# Patient Record
Sex: Male | Born: 1970 | Race: White | Hispanic: No | Marital: Single | Smoking: Never smoker
Health system: Southern US, Community
[De-identification: ages and names within clinical notes are randomized; demographics above are authoritative.]

## PROBLEM LIST (undated history)

## (undated) DIAGNOSIS — Z9889 Other specified postprocedural states: Secondary | ICD-10-CM

## (undated) DIAGNOSIS — I483 Typical atrial flutter: Secondary | ICD-10-CM

## (undated) DIAGNOSIS — E785 Hyperlipidemia, unspecified: Secondary | ICD-10-CM

## (undated) DIAGNOSIS — Z8719 Personal history of other diseases of the digestive system: Secondary | ICD-10-CM

## (undated) DIAGNOSIS — I442 Atrioventricular block, complete: Secondary | ICD-10-CM

## (undated) DIAGNOSIS — I5022 Chronic systolic (congestive) heart failure: Secondary | ICD-10-CM

## (undated) DIAGNOSIS — R001 Bradycardia, unspecified: Secondary | ICD-10-CM

## (undated) HISTORY — DX: Other specified postprocedural states: Z98.890

## (undated) HISTORY — DX: Bradycardia, unspecified: R00.1

## (undated) HISTORY — DX: Typical atrial flutter: I48.3

## (undated) HISTORY — DX: Personal history of other diseases of the digestive system: Z87.19

---

## 2010-07-16 HISTORY — PX: THROAT SURGERY: SHX803

## 2010-08-19 ENCOUNTER — Emergency Department (HOSPITAL_COMMUNITY)
Admission: EM | Admit: 2010-08-19 | Discharge: 2010-08-19 | Disposition: A | Discharge status: Other Acute Inpatient Hospital | Payer: BC Managed Care – PPO | Attending: Emergency Medicine | Admitting: Emergency Medicine

## 2010-08-19 ENCOUNTER — Inpatient Hospital Stay (HOSPITAL_COMMUNITY)
Admit: 2010-08-19 | Discharge: 2010-08-21 | DRG: 188 | Disposition: A | Discharge status: Home / Self Care | Payer: BC Managed Care – PPO | Attending: Cardiology | Admitting: Cardiology

## 2010-08-19 ENCOUNTER — Emergency Department (HOSPITAL_COMMUNITY): Payer: BC Managed Care – PPO

## 2010-08-19 ENCOUNTER — Inpatient Hospital Stay: Admission: EM | Admit: 2010-08-19 | Payer: Self-pay | Admitting: Cardiology

## 2010-08-19 DIAGNOSIS — K222 Esophageal obstruction: Secondary | ICD-10-CM | POA: Diagnosis present

## 2010-08-19 DIAGNOSIS — T18108A Unspecified foreign body in esophagus causing other injury, initial encounter: Principal | ICD-10-CM | POA: Diagnosis present

## 2010-08-19 DIAGNOSIS — R1310 Dysphagia, unspecified: Secondary | ICD-10-CM

## 2010-08-19 DIAGNOSIS — I498 Other specified cardiac arrhythmias: Secondary | ICD-10-CM | POA: Diagnosis present

## 2010-08-19 DIAGNOSIS — I4892 Unspecified atrial flutter: Secondary | ICD-10-CM | POA: Insufficient documentation

## 2010-08-19 DIAGNOSIS — Z8249 Family history of ischemic heart disease and other diseases of the circulatory system: Secondary | ICD-10-CM

## 2010-08-19 DIAGNOSIS — IMO0002 Reserved for concepts with insufficient information to code with codable children: Secondary | ICD-10-CM | POA: Insufficient documentation

## 2010-08-19 DIAGNOSIS — Y92009 Unspecified place in unspecified non-institutional (private) residence as the place of occurrence of the external cause: Secondary | ICD-10-CM

## 2010-08-19 LAB — COMPREHENSIVE METABOLIC PANEL
BUN: 19 mg/dL (ref 6–23)
CO2: 29 mEq/L (ref 19–32)
Calcium: 9.9 mg/dL (ref 8.4–10.5)
Creatinine, Ser: 1.21 mg/dL (ref 0.4–1.5)
GFR calc non Af Amer: >60 mL/min (ref >60)
Glucose, Bld: 83 mg/dL (ref 70–99)
Total Protein: 7.8 g/dL (ref 6.0–8.3)

## 2010-08-19 LAB — DIFFERENTIAL
Basophils Absolute: 0 10*3/uL (ref 0.0–0.1)
Basophils Relative: 0 % (ref 0–1)
Eosinophils Relative: 2 % (ref 0–5)
Lymphocytes Relative: 25 % (ref 12–46)
Monocytes Absolute: 0.5 10*3/uL (ref 0.1–1.0)
Monocytes Relative: 6 % (ref 3–12)
Neutro Abs: 5.7 10*3/uL (ref 1.7–7.7)

## 2010-08-19 LAB — APTT: aPTT: 31 seconds (ref 24–37)

## 2010-08-19 LAB — TROPONIN I: Troponin I: 0.04 ng/mL (ref 0.00–0.06)

## 2010-08-19 LAB — LIPASE, BLOOD: Lipase: 27 U/L (ref 11–59)

## 2010-08-19 LAB — CBC
HCT: 47 % (ref 39.0–52.0)
Hemoglobin: 16.5 g/dL (ref 13.0–17.0)
MCH: 30.8 pg (ref 26.0–34.0)
MCHC: 35.1 g/dL (ref 30.0–36.0)
RDW: 12.5 % (ref 11.5–15.5)

## 2010-08-19 LAB — CK TOTAL AND CKMB (NOT AT ARMC): Total CK: 226 U/L (ref 7–232)

## 2010-08-19 LAB — PROTIME-INR
INR: 1.03 (ref 0.00–1.49)
Prothrombin Time: 13.7 seconds (ref 11.6–15.2)

## 2010-08-20 ENCOUNTER — Encounter: Payer: Self-pay | Admitting: Cardiology

## 2010-08-20 DIAGNOSIS — K222 Esophageal obstruction: Secondary | ICD-10-CM

## 2010-08-20 DIAGNOSIS — T18108A Unspecified foreign body in esophagus causing other injury, initial encounter: Secondary | ICD-10-CM

## 2010-08-20 DIAGNOSIS — I517 Cardiomegaly: Secondary | ICD-10-CM

## 2010-08-20 DIAGNOSIS — R1310 Dysphagia, unspecified: Secondary | ICD-10-CM

## 2010-08-20 LAB — CARDIAC PANEL(CRET KIN+CKTOT+MB+TROPI)
CK, MB: 9.2 ng/mL (ref 0.3–4.0)
CK, MB: 7.9 ng/mL (ref 0.3–4.0)
Relative Index: 3.7 — ABNORMAL HIGH (ref 0.0–2.5)
Total CK: 213 U/L (ref 7–232)

## 2010-08-20 LAB — LIPID PANEL
Cholesterol: 179 mg/dL (ref 0–200)
HDL: 45 mg/dL (ref >39)
LDL Cholesterol: 128 mg/dL — ABNORMAL HIGH (ref 0–99)
Total CHOL/HDL Ratio: 4 RATIO
Triglycerides: 31 mg/dL (ref <150)

## 2010-08-21 NOTE — Assessment & Plan Note (Signed)
[facility]                                ON-CALL NOTE  [name], [name]                         MRN:          [id number] DATE:08/19/2010                            DOB:          27-Dec-1970   Ricardo Jones is not a patient in the practice, but called at the recommendation of his girlfriend.  He has a history of recurrent dysphagia and what sounds like food impactions.  Over the last few hours, he has been unable to eat or drink anything suggestive of recurrent impaction.  He was instructed to go to the emergency room where he can be evaluated.    Barbette Hair. Arlyce Dice, MD,FACG    RDK/MedQ  DD: 08/19/2010  DT: 08/20/2010  Job #: [id number]

## 2010-08-26 NOTE — H&P (Signed)
NAME:  MARRIS, FRONTERA NO.:  [id number]  MEDICAL RECORD NO.:  [id number]           PATIENT TYPE:  E  LOCATION:  WLED                         FACILITY:  [facility]  PHYSICIAN:  Luis Abed, MD, FACCDATE OF BIRTH:  [birth year]  DATE OF ADMISSION:  08/19/2010 DATE OF DISCHARGE:                             HISTORY & PHYSICAL   HISTORY OF PRESENT ILLNESS:  The patient is a healthy-appearing 40 year old gentleman.  He has a strong family history of coronary artery disease.  He has never had significant chest pain.  He exercises on a very regular basis with a vigorous program.  He has had some problems with swallowing.  He came to the emergency room today with dysphagia. Plans were being made for him to have endoscopy.  It was then noted that he had atrial flutter with a slow rate.  Decision was made to ask me to evaluate him first.  He does have atrial flutter with a slow rate.  In addition, he has inferior Q-waves and anterior Q-waves.  It is possible that he has had inferior and anterior MI in an unknown time.  He is clinically stable at this time.  ALLERGIES:  No known drug allergies.  MEDICATIONS:  He is on no significant medications.  SOCIAL HISTORY:  The patient does not smoke.  FAMILY HISTORY:  There is a strong family history of coronary artery disease.  REVIEW OF SYSTEMS:  The patient denies fever, chills, headache, sweats, rash, change in vision, change in hearing, chest pain, cough, nausea, vomiting, or urinary symptoms.  All other systems are reviewed and are negative.  PHYSICAL EXAMINATION:  GENERAL:  The patient is very healthy appearing. VITAL SIGNS:  Blood pressure is 134/72.  Pulse is 66 with a respirations 18. HEENT:  Head is atraumatic.  There is no xanthelasma. NECK:  There are no carotid bruits.  There is no jugular venous distention. LUNGS:  Clear.  Respiratory effort is not labored. CARDIAC EXAM:  Reveals S1 and S2.  There is a 2/6  systolic murmur. GASTROINTESTINAL:  The abdomen is soft. EXTREMITIES:  There is no peripheral edema.  LABORATORY DATA:  EKG reveals atrial flutter with an extremely low rate of 31.  He has inferior Q-waves and anterior Q-waves.  This must be read as cannot rule out, possible old inferior and anterior infarcts.  At this time, blood work is pending.  Chest x-ray also is pending.  PROBLEMS: 1. History of dysphagia.  He needs to have endoscopy at some point,     but he is stable.  This can be put off for today. 2. Atrial flutter with very slow rate.  The patient has always had a     slow rate and probably tolerates this rhythm.  He will need further     electrophysiologic evaluation. 3. 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  He needs 2-D echocardiogram to assess his     LV function.  PLAN:  Considering all the issues at hand, it is very difficult to decide how to proceed with which  procedure first.  It is necessary for him to have an endoscopy at some point.  I am not comfortable with proceeding with this, until we know more about his cardiac status. Therefore, he will be admitted and monitored.  His myocardial infarction will be ruled out.  He will have a 2-D echocardiogram.  He may well need to have cardiac catheterization.  As we are doing these studies, we will integrate the rest of his GI workup.     Luis Abed, MD, [facility]     JDK/MEDQ  D:  08/19/2010  T:  08/19/2010  Job:  [id number]  Electronically Signed by Willa Rough MD FACC on 08/21/2010 01:49:06 PM

## 2010-08-30 NOTE — Consult Note (Signed)
NAME:  [name], [name] NO.:  [id number]  MEDICAL RECORD NO.:  [id number]           PATIENT TYPE:  LOCATION:                                 FACILITY:  PHYSICIAN:  Hillis Range, MD       DATE OF BIRTH:  03-06-1971  DATE OF CONSULTATION: DATE OF DISCHARGE:                                CONSULTATION   REQUESTING PHYSICIAN:  Luis Abed, MD, FACC  REASON FOR CONSULTATION:  Bradycardia and atrial flutter.  HISTORY OF PRESENT ILLNESS:  Ricardo Jones is a pleasant 40 year old gentleman who is admitted with GI impaction.  He states that he has had dysphagia for some time.  He developed progressive to food impaction requiring admission to [facility] on August 19, 2010.  He was evaluated by GI and found to have a stricture at the gastroesophageal junction which was ballooned and dilated.  Interestingly, upon initial presentation, the patient was noted to have bradycardia with heart rates in the 30s.  An EKG at that time revealed atrial flutter.  The patient was also noted to have an anteroseptal and inferior infarction pattern on EKG.  He is therefore admitted to the Cardiology Service and evaluated.  The patient had an echocardiogram performed on August 20, 2010, which revealed a normal left ventricular ejection fraction with no wall motion abnormalities.  There were no significant valvular abnormalities.  The left atrial size was 43 mm.  The right ventricle was not well seen.  The patient was observed on telemetry to have persistent atrial flutter with heart rates predominantly in the 30s.  With ambulation, the patient's heart rate quickly increased into the 60s and 70s.  The patient reports having longstanding low heart rate.  He feels that his heart has always been "slow."  He has not had prior EKGs pain per his knowledge.  The patient is very active.  He exercises 5 days a week without limitation.  He denies chest pain, palpitations,  presyncope, syncope, dizziness, or fatigue.  He is otherwise without complaint at this time.  PAST MEDICAL HISTORY:  Esophageal stricture as above.  MEDICATIONS:  None known.  ALLERGIES:  None.  SOCIAL HISTORY:  The patient denies tobacco use.  He is self-employed. He drinks occasional alcohol.  FAMILY HISTORY:  The patient's mother had initially atrial flutter and subsequently required a pacemaker following atrial flutter ablation.  He now has atrial fibrillation.  The patient has a maternal uncle who also has atrial arrhythmias and a pacemaker.  He has multiple cousins who have required pacemaker implantation in their 30s.  REVIEW OF SYSTEMS:  All systems are reviewed and negative except as outlined in the HPI above.  PHYSICAL EXAMINATION:  TELEMETRY:  Atrial flutter with ventricular rates in the 30s-40s. VITAL SIGNS:  Blood pressure 104/54, heart rate 29, respirations 18, sats 98% on room air, and afebrile. GENERAL:  The patient is a fit and well-appearing male in no acute distress.  He is ambulating in the halls without difficulty.  He is alert and oriented x3. HEENT:  Normocephalic and atraumatic.  Sclerae are clear.  Conjunctivae are  pink.  Oropharynx is clear. NECK:  Supple.  No thyromegaly, JVD, or bruits. LUNGS:  Clear to auscultation bilaterally. HEART:  Bradycardic, regular rhythm.  No murmurs, rubs, or gallops. GI:  soft, nontender, and nondistended with positive bowel sounds. EXTREMITIES:  No clubbing, cyanosis, or edema. SKIN:  No ecchymoses or lacerations. MUSCULOSKELETAL:  No deformity or atrophy. PSYCH:  Euthymic mood.  Full affect.  EKG reveals typical-appearing atrial flutter with a ventricular rate of 28 beats per minute.  An inferior and anteroseptal infarction pattern is observed.  The QRS duration is 90 milliseconds.  LABORATORY DATA:  White blood cell count 8.5, hematocrit 47, and platelets 185.  TSH 1.3, potassium 5.1, and creatinine 1.2.   Cardiac markers are negative.  IMPRESSION:  Ricardo Jones is a pleasant 40 year old gentleman who presents with a symptomatic esophageal stricture which has now been dilated.  He has also been found to have asymptomatic atrial flutter with bradycardia.  He has a family history which is suggestive of hereditary atrial arrhythmias and bradycardia.  Interestingly, the patient is completely asymptomatic and is ambulating in the halls with an appropriate acceleration in heart rate.  PLAN:  Therapeutic strategies for atrial flutter and also bradycardia were discussed at length with the patient today.  As he is asymptomatic and has a CHADS-VASc score of 0, I do not feel that medical therapy or ablation is required for his atrial flutter at this time.  He is also asymptomatic with atrial flutter and therefore we will follow the patient closely, but not going to proceed with pacing at this time.  The patient would prefer this conservative approach which I think is reasonable.  As his CHADS-VASc score is 0 the recommendation would be either be for nothing, possibly aspirin long-term.  Given his recent GI dilatation procedure, I think that we should hold off on aspirin.  When I see him back in the office, we may consider aspirin as an option at that time.  The patient will notify me immediately should he have any episodes of symptomatic bradycardia or syncope.     Hillis Range, MD     JA/MEDQ  D:  08/21/2010  T:  08/21/2010  Job:  [id number]  Electronically Signed by Hillis Range MD on 08/30/2010 10:22:05 PM

## 2010-09-20 ENCOUNTER — Encounter: Payer: BC Managed Care – PPO | Admitting: Internal Medicine

## 2010-09-26 NOTE — Procedures (Signed)
Summary: EGD  EGD   Imported By: Kassie Mends 09/20/2010 08:13:43  _____________________________________________________________________  External Attachment:    Type:   Image     Comment:   External Document

## 2010-09-28 ENCOUNTER — Encounter: Payer: Self-pay | Admitting: Internal Medicine

## 2010-09-28 ENCOUNTER — Encounter (INDEPENDENT_AMBULATORY_CARE_PROVIDER_SITE_OTHER): Payer: BC Managed Care – PPO | Admitting: Internal Medicine

## 2010-09-28 DIAGNOSIS — I495 Sick sinus syndrome: Secondary | ICD-10-CM

## 2010-09-28 DIAGNOSIS — I4892 Unspecified atrial flutter: Secondary | ICD-10-CM

## 2010-09-28 DIAGNOSIS — I498 Other specified cardiac arrhythmias: Secondary | ICD-10-CM | POA: Insufficient documentation

## 2010-09-29 NOTE — Discharge Summary (Signed)
NAME:  Ricardo Jones, Ricardo Jones                ACCOUNT NO.:  [id number]  MEDICAL RECORD NO.:  [id number]           PATIENT TYPE:  I  LOCATION:  3730                         FACILITY:  [facility]  PHYSICIAN:  Hillis Range, MD       DATE OF BIRTH:  Jul 30, 1970  DATE OF ADMISSION:  08/19/2010 DATE OF DISCHARGE:  08/21/2010                              DISCHARGE SUMMARY   PROCEDURES: 1. EGD. 2. Two-view chest x-ray.  PRIMARY FINAL DISCHARGE DIAGNOSES: 1. Meat bolus in the distal esophagus. 2. Stricture at the gastroesophageal junction.  SECONDARY DIAGNOSES: 1. Atrial flutter with slow ventricular response. 2. Preserved left ventricular function with atrial dilatation,     ejection fraction 60% and left atrium of 43 mm in diameter by     echocardiogram this admission. 3. Strong family history of coronary artery disease.  TIME AT DISCHARGE:  Thiry six minutes.  HOSPITAL COURSE:  Ricardo Jones is a 40 year old male with no previous cardiac issues.  He has trouble swallowing and was unable to even swallow liquids.  He came to the emergency room.  He was seen by GI and found to have a food impaction.  When his vital signs were taken, his heart rate was noted to be in the 20s.  Cardiology was asked to evaluate him prior to him being sedated.  Ricardo Jones was evaluated by Dr. Myrtis Ser and had a stat echocardiogram. His EF was normal and his left atrium was slightly enlarged.  The patient was asymptomatic with a low heart rate.  He was cleared by Dr. Myrtis Ser to have the EGD.  He required EGD with foreign body removal and dilatation.  Dr. Arlyce Dice recommended that he continue on the PPI and get dilatations in the future p.r.n.  On August 21, 2010, Ricardo Jones was eating and drinking normally.  He was seen by EP to come up with a further plan for the atrial flutter.  Dr. Johney Frame evaluated the patient and felt that he did not have any symptoms from the atrial flutter or from the bradycardia.  He was ambulating  briskly and has a history of exercising strenuously with no symptoms.  When he ambulated briskly, his heart rate increased into the 60s.  His CHADS-VASC score was zero. The therapies for atrial flutter and bradycardia were discussed at length and Ricardo Jones prefers conservative management.  Dr. Johney Frame felt that if he were cleared per GI by discharge there were no cardiac issues to keep him in the hospital and he could be safely discharged home, to follow up as an outpatient.  Aspirin can be considered at his outpatient visit.  DISCHARGE INSTRUCTIONS:  His activity level is to be increased gradually.  He is encouraged to stick to a low-sodium, heart-healthy diet, take small bites and chew thoroughly.  He is to follow up with Dr. Arlyce Dice as needed.  He is to follow up with Dr. Johney Frame on September 20, 2010, at 9:45.  DISCHARGE MEDICATIONS:  Protonix 40 mg daily.     Theodore Demark, PA-C   ______________________________ Hillis Range, MD    RB/MEDQ  D:  08/21/2010  T:  08/21/2010  Job:  [id number]  Electronically Signed by Theodore Demark PA-C on 09/01/2010 11:59:50 AM Electronically Signed by Hillis Range MD on 09/29/2010 10:50:36 PM

## 2010-10-03 NOTE — Assessment & Plan Note (Signed)
Summary: eph/F4W/per Bjorn Loser pa/mj   Visit Type:  Follow-up   History of Present Illness: The patient presents today for routine electrophysiology followup. He reports doing very well since his recent hospital discharge.  He was admitted with upper GI food impaction requiring dilitation.  He was found to have atrial flutter with V rates 30s-40s without sypmtoms.  He reports "always having a slow heart beat".  He remains incredibly active.  Since hospital discharge, he has monitored his HR with exercise and finds that it will increase to 90s and occasionally 120s.  He does not appear to have exercise limitation and does symptoms of atrial flutter.  The patient denies symptoms of palpitations, chest pain, shortness of breath, orthopnea, PND, lower extremity edema, dizziness, presyncope, syncope, or neurologic sequela. The patient is otherwise without complaint today.   Current Medications (verified): 1)  None  Allergies (verified): No Known Drug Allergies  Past History:  Past Medical History:  1. Atrial flutter (typical) with slow ventricular response.   2. Preserved left ventricular function with atrial dilatation, ejection fraction 60% and left atrium of 43 mm in diameter by echocardiogram   3. Esophageal striction s/p dilitation  Family History:  The patient's mother had initially atrial flutter and subsequently required a pacemaker following atrial flutter ablation.  She   now has atrial fibrillation.  The patient has a maternal uncle who also has atrial arrhythmias and a pacemaker.  He has multiple cousins who have required pacemaker implantation in their 30s.   Social History: The patient denies tobacco use.  He is self-employed.   He drinks occasional alcohol.  Vital Signs:  Patient profile:   40 year old male Height:      78 inches Weight:      242 pounds BMI:     28.07 Pulse rate:   35 / minute BP sitting:   130 / 70  (left arm)  Vitals Entered By: Laurance Flatten CMA (September 28, 2010 3:28 PM)  Physical Exam  General:  Well developed, well nourished, in no acute distress. very fit Head:  normocephalic and atraumatic Eyes:  PERRLA/EOM intact; conjunctiva and lids normal. Mouth:  Teeth, gums and palate normal. Oral mucosa normal. Neck:  Neck supple, no JVD. No masses, thyromegaly or abnormal cervical nodes. Lungs:  Clear bilaterally to auscultation and percussion. Heart:  bradycardic irregular rhythm, no m/r/g Abdomen:  Bowel sounds positive; abdomen soft and non-tender without masses, organomegaly, or hernias noted. No hepatosplenomegaly. Msk:  Back normal, normal gait. Muscle strength and tone normal. Extremities:  No clubbing or cyanosis. Neurologic:  Alert and oriented x 3.   EKG  Procedure date:  09/28/2010  Findings:      typical appearing atrial flutter, V rates 35, septal infarction, septal infarction  Impression & Recommendations:  Problem # 1:  ATRIAL FLUTTER (ICD-427.32) The patient has typical appearing atrial flutter but without any symptoms.  His CHADSVASC score is 0.  He will try ASA 81mg  daily for stroke prevention.  He is clear that he wishes to avoid ablation.  As he is asymptomatic, this is a reasonable approach.  Problem # 2:  BRADYCARDIA, CHRONIC (ICD-427.89) The patient has longstanding bradycardia without symptoms.  He is able to mount a reasonable heart rate response to exercise.  I suspect that his bradycardia is most likely due to very high vegal tone, though he reports multiple maternal family memebers which have required pacemakers.  He may therefore have intrinsic conduction disease.  We will follow for  now as he is completely asymptomatic and able to mount heart rates with exercise, though eventually, he will likely require PPM.  Patient Instructions: 1)  Your physician wants you to follow-up in: 12 months with Dr Jacquiline Doe will receive a reminder letter in the mail two months in advance. If you don't receive a letter, please  call our office to schedule the follow-up appointment. 2)  Your physician recommends that you continue on your current medications as directed. Please refer to the Current Medication list given to you today.

## 2011-01-31 ENCOUNTER — Encounter: Payer: Self-pay | Admitting: Gastroenterology

## 2011-03-21 ENCOUNTER — Ambulatory Visit: Payer: BC Managed Care – PPO | Admitting: Gastroenterology

## 2011-10-22 ENCOUNTER — Ambulatory Visit: Payer: BC Managed Care – PPO

## 2011-11-08 ENCOUNTER — Ambulatory Visit (INDEPENDENT_AMBULATORY_CARE_PROVIDER_SITE_OTHER): Payer: BC Managed Care – PPO | Admitting: Internal Medicine

## 2011-11-08 ENCOUNTER — Encounter: Payer: Self-pay | Admitting: Internal Medicine

## 2011-11-08 VITALS — BP 130/72 | HR 32 | Resp 18 | Ht 78.0 in | Wt 250.1 lb

## 2011-11-08 DIAGNOSIS — I4892 Unspecified atrial flutter: Secondary | ICD-10-CM

## 2011-11-08 DIAGNOSIS — I498 Other specified cardiac arrhythmias: Secondary | ICD-10-CM

## 2011-11-08 NOTE — Assessment & Plan Note (Signed)
Asymptomatic CHADSVASC score is 0. He declines ablation and is well rate controlled No changes today

## 2011-11-08 NOTE — Patient Instructions (Signed)
Your physician wants you to follow-up in: 12 months with Dr Allred You will receive a reminder letter in the mail two months in advance. If you don't receive a letter, please call our office to schedule the follow-up appointment.   Your physician has requested that you have an echocardiogram. Echocardiography is a painless test that uses sound waves to create images of your heart. It provides your doctor with information about the size and shape of your heart and how well your heart's chambers and valves are working. This procedure takes approximately one hour. There are no restrictions for this procedure.     

## 2011-11-08 NOTE — Assessment & Plan Note (Signed)
Asymptomatic Heart rate adequately increases with activity. He is clear that he does not want further intervention  We will repeat an echo at this time. He will contact my office if he develops any symptoms.

## 2011-11-08 NOTE — Progress Notes (Signed)
  The patient presents today for routine electrophysiology followup.  Since last being seen in our clinic, the patient reports doing very well.  He remains very active and continues to rigorously exercise without limitation.  He is asymptomatic with atrial flutter and bradycardia.  Today, he denies symptoms of palpitations, chest pain, shortness of breath, lower extremity edema, dizziness, presyncope, syncope, or neurologic sequela.   Past Medical History  Diagnosis Date  . Typical atrial flutter   . Bradycardia     asymptomatic  . S/P dilatation of esophageal stricture    Medicines- none  No Known Allergies  History   Social History  . Marital Status: Single    Spouse Name: N/A    Number of Children: N/A  . Years of Education: N/A   Occupational History  . Not on file.   Social History Main Topics  . Smoking status: Never Smoker   . Smokeless tobacco: Not on file  . Alcohol Use: No  . Drug Use: No  . Sexually Active: Not on file   Other Topics Concern  . 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 Bradycardia      multiple family members with pacemakers   Physical Exam: Filed Vitals:   11/08/11 1433  BP: 130/72  Pulse: 32  Resp: 18  Height: 6\' 6"  (1.981 m)  Weight: 250 lb 1.9 oz (113.454 kg)    GEN- The patient is well appearing, alert and oriented x 3 today.   Head- normocephalic, atraumatic Eyes-  Sclera clear, conjunctiva pink Ears- hearing intact Oropharynx- clear Lungs- Clear to ausculation bilaterally, normal work of breathing Heart- bradycardic regular rhythm, no murmurs, rubs or gallops, PMI not laterally displaced GI- soft, NT, ND, + BS Extremities- no clubbing, cyanosis, or edema  ekg today reveals typical appearing atrial flutter, V rate 32 bpm, poor R wave progression  Assessment and Plan:

## 2011-11-19 ENCOUNTER — Ambulatory Visit (HOSPITAL_COMMUNITY): Payer: BC Managed Care – PPO

## 2011-11-27 ENCOUNTER — Other Ambulatory Visit (HOSPITAL_COMMUNITY): Payer: BC Managed Care – PPO

## 2011-12-03 ENCOUNTER — Telehealth: Payer: Self-pay | Admitting: *Deleted

## 2011-12-03 NOTE — Telephone Encounter (Signed)
Called Mr. Ricardo Jones to r/s echo appointment that he resheduled/no-showed on 11/19/11 2 10;30 and 11/27/11 2 10;30. Patient stated he just got married over the weekend and he will be out-of-tpwn, until 01/16/12. He would like for me to call him back on 01/16/12. sl

## 2012-04-01 ENCOUNTER — Other Ambulatory Visit: Payer: Self-pay | Admitting: *Deleted

## 2012-04-01 DIAGNOSIS — C9000 Multiple myeloma not having achieved remission: Secondary | ICD-10-CM

## 2012-04-01 DIAGNOSIS — Z09 Encounter for follow-up examination after completed treatment for conditions other than malignant neoplasm: Secondary | ICD-10-CM

## 2012-04-02 ENCOUNTER — Telehealth: Payer: Self-pay | Admitting: *Deleted

## 2012-04-02 NOTE — Telephone Encounter (Signed)
Per central scheduling to orders needs to be put back into the system

## 2012-04-02 NOTE — Telephone Encounter (Signed)
Error in charting.

## 2012-11-12 ENCOUNTER — Encounter: Payer: Self-pay | Admitting: Internal Medicine

## 2012-11-12 ENCOUNTER — Ambulatory Visit (INDEPENDENT_AMBULATORY_CARE_PROVIDER_SITE_OTHER): Payer: BC Managed Care – PPO | Admitting: Internal Medicine

## 2012-11-12 VITALS — BP 108/82 | HR 40 | Ht 79.0 in | Wt 254.2 lb

## 2012-11-12 DIAGNOSIS — I4892 Unspecified atrial flutter: Secondary | ICD-10-CM

## 2012-11-12 DIAGNOSIS — I498 Other specified cardiac arrhythmias: Secondary | ICD-10-CM

## 2012-11-12 NOTE — Patient Instructions (Addendum)
Your physician wants you to follow-up in: 12 months with Dr Allred You will receive a reminder letter in the mail two months in advance. If you don't receive a letter, please call our office to schedule the follow-up appointment.  

## 2012-11-12 NOTE — Progress Notes (Signed)
  The patient presents today for routine electrophysiology followup.  Since last being seen in our clinic, the patient reports doing very well.  He remains very active and continues to rigorously exercise without limitation.  He is asymptomatic with atrial flutter and bradycardia.  Today, he denies symptoms of palpitations, chest pain, shortness of breath, lower extremity edema, dizziness, presyncope, syncope, or neurologic sequela.   Past Medical History  Diagnosis Date  . Typical atrial flutter   . Bradycardia     asymptomatic  . S/P dilatation of esophageal stricture    Medicines- none  No Known Allergies  History   Social History  . Marital Status: Single    Spouse Name: N/A    Number of Children: N/A  . Years of Education: N/A   Occupational History  . Not on file.   Social History Main Topics  . Smoking status: Never Smoker   . Smokeless tobacco: Not on file  . Alcohol Use: No  . Drug Use: No  . Sexually Active: Not on file   Other Topics Concern  . 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 Bradycardia      multiple family members with pacemakers   Physical Exam: Filed Vitals:   11/12/12 1023  BP: 108/82  Pulse: 40  Height: 6\' 7"  (2.007 m)  Weight: 254 lb 3.2 oz (115.304 kg)    GEN- The patient is well appearing, alert and oriented x 3 today.   Head- normocephalic, atraumatic Eyes-  Sclera clear, conjunctiva pink Ears- hearing intact Oropharynx- clear Lungs- Clear to ausculation bilaterally, normal work of breathing Heart- bradycardic regular rhythm, no murmurs, rubs or gallops, PMI not laterally displaced GI- soft, NT, ND, + BS Extremities- no clubbing, cyanosis, or edema  ekg today reveals typical appearing atrial flutter, V rate 34 bpm, poor R wave progression  Assessment and Plan:  1. Atrial  flutter Asymptomatic chads2vasc score is 0 No changes at this time  2. Bradycardia Asymptomatic His heart rates increases with activity appropriately No indication for pacing at this time

## 2014-01-13 ENCOUNTER — Encounter: Payer: Self-pay | Admitting: Internal Medicine

## 2014-01-13 ENCOUNTER — Ambulatory Visit (INDEPENDENT_AMBULATORY_CARE_PROVIDER_SITE_OTHER): Payer: BC Managed Care – PPO | Admitting: Internal Medicine

## 2014-01-13 VITALS — BP 116/70 | HR 33 | Ht 78.75 in | Wt 250.0 lb

## 2014-01-13 DIAGNOSIS — I4892 Unspecified atrial flutter: Secondary | ICD-10-CM

## 2014-01-13 DIAGNOSIS — I498 Other specified cardiac arrhythmias: Secondary | ICD-10-CM

## 2014-01-13 NOTE — Progress Notes (Signed)
  The patient presents today for routine electrophysiology followup.  Since last being seen in our clinic, the patient reports doing very well.  He remains very active and continues to rigorously exercise without limitation.  He is asymptomatic with atrial flutter and bradycardia.  Today, he denies symptoms of palpitations, chest pain, shortness of breath, lower extremity edema, dizziness, presyncope, syncope, or neurologic sequela.   Past Medical History  Diagnosis Date  . Typical atrial flutter   . Bradycardia     asymptomatic  . S/P dilatation of esophageal stricture    Medicines- none  No Known Allergies  History   Social History  . Marital Status: Single    Spouse Name: N/A    Number of Children: N/A  . Years of Education: N/A   Occupational History  . Not on file.   Social History Main Topics  . Smoking status: Never Smoker   . Smokeless tobacco: Not on file  . Alcohol Use: No  . Drug Use: No  . Sexual Activity: Not on file   Other Topics Concern  . 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 Bradycardia      multiple family members with pacemakers   Physical Exam: Filed Vitals:   01/13/14 0955  BP: 116/70  Pulse: 33  Height: 6' 6.75" (2 m)  Weight: 113.399 kg (250 lb)    GEN- The patient is well appearing, alert and oriented x 3 today.   Head- normocephalic, atraumatic Eyes-  Sclera clear, conjunctiva pink Ears- hearing intact Oropharynx- clear Lungs- Clear to ausculation bilaterally, normal work of breathing Heart- bradycardic regular rhythm, no murmurs, rubs or gallops, PMI not laterally displaced GI- soft, NT, ND, + BS Extremities- no clubbing, cyanosis, or edema  Ekg today reveals typical appearing atrial flutter with slow ventricular response at 33 bpm.  Assessment and Plan:  1. Atrial flutter Asymptomatic.  He  remains very clear in his decision to avoid cardioversion or procedures including ablation chads2vasc score continues at 0. Surface echocardiogram is ordered to evaluate for any changes related to his atrial arrhythmia  2. Bradycardia Asymptomatic His heart rates increases with activity appropriately No indication for pacing at this time  Recheck in one year, sooner if symptomatic.

## 2014-01-13 NOTE — Patient Instructions (Signed)
Your physician wants you to follow-up in: 12 months with Dr Vallery Ridge will receive a reminder letter in the mail two months in advance. If you don't receive a letter, please call our office to schedule the follow-up appointment.   Your physician has requested that you have an echocardiogram. Echocardiography is a painless test that uses sound waves to create images of your heart. It provides your doctor with information about the size and shape of your heart and how well your heart's chambers and valves are working. This procedure takes approximately one hour. There are no restrictions for this procedure.

## 2014-02-01 ENCOUNTER — Ambulatory Visit (HOSPITAL_COMMUNITY): Payer: BC Managed Care – PPO | Attending: Cardiovascular Disease

## 2014-02-01 DIAGNOSIS — I079 Rheumatic tricuspid valve disease, unspecified: Secondary | ICD-10-CM | POA: Insufficient documentation

## 2014-02-01 DIAGNOSIS — K222 Esophageal obstruction: Secondary | ICD-10-CM | POA: Insufficient documentation

## 2014-02-01 DIAGNOSIS — I4892 Unspecified atrial flutter: Secondary | ICD-10-CM | POA: Insufficient documentation

## 2014-02-01 DIAGNOSIS — I517 Cardiomegaly: Secondary | ICD-10-CM | POA: Insufficient documentation

## 2014-02-01 DIAGNOSIS — I498 Other specified cardiac arrhythmias: Secondary | ICD-10-CM | POA: Insufficient documentation

## 2014-02-01 NOTE — Progress Notes (Signed)
2D Echo completed. 02/01/2014  

## 2014-02-10 ENCOUNTER — Telehealth: Payer: Self-pay | Admitting: Internal Medicine

## 2014-02-10 NOTE — Telephone Encounter (Addendum)
Called patient back and was explaining his test results.  He was not happy with timing of call back for results and felt the MD should have called him.  I explained that I call all test results back to all patients and unless it is critical I call as I can to give results and plan of care.  He was not happy with the recommendations given him and hung up.  Dr Rayann Heman was present during conversation with him and felt I gave him adequate information in regards to his test results

## 2014-02-10 NOTE — Telephone Encounter (Signed)
New message    Calling for test results  

## 2014-02-11 ENCOUNTER — Encounter: Payer: Self-pay | Admitting: Internal Medicine

## 2014-03-02 ENCOUNTER — Encounter: Payer: Self-pay | Admitting: Internal Medicine

## 2014-03-30 ENCOUNTER — Ambulatory Visit (INDEPENDENT_AMBULATORY_CARE_PROVIDER_SITE_OTHER): Payer: BC Managed Care – PPO | Admitting: Cardiology

## 2014-03-30 ENCOUNTER — Encounter: Payer: Self-pay | Admitting: Cardiology

## 2014-03-30 VITALS — BP 116/72 | HR 34 | Ht 79.0 in | Wt 252.0 lb

## 2014-03-30 DIAGNOSIS — I4892 Unspecified atrial flutter: Secondary | ICD-10-CM

## 2014-03-30 NOTE — Patient Instructions (Signed)
Your physician has requested that you have an exercise tolerance test. For further information please visit [email or web address]. Please also follow instruction sheet, as given.  Your physician wants you to follow-up in: 1 year ov/ekg  You will receive a reminder letter in the mail two months in advance. If you don't receive a letter, please call our office to schedule the follow-up appointment.

## 2014-03-30 NOTE — Progress Notes (Signed)
Ricardo Jones Date of Birth:  September 29, 1970 [place] [address] [place], [place]  [address] [phone]        Fax   [phone]   History of Present Illness: This pleasant 43 year old gentleman is seen by me for the first time today.  He is essentially self-referred.  [name] is our patient.  Mr. Ricardo Jones comes in for followup of his chronic atrial flutter.  His flutter was first discovered in 2013 when he presented to the emergency room with a piece of chicken caught in his throat.  He had to have anesthesia for removal of the foreign object and his preoperative EKG was abnormal and showed atrial flutter with slow ventricular response.  This was the first time that he was told that he was in atrial flutter.  The previous duration of the flutter is unknown since he was asymptomatic.  Of interest is the fact that his mother had had atrial flutter.  She went on to have a flutter ablation and with the procedure developed complete heart block and had to have a pacemaker. The patient is asymptomatic.  He exercises regularly.  He does not have any chest pain or shortness of breath.  He has not had any dizziness or syncope.  His resting heart rate is usually in the 30s.  When he goes to the gym he can get his heart rate up to about 89. His chest last score remains at 0. He had an echocardiogram on 02/01/14 which showed an ejection fraction of 50-55%.  There was no LVH.  There was mild dilatation of the left ventricle and of the right ventricle.  There was normal right ventricular function.  There was moderate left atrial enlargement and severe right atrial enlargement.  The atrial enlargement was felt to be secondary to prolonged atrial flutter. The patient takes no medication.  In the past he has declined any consideration of medication or of flutter ablation procedures.  No current outpatient prescriptions on file.   No current facility-administered  medications for this visit.    No Known Allergies  Patient Active Problem List   Diagnosis Date Noted  . ATRIAL FLUTTER 09/28/2010  . BRADYCARDIA, CHRONIC 09/28/2010    History  Smoking status  . Never Smoker   Smokeless tobacco  . Not on file    History  Alcohol Use No    Family History  Problem Relation Age of Onset  . Bradycardia      multiple family members with pacemakers    Review of Systems: Constitutional: no fever chills diaphoresis or fatigue or change in weight.  Head and neck: no hearing loss, no epistaxis, no photophobia or visual disturbance. Respiratory: No cough, shortness of breath or wheezing. Cardiovascular: No chest pain peripheral edema, palpitations. Gastrointestinal: No abdominal distention, no abdominal pain, no change in bowel habits hematochezia or melena. Genitourinary: No dysuria, no frequency, no urgency, no nocturia. Musculoskeletal:No arthralgias, no back pain, no gait disturbance or myalgias. Neurological: No dizziness, no headaches, no numbness, no seizures, no syncope, no weakness, no tremors. Hematologic: No lymphadenopathy, no easy bruising. Psychiatric: No confusion, no hallucinations, no sleep disturbance.    Physical Exam: Filed Vitals:   03/30/14 1001  BP: 116/72  Pulse: 34  The patient appears to be in no distress.  Head and neck exam reveals that the pupils are equal and reactive.  The extraocular movements are full.  There is no scleral icterus.  Mouth and  pharynx are benign.  No lymphadenopathy.  No carotid bruits.  The jugular venous pressure is normal.  Thyroid is not enlarged or tender.  Chest is clear to percussion and auscultation.  No rales or rhonchi.  Expansion of the chest is symmetrical.  Heart reveals no abnormal lift or heave.  The pulse is slow and regular.  First and second heart sounds are normal.  There is no murmur gallop rub or click.  The abdomen is soft and nontender.  Bowel sounds are normoactive.   There is no hepatosplenomegaly or mass.  There are no abdominal bruits.  Extremities reveal no phlebitis or edema.  Pedal pulses are good.  There is no cyanosis or clubbing.  Neurologic exam is normal strength and no lateralizing weakness.  No sensory deficits.  Integument reveals no rash  EKG shows atrial flutter with 5-1 ventricular conduction and a ventricular rate of 34.  Assessment / Plan: 1. chronic atrial flutter with slow ventricular response.  No cardiac symptoms. 2. family history of atrial flutter  Recommendation: We discussed the pros and cons of possible ablation.  We talked about the possibility of subsequent necessity of a pacemaker in occasional cases following ablation.  He remains totally asymptomatic.  He would prefer not to be on medication or to have an ablation. We will have the patient return for a Bruce protocol treadmill stress test to evaluate the response of his AV node to exercise.  We will not use the usual target heart rate.  This will be a symptom limited treadmill observing response of AV node conduction to exercise.  By history he states that his heart rate goes up to about 89 which would suggest that he probably goes from a 5-1 conduction pattern to a 3-1 conduction with vigorous exercise.  Return here in one year for followup office visit and EKG, or sooner when necessary.

## 2014-06-25 ENCOUNTER — Telehealth: Payer: Self-pay | Admitting: *Deleted

## 2014-06-25 NOTE — Telephone Encounter (Signed)
Left message to call back  

## 2014-06-25 NOTE — Telephone Encounter (Signed)
-----   Message from Ricardo Jones sent at 04/01/2014  5:21 PM EDT ----- Regarding: gxt 04-01-14  Spoke with patient to schedule GXT per patient starting a new work schedule will c/b in couple of weeks to schedule/saf

## 2014-07-01 NOTE — Telephone Encounter (Signed)
Left message to call back  

## 2014-07-12 NOTE — Telephone Encounter (Signed)
Patient never returned call  

## 2015-08-01 ENCOUNTER — Encounter: Payer: Self-pay | Admitting: Cardiology

## 2015-08-01 ENCOUNTER — Ambulatory Visit (INDEPENDENT_AMBULATORY_CARE_PROVIDER_SITE_OTHER): Payer: BLUE CROSS/BLUE SHIELD | Admitting: Cardiology

## 2015-08-01 VITALS — BP 130/70 | HR 40 | Ht 78.0 in | Wt 263.0 lb

## 2015-08-01 DIAGNOSIS — I4892 Unspecified atrial flutter: Secondary | ICD-10-CM

## 2015-08-01 NOTE — Progress Notes (Signed)
Cardiology Office Note   Date:  08/01/2015   ID:  Ricardo Jones, DOB 02-13-1971, MRN [id number]  PCP:  No primary care provider on file.  Cardiologist: Darlin Coco MD  Chief Complaint  Patient presents with  . Follow-up    Atrial Flutter. Patient denies chest pain, shortness of breath, and le edema      History of Present Illness: Ricardo Jones is a 45 y.o. male who presents for follow-up visit  This pleasant 45 year old gentleman is seen by me for a follow-up visit.  We saw him previously once on 03/30/2014. He is essentially self-referred. [name] is our patient. Ricardo Jones comes in for followup of his chronic atrial flutter. His flutter was first discovered in 2013 when he presented to the emergency room with a piece of chicken caught in his throat. He had to have anesthesia for removal of the foreign object and his preoperative EKG was abnormal and showed atrial flutter with slow ventricular response. This was the first time that he was told that he was in atrial flutter. The previous duration of the flutter is unknown since he was asymptomatic. Of interest is the fact that his mother had had atrial flutter. She went on to have a flutter ablation and with the procedure developed complete heart block and had to have a pacemaker. The patient is asymptomatic. He exercises regularly. He does not have any chest pain or shortness of breath. He has not had any dizziness or syncope. His resting heart rate is usually in the 30s. When he goes to the gym he can get his heart rate up to about 89.  He had an echocardiogram on 02/01/14 which showed an ejection fraction of 50-55%. There was no LVH. There was mild dilatation of the left ventricle and of the right ventricle. There was normal right ventricular function. There was moderate left atrial enlargement and severe right atrial enlargement. The atrial enlargement was felt to be secondary to prolonged  atrial flutter. The patient takes no medication. In the past he has declined any consideration of medication or of flutter ablation procedures. Since we saw him a year and a half ago he has felt well.  He has not been experiencing any chest pain or shortness of breath.  He has not had any lightheadedness or dizziness or syncope.  He exercises regularly.  He denies cardiac exercises as well as isometrics.  His weight is up 11 pounds since last visit which she attributes to increased muscle mass. At his last visit we had set him up for a mill stress test to look at his chronotropic competence but he never came for the stress test.  Past Medical History  Diagnosis Date  . Typical atrial flutter ([place])   . Bradycardia     asymptomatic  . S/P dilatation of esophageal stricture     No past surgical history on file.   No current outpatient prescriptions on file.   No current facility-administered medications for this visit.    Allergies:   Review of patient's allergies indicates no known allergies.    Social History:  The patient  reports that he has never smoked. He does not have any smokeless tobacco history on file. He reports that he does not drink alcohol or use illicit drugs.   Family History:  The patient's family history is not on file. his mother has atrial flutter.   ROS:  Please see the history of present illness.  Otherwise, review of systems are positive for none.   All other systems are reviewed and negative.    PHYSICAL EXAM: VS:  BP 130/70 mmHg  Pulse 40  Ht 6\' 6"  (1.981 m)  Wt 263 lb (119.296 kg)  BMI 30.40 kg/m2 , BMI Body mass index is 30.4 kg/(m^2). GEN: Well nourished, well developed, in no acute distress HEENT: normal Neck: no JVD, carotid bruits, or masses Cardiac: Slow irregular pulse.; no murmurs, rubs, or gallops,no edema  Respiratory:  clear to auscultation bilaterally, normal work of breathing GI: soft, nontender, nondistended, + BS MS: no deformity  or atrophy Skin: warm and dry, no rash Neuro:  Strength and sensation are intact Psych: euthymic mood, full affect   EKG:  EKG is ordered today. The ekg ordered today demonstrates atrial flutter with slow ventricular response of 38 bpm.   Recent Labs: No results found for requested labs within last 365 days.    Lipid Panel    Component Value Date/Time   CHOL  08/20/2010 0002    179        ATP III CLASSIFICATION:  <200     mg/dL   Desirable  200-239  mg/dL   Borderline High  >=240    mg/dL   High          TRIG 31 08/20/2010 0002   HDL 45 08/20/2010 0002   CHOLHDL 4.0 08/20/2010 0002   VLDL 6 08/20/2010 0002   LDLCALC * 08/20/2010 0002    128        Total Cholesterol/HDL:CHD Risk Coronary Heart Disease Risk Table                     Men   Women  1/2 Average Risk   3.4   3.3  Average Risk       5.0   4.4  2 X Average Risk   9.6   7.1  3 X Average Risk  23.4   11.0        Use the calculated Patient Ratio above and the CHD Risk Table to determine the patient's CHD Risk.        ATP III CLASSIFICATION (LDL):  <100     mg/dL   Optimal  100-129  mg/dL   Near or Above                    Optimal  130-159  mg/dL   Borderline  160-189  mg/dL   High  >190     mg/dL   Very High      Wt Readings from Last 3 Encounters:  08/01/15 263 lb (119.296 kg)  03/30/14 252 lb (114.306 kg)  01/13/14 250 lb (113.399 kg)      ASSESSMENT AND PLAN:  1. chronic atrial flutter with slow ventricular response. No cardiac symptoms. 2. family history of atrial flutter  Recommendation: At his initial visit we discussed the pros and cons of possible ablation. We talked about the possibility of subsequent necessity of a pacemaker in occasional cases following ablation. He remains totally asymptomatic. He would prefer not to be on medication or to have an ablation.  He does not want a pacemaker This patients CHA2DS2-VASc Score is 0  Above score calculated as 1 point each if present [CHF,  HTN, DM, Vascular=MI/PAD/Aortic Plaque, Age if 65-74, or Male] Above score calculated as 2 points each if present [Age > 75, or Stroke/TIA/TE]  Disposition: We talked about the right atrial enlargement  seen on his previous echo.  We will get a chest x-ray to be sure that there has not been a significant change in his heart size since we last saw him.  Otherwise continue on no medication.  Recheck in one year with Dr. Grayland Jack   Current medicines are reviewed at length with the patient today.  The patient does not have concerns regarding medicines.  The following changes have been made:  no change  Labs/ tests ordered today include:  No orders of the defined types were placed in this encounter.      Berna Spare MD 08/01/2015 9:05 AM    [address] [place], [place], [place]  [address] Phone: [phone]; Fax: [phone]

## 2015-08-01 NOTE — Patient Instructions (Signed)
Medication Instructions:  Your physician recommends that you continue on your current medications as directed. Please refer to the Current Medication list given to you today.  Labwork: none  Testing/Procedures: A chest x-ray takes a picture of the organs and structures inside the chest, including the heart, lungs, and blood vessels. This test can show several things, including, whether the heart is enlarges; whether fluid is building up in the lungs; and whether pacemaker / defibrillator leads are still in place. GO TO [facility]  Follow-Up: Your physician wants you to follow-up in: [place] will receive a reminder letter in the mail two months in advance. If you don't receive a letter, please call our office to schedule the follow-up appointment.  If you need a refill on your cardiac medications before your next appointment, please call your pharmacy.

## 2015-08-12 ENCOUNTER — Ambulatory Visit: Payer: Self-pay | Admitting: Cardiology

## 2016-02-13 ENCOUNTER — Encounter: Payer: Self-pay | Admitting: Podiatry

## 2016-02-13 ENCOUNTER — Ambulatory Visit (INDEPENDENT_AMBULATORY_CARE_PROVIDER_SITE_OTHER): Payer: BLUE CROSS/BLUE SHIELD | Admitting: Podiatry

## 2016-02-13 DIAGNOSIS — B351 Tinea unguium: Secondary | ICD-10-CM

## 2016-02-13 NOTE — Progress Notes (Signed)
   Subjective:    Patient ID: Ricardo Jones, male    DOB: [birth year], 45 y.o.   MRN: [id number]  HPI  Chief Complaint  Patient presents with  . Nail Problem    B/L TOENAILS HAVE DISCOLORATION FOR 3 YEARS. TOENAILS ARE WORSE AND TRIED RX. LAMISIL 2X-NO HELP. ALSO, LT BOTTOM/BACK OF THE HEEL HAVE REDNESS.   45 year old male presents the office today for concerns of toenail discoloration as well as athlete's foot. He states these been on Lamisil twice and he would get somewhat better but does reoccur. He discuss other treatment options this time. Denies any pain to the toenails denies any redness or drainage.  Review of Systems  Skin: Positive for color change.       Objective:   Physical Exam General: AAO x3, NAD  Dermatological: Nails are hypertrophic, dystrophic, brittle, discolored. There is no tenderness to palpation of the nails there is no surrounding redness or drainage. There is dry, xerotic, erythematous skin on the plantar aspect of the feet consistent with tinea pedis. No drainage or open lesions are identified at this time.  Vascular: Dorsalis Pedis artery and Posterior Tibial artery pedal pulses are 2/4 bilateral with immedate capillary fill time. There is no pain with calf compression, swelling, warmth, erythema.   Neruologic: Grossly intact via light touch bilateral. Vibratory intact via tuning fork bilateral. Protective threshold with Semmes Wienstein monofilament intact to all pedal sites bilateral.   Musculoskeletal: No gross boney pedal deformities bilateral. No pain, crepitus, or limitation noted with foot and ankle range of motion bilateral. Muscular strength 5/5 in all groups tested bilateral.  Gait: Unassisted, Nonantalgic.     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 The nails were debrided today and they were sent to [facility] labs for evaluation. Discussed the medication on medication options in regards to treatments for onychomycosis. We will await the results of the culture before proceed with treatment. As discussed in other extrinsic factors second should be to fungus such as moisture to his feet. We can control these issues over this will help with this fungus as well.  Celesta Gentile, DPM

## 2016-03-02 ENCOUNTER — Telehealth: Payer: Self-pay

## 2016-03-02 NOTE — Telephone Encounter (Signed)
Spoke with pt regarding positive fungal culture results, advised him to make an appt. He stated that he would call back to schedule

## 2016-04-09 ENCOUNTER — Telehealth: Payer: Self-pay

## 2016-04-09 NOTE — Telephone Encounter (Signed)
Do you have his culture results? I cannot find them in epic

## 2016-04-09 NOTE — Telephone Encounter (Signed)
Pt called asking about various treatments for nail fungus, he had positive results from [facility]

## 2016-04-10 ENCOUNTER — Telehealth: Payer: Self-pay

## 2016-04-10 NOTE — Telephone Encounter (Signed)
Spoke with pt regarding Dr Leigh Aurora orders to write for Rx for nail laquer from [facility], if patient agrees to this therapy he is to call back and let us know.

## 2016-04-18 ENCOUNTER — Telehealth: Payer: Self-pay | Admitting: *Deleted

## 2016-04-18 MED ORDER — NONFORMULARY OR COMPOUNDED ITEM: 2 refills | Status: DC

## 2016-04-18 NOTE — Telephone Encounter (Signed)
Pt's wife, Ishmael Holter states pt has decided to use the topical fungal treatment.  Dr. Jacqualyn Posey states [facility] Onychomycosis Nail Lacquer. Faxed to [employer].

## 2016-08-23 ENCOUNTER — Encounter: Payer: Self-pay | Admitting: Cardiovascular Disease

## 2016-08-28 ENCOUNTER — Encounter: Payer: BLUE CROSS/BLUE SHIELD | Admitting: Cardiovascular Disease

## 2016-09-06 ENCOUNTER — Telehealth: Payer: Self-pay | Admitting: *Deleted

## 2016-09-06 MED ORDER — NONFORMULARY OR COMPOUNDED ITEM: 2 refills | Status: DC

## 2016-09-06 NOTE — Telephone Encounter (Addendum)
Pt's wife, Porfirio Mylar states pt needs an refill of the topical fungal nail treatment, and his athletes foot is coming back due to the warm weather and would like to use lamisil. Left message informing pt we would refill the [facility] Nail Lacquer but if he wanted to begin lamisil he would need to make an appt to discuss with Dr.Wagoner. Faxed refill orders for [facility] Onychomycosis nail Lacquer.01/09/2017-Pt states the antifungal lacquer is not effective and would like to take lamisil and use the the lacquer on occasion.01/10/2017-Left message informing pt Dr. Jacqualyn Posey wouold like him to make an appt to discuss the lamisil therapy.

## 2016-09-24 ENCOUNTER — Encounter: Payer: BLUE CROSS/BLUE SHIELD | Admitting: Cardiovascular Disease

## 2016-10-08 DIAGNOSIS — J069 Acute upper respiratory infection, unspecified: Secondary | ICD-10-CM | POA: Diagnosis not present

## 2016-11-01 ENCOUNTER — Ambulatory Visit (INDEPENDENT_AMBULATORY_CARE_PROVIDER_SITE_OTHER): Payer: BLUE CROSS/BLUE SHIELD | Admitting: Cardiovascular Disease

## 2016-11-01 ENCOUNTER — Encounter: Payer: Self-pay | Admitting: Cardiovascular Disease

## 2016-11-01 VITALS — BP 114/74 | HR 33 | Ht 79.0 in | Wt 253.4 lb

## 2016-11-01 DIAGNOSIS — I4892 Unspecified atrial flutter: Secondary | ICD-10-CM | POA: Diagnosis not present

## 2016-11-01 NOTE — Patient Instructions (Addendum)
Medication Instructions:  Your physician does not recommend that you start any new medications   Labwork: None Ordered   Testing/Procedures: Your physician has requested that you have an echocardiogram. Echocardiography is a painless test that uses sound waves to create images of your heart. It provides your doctor with information about the size and shape of your heart and how well your heart's chambers and valves are working. This procedure takes approximately one hour. There are no restrictions for this procedure.    Follow-Up: Your physician wants you to follow-up in: 1 year with Dr. Acie Fredrickson.  You will receive a reminder letter in the mail two months in advance. If you don't receive a letter, please call our office to schedule the follow-up appointment.   If you need a refill on your cardiac medications before your next appointment, please call your pharmacy.   Thank you for choosing [facility]! Christen Bame, RN [phone]

## 2016-11-01 NOTE — Progress Notes (Signed)
Cardiology Office Note   Date:  11/01/2016   ID:  Ricardo Jones, DOB 01-Mar-1971, MRN [id number]  PCP:  No PCP Per Patient  Cardiologist: Ricardo Coco MD  ---> Ricardo Jones   Chief Complaint  Patient presents with  . Atrial Flutter      previouis notes from Dr. Domenic Jones I Ricardo Jones is a 46 y.o. male who presents for follow-up visit  This pleasant 46 year old gentleman is seen by me for a follow-up visit.  We saw him previously once on 03/30/2014. He is essentially self-referred. [name] is our patient. Ricardo Jones comes in for followup of his chronic atrial flutter. His flutter was first discovered in 2013 when he presented to the emergency room with a piece of chicken caught in his throat. He had to have anesthesia for removal of the foreign object and his preoperative EKG was abnormal and showed atrial flutter with slow ventricular response. This was the first time that he was told that he was in atrial flutter. The previous duration of the flutter is unknown since he was asymptomatic. Of interest is the fact that his mother had had atrial flutter. She went on to have a flutter ablation and with the procedure developed complete heart block and had to have a pacemaker. The patient is asymptomatic. He exercises regularly. He does not have any chest pain or shortness of breath. He has not had any dizziness or syncope. His resting heart rate is usually in the 30s. When he goes to the gym he can get his heart rate up to about 89.  He had an echocardiogram on 02/01/14 which showed an ejection fraction of 50-55%. There was no LVH. There was mild dilatation of the left ventricle and of the right ventricle. There was normal right ventricular function. There was moderate left atrial enlargement and severe right atrial enlargement. The atrial enlargement was felt to be secondary to prolonged atrial flutter. The patient takes no medication. In the past he has  declined any consideration of medication or of flutter ablation procedures. Since we saw him a year and a half ago he has felt well.  He has not been experiencing any chest pain or shortness of breath.  He has not had any lightheadedness or dizziness or syncope.  He exercises regularly.  He denies cardiac exercises as well as isometrics.  His weight is up 11 pounds since last visit which she attributes to increased muscle mass. At his last visit we had set him up for a mill stress test to look at his chronotropic competence but he never came for the stress test.  November 01, 2016: Ricardo Jones is seen for the firs time today - transfer from [place] with his wife, Ricardo Jones.  Able to do all of his normal activities without any CP or dyspnea. Has not been exercising as much ( 73 month old baby at home )  No syncope    Past Medical History:  Diagnosis Date  . Bradycardia    asymptomatic  . S/P dilatation of esophageal stricture   . Typical atrial flutter ([place])     History reviewed. No pertinent surgical history.   No current outpatient prescriptions on file.   No current facility-administered medications for this visit.     Allergies:   Patient has no known allergies.    Social History:  The patient  reports that he has never smoked. He has never used smokeless tobacco. He reports that he does  not drink alcohol or use drugs.   Family History:  The patient's family history is not on file. his mother has atrial flutter.   ROS:  Please see the history of present illness.   Otherwise, review of systems are positive for none.   All other systems are reviewed and negative.    PHYSICAL EXAM: VS:  BP 114/74   Pulse (!) 33   Ht 6\' 7"  (2.007 m)   Wt 253 lb 6.4 oz (114.9 kg)   BMI 28.55 kg/m  , BMI Body mass index is 28.55 kg/m. GEN: Well nourished, well developed, in no acute distress  HEENT: normal  Neck: no JVD, carotid bruits, or masses Cardiac: Slow irregular pulse.; no murmurs,  rubs, or gallops,no edema  Respiratory:  clear to auscultation bilaterally, normal work of breathing GI: soft, nontender, nondistended, + BS MS: no deformity or atrophy  Skin: warm and dry, no rash Neuro:  Strength and sensation are intact Psych: euthymic mood, full affect   EKG:  EKG is ordered today. The ekg ordered today demonstrates atrial flutter with slow ventricular response of 33 bpm.   Recent Labs: No results found for requested labs within last 8760 hours.    Lipid Panel    Component Value Date/Time   CHOL  08/20/2010 0002    179        ATP III CLASSIFICATION:  <200     mg/dL   Desirable  200-239  mg/dL   Borderline High  >=240    mg/dL   High          TRIG 31 08/20/2010 0002   HDL 45 08/20/2010 0002   CHOLHDL 4.0 08/20/2010 0002   VLDL 6 08/20/2010 0002   LDLCALC (H) 08/20/2010 0002    128        Total Cholesterol/HDL:CHD Risk Coronary Heart Disease Risk Table                     Men   Women  1/2 Average Risk   3.4   3.3  Average Risk       5.0   4.4  2 X Average Risk   9.6   7.1  3 X Average Risk  23.4   11.0        Use the calculated Patient Ratio above and the CHD Risk Table to determine the patient's CHD Risk.        ATP III CLASSIFICATION (LDL):  <100     mg/dL   Optimal  100-129  mg/dL   Near or Above                    Optimal  130-159  mg/dL   Borderline  160-189  mg/dL   High  >190     mg/dL   Very High      Wt Readings from Last 3 Encounters:  11/01/16 253 lb 6.4 oz (114.9 kg)  08/01/15 263 lb (119.3 kg)  03/30/14 252 lb (114.3 kg)      ASSESSMENT AND PLAN:  1. chronic atrial flutter with slow ventricular response. No cardiac symptoms. I did a 1 minute  Step test.  . His heart rate increased into the 90s. He has no symptoms of chest pain, shortness breath, syncope, or presyncope.  We had long discussion about the fact that his atria are dilated and that the persistent atrial flutter might continue to cause further stretching of  his atria. We'll repeat the echocardiogram  for further evaluation. We'll discuss her options after that. I'll plan on seeing him again in one year.  2. family history of atrial flutter   This patients CHA2DS2-VASc Score is 0  Above score calculated as 1 point each if present [CHF, HTN, DM, Vascular=MI/PAD/Aortic Plaque, Age if 65-74, or Male] Above score calculated as 2 points each if present [Age > 75, or Stroke/TIA/TE]

## 2016-11-15 ENCOUNTER — Ambulatory Visit (HOSPITAL_COMMUNITY): Payer: BLUE CROSS/BLUE SHIELD | Attending: Cardiovascular Disease

## 2016-11-15 ENCOUNTER — Other Ambulatory Visit: Payer: Self-pay

## 2016-11-15 DIAGNOSIS — I361 Nonrheumatic tricuspid (valve) insufficiency: Secondary | ICD-10-CM | POA: Insufficient documentation

## 2016-11-15 DIAGNOSIS — I517 Cardiomegaly: Secondary | ICD-10-CM | POA: Diagnosis not present

## 2016-11-15 DIAGNOSIS — I4892 Unspecified atrial flutter: Secondary | ICD-10-CM | POA: Diagnosis not present

## 2016-11-15 DIAGNOSIS — I420 Dilated cardiomyopathy: Secondary | ICD-10-CM | POA: Insufficient documentation

## 2016-11-15 DIAGNOSIS — I340 Nonrheumatic mitral (valve) insufficiency: Secondary | ICD-10-CM | POA: Insufficient documentation

## 2016-11-15 DIAGNOSIS — R001 Bradycardia, unspecified: Secondary | ICD-10-CM | POA: Diagnosis not present

## 2016-11-30 ENCOUNTER — Encounter: Payer: Self-pay | Admitting: Cardiovascular Disease

## 2016-11-30 NOTE — Telephone Encounter (Signed)
°  New Prob  Pt is requesting a call back regarding results and moving forward with his plan of care. Please call.

## 2016-11-30 NOTE — Telephone Encounter (Signed)
This encounter was created in error - please disregard.

## 2016-12-17 ENCOUNTER — Telehealth: Payer: Self-pay | Admitting: Nurse Practitioner

## 2016-12-17 NOTE — Telephone Encounter (Signed)
Left message for patient to call back.  I had message that patient is requesting a sooner appointment than July 2.  I called to offer him opening on Dr. Elmarie Shiley schedule for tomorrow, June 5 at 9:20.

## 2017-01-10 NOTE — Telephone Encounter (Signed)
It has been almost a year he was last seen. He will need to come in for the prescription and get blood work ordered.

## 2017-01-14 ENCOUNTER — Ambulatory Visit: Payer: BLUE CROSS/BLUE SHIELD | Admitting: Cardiovascular Disease

## 2017-01-31 ENCOUNTER — Ambulatory Visit (INDEPENDENT_AMBULATORY_CARE_PROVIDER_SITE_OTHER): Payer: BLUE CROSS/BLUE SHIELD | Admitting: Podiatry

## 2017-01-31 ENCOUNTER — Encounter: Payer: Self-pay | Admitting: Podiatry

## 2017-01-31 DIAGNOSIS — B353 Tinea pedis: Secondary | ICD-10-CM | POA: Diagnosis not present

## 2017-01-31 DIAGNOSIS — B351 Tinea unguium: Secondary | ICD-10-CM | POA: Diagnosis not present

## 2017-01-31 DIAGNOSIS — Z79899 Other long term (current) drug therapy: Secondary | ICD-10-CM

## 2017-01-31 MED ORDER — TERBINAFINE HCL 250 MG PO TABS: 250.0000 mg | ORAL_TABLET | Freq: Every day | ORAL | 0 refills | Status: DC

## 2017-01-31 MED ORDER — KETOCONAZOLE 2 % EX CREA: 1.0000 "application " | TOPICAL_CREAM | Freq: Every day | CUTANEOUS | 2 refills | Status: DC

## 2017-01-31 NOTE — Patient Instructions (Signed)

## 2017-02-01 LAB — CBC WITH DIFFERENTIAL/PLATELET
BASOS PCT: 1 %
Basophils Absolute: 56 cells/uL (ref 0–200)
Eosinophils Absolute: 168 cells/uL (ref 15–500)
Eosinophils Relative: 3 %
HEMATOCRIT: 46.1 % (ref 38.5–50.0)
HEMOGLOBIN: 15.5 g/dL (ref 13.2–17.1)
LYMPHS ABS: 2072 {cells}/uL (ref 850–3900)
LYMPHS PCT: 37 %
MCH: 29.5 pg (ref 27.0–33.0)
MCHC: 33.6 g/dL (ref 32.0–36.0)
MCV: 87.6 fL (ref 80.0–100.0)
MONO ABS: 392 {cells}/uL (ref 200–950)
MPV: 11 fL (ref 7.5–12.5)
Monocytes Relative: 7 %
Neutro Abs: 2912 cells/uL (ref 1500–7800)
Neutrophils Relative %: 52 %
Platelets: 171 10*3/uL (ref 140–400)
RBC: 5.26 MIL/uL (ref 4.20–5.80)
RDW: 14 % (ref 11.0–15.0)
WBC: 5.6 10*3/uL (ref 3.8–10.8)

## 2017-02-01 LAB — HEPATIC FUNCTION PANEL
ALT: 40 U/L (ref 9–46)
AST: 28 U/L (ref 10–40)
Albumin: 4.3 g/dL (ref 3.6–5.1)
Alkaline Phosphatase: 94 U/L (ref 40–115)
BILIRUBIN DIRECT: 0.3 mg/dL — AB (ref <=0.2)
BILIRUBIN TOTAL: 1.2 mg/dL (ref 0.2–1.2)
Indirect Bilirubin: 0.9 mg/dL (ref 0.2–1.2)
Total Protein: 6.9 g/dL (ref 6.1–8.1)

## 2017-02-04 ENCOUNTER — Telehealth: Payer: Self-pay | Admitting: *Deleted

## 2017-02-04 NOTE — Telephone Encounter (Addendum)
-----   Message from Trula Slade, DPM sent at 02/01/2017  4:53 PM EDT ----- Can start lamisil.02/04/2017-Left message informing pt of Dr. Leigh Aurora orders.

## 2017-02-07 NOTE — Progress Notes (Signed)
Subjective: Ricardo Jones presents the office they discussed nail culture results. He states that the feet are doing the same last appointment he has no new concerns. Denies any redness or drainage or any swelling to the foot. Denies any systemic complaints such as fevers, chills, nausea, vomiting. No acute changes since last appointment, and no other complaints at this time.   Objective: AAO x3, NAD DP/PT pulses palpable bilaterally, CRT less than 3 seconds Nails continue to be hypertrophic, dystrophic, brittle, discolored yellow-brown discoloration. There is no pain of the nails there is no swelling or redness or drainage. There is evidence of tinea pedis the plantar aspect of the feet. Is no open sores identified. No open lesions or pre-ulcerative lesions.  No pain with calf compression, swelling, warmth, erythema  Assessment: Onychomycosis, tinea pedis  Plan: -All treatment options discussed with the patient including all alternatives, risks, complications.  -Nail culture results were discussed the patient. At this point he has been on Lamisil previously which did help but not completely get rid of the fungus. After discussing wishes to try another round of Lamisil. This was ordered today. I also ordered blood work that he is to get done prior to starting the medication he verbally understood this. Also discussed side effects the medication. -Patient encouraged to call the office with any questions, concerns, change in symptoms.   Celesta Gentile, DPM

## 2017-03-14 ENCOUNTER — Ambulatory Visit: Payer: BLUE CROSS/BLUE SHIELD | Admitting: Podiatry

## 2017-04-02 ENCOUNTER — Encounter: Payer: Self-pay | Admitting: Podiatry

## 2017-04-02 ENCOUNTER — Ambulatory Visit (INDEPENDENT_AMBULATORY_CARE_PROVIDER_SITE_OTHER): Payer: BLUE CROSS/BLUE SHIELD | Admitting: Podiatry

## 2017-04-02 DIAGNOSIS — B351 Tinea unguium: Secondary | ICD-10-CM | POA: Diagnosis not present

## 2017-04-02 DIAGNOSIS — B353 Tinea pedis: Secondary | ICD-10-CM | POA: Diagnosis not present

## 2017-04-02 DIAGNOSIS — Z79899 Other long term (current) drug therapy: Secondary | ICD-10-CM | POA: Diagnosis not present

## 2017-04-02 NOTE — Patient Instructions (Signed)

## 2017-04-04 NOTE — Progress Notes (Signed)
Subjective: Ricardo Jones presents the office today for follow-up evaluation of nail fungus, tinea pedis and starting Lamisil about 6 weeks ago. He states that he is having no side effects the medication really cannot yet tell much of a difference to his toenails. He denies any pain in the nails and denies any redness or drainage or any swelling. He has no new concerns today. Denies any systemic complaints such as fevers, chills, nausea, vomiting. No acute changes since last appointment, and no other complaints at this time.   He is proceeded on Lamisil but not on the last couple years.  Objective: AAO x3, NAD DP/PT pulses palpable bilaterally, CRT less than 3 seconds Nails appear to have some mild clearing on the proximal nail borders but overall appeared to be continuing to be dystrophic, discolored yellow-brown discoloration. There is no pain in the nails there is no swelling redness or drainage. There doesn't be a small area of tinea pedis to the left foot however this appears to be improving. There is no other skin lesions identified. No open lesions or pre-ulcerative lesions.  No pain with calf compression, swelling, warmth, erythema  Assessment: Onychomycosis, tinea pedis currently on Lamisil  Plan: -All treatment options discussed with the patient including all alternatives, risks, complications.  -At this point I recommended continued Lamisil for total of 3 months. We'll recheck a CBC and LFTs today. On admission is 3 months and towards the end of the 3 months if there is not significant improvement but it is getting better will consider fourth month. -Discussed external factors to help with fungus as well. -Patient encouraged to call the office with any questions, concerns, change in symptoms.   Celesta Gentile, DPM

## 2017-05-06 ENCOUNTER — Ambulatory Visit: Payer: BLUE CROSS/BLUE SHIELD | Admitting: Podiatry

## 2017-05-20 ENCOUNTER — Ambulatory Visit: Payer: BLUE CROSS/BLUE SHIELD | Admitting: Podiatry

## 2018-02-11 ENCOUNTER — Encounter: Payer: Self-pay | Admitting: Podiatry

## 2018-02-11 ENCOUNTER — Ambulatory Visit (INDEPENDENT_AMBULATORY_CARE_PROVIDER_SITE_OTHER): Payer: BLUE CROSS/BLUE SHIELD | Admitting: Podiatry

## 2018-02-11 DIAGNOSIS — B353 Tinea pedis: Secondary | ICD-10-CM

## 2018-02-11 DIAGNOSIS — B351 Tinea unguium: Secondary | ICD-10-CM

## 2018-02-11 DIAGNOSIS — I739 Peripheral vascular disease, unspecified: Secondary | ICD-10-CM | POA: Diagnosis not present

## 2018-02-11 DIAGNOSIS — Z79899 Other long term (current) drug therapy: Secondary | ICD-10-CM

## 2018-02-11 LAB — HEPATIC FUNCTION PANEL
AG Ratio: 2 (calc) (ref 1.0–2.5)
ALT: 33 U/L (ref 9–46)
AST: 24 U/L (ref 10–40)
Albumin: 4.3 g/dL (ref 3.6–5.1)
Alkaline phosphatase (APISO): 87 U/L (ref 40–115)
BILIRUBIN TOTAL: 1.5 mg/dL — AB (ref 0.2–1.2)
Bilirubin, Direct: 0.4 mg/dL — ABNORMAL HIGH (ref 0.0–0.2)
Globulin: 2.1 g/dL (calc) (ref 1.9–3.7)
Indirect Bilirubin: 1.1 mg/dL (calc) (ref 0.2–1.2)
Total Protein: 6.4 g/dL (ref 6.1–8.1)

## 2018-02-11 MED ORDER — TERBINAFINE HCL 250 MG PO TABS: 250.0000 mg | ORAL_TABLET | Freq: Every day | ORAL | 0 refills | Status: DC

## 2018-02-11 MED ORDER — NAFTIFINE HCL 2 % EX CREA: 1.0000 "application " | TOPICAL_CREAM | CUTANEOUS | 2 refills | Status: DC

## 2018-02-12 ENCOUNTER — Telehealth: Payer: Self-pay | Admitting: *Deleted

## 2018-02-12 DIAGNOSIS — B351 Tinea unguium: Secondary | ICD-10-CM | POA: Insufficient documentation

## 2018-02-12 DIAGNOSIS — B353 Tinea pedis: Secondary | ICD-10-CM | POA: Insufficient documentation

## 2018-02-12 NOTE — Telephone Encounter (Signed)
Mailed labs to pt

## 2018-02-12 NOTE — Telephone Encounter (Signed)
-----   Message from Trula Slade, DPM sent at 02/12/2018  7:03 AM EDT ----- Liver function is normal. Val let him know that he can start the pulse dose of lamisil. 1 week out of the month for 3 months for now. We will start Laser as soon as it is back. If he wants to hold the lamisil until we start laser that is fine as well.

## 2018-02-12 NOTE — Progress Notes (Signed)
Subjective: 47 year old male presents the office today with concerns of continued nail fungus.  He states that he did pretty good when he is on the Lamisil once he came off of the athlete's foot as well as the nail fungus came back.  He was discussed with her possible other options.  Denies any pain in the nails and denies any swelling or drainage or any other signs of infection.  He has no other concerns. Denies any systemic complaints such as fevers, chills, nausea, vomiting. No acute changes since last appointment, and no other complaints at this time.   Objective: AAO x3, NAD DP/PT pulses palpable bilaterally, CRT less than 3 seconds The nails continue be hypertrophic, dystrophic with ill-defined discoloration.  There is no pain in the nails there is no surrounding redness or drainage or any signs of infection.  Mild tinea pedis is present.   No open lesions or pre-ulcerative lesions.  No pain with calf compression, swelling, warmth, erythema  Assessment: Tinea pedis, onychomycosis  Plan: -All treatment options discussed with the patient including all alternatives, risks, complications.  -We discussed various treatment options.  After discussion he does want to proceed with laser.  This is out for maintenance once he comes back we will get him scheduled.  Discussed combination of topical or oral antifungal medicine.  We will continue Lamisil at the pulse dose at 1 week a month for 3 months.  We will check a liver function prior to this today.  Continue to monitor for side effects of Lamisil. -Described Naftin cream for the athlete's foot -He was concerned about possible circulation causing his issue.  He states he has a low heart rate and he is very tall.  He does have some discoloration to his toes at times.  I did an ABI in the office which was normal.  May have some component of small vessel disease however he has no claudication symptoms.  Will monitor. -Patient encouraged to call the  office with any questions, concerns, change in symptoms.   Trula Slade DPM

## 2018-02-12 NOTE — Telephone Encounter (Signed)
I informed pt of Dr. Leigh Aurora review of results and orders. Pt states he doesn't have a PCP that he knows of. I told pt I would mail a copy to him for his file.

## 2018-03-04 ENCOUNTER — Telehealth: Payer: Self-pay | Admitting: Podiatry

## 2018-03-04 NOTE — Telephone Encounter (Signed)
Patients wife called (Heidi) about  Naftifine HCl (NAFTIN) 2 % CREAM that was sent to the pharmacy it was too expensive would like a generic formula.

## 2018-03-04 NOTE — Telephone Encounter (Signed)
Can try clotrimazole cream 1% BID

## 2018-03-05 MED ORDER — CLOTRIMAZOLE 1 % EX CREA: 1.0000 "application " | TOPICAL_CREAM | Freq: Two times a day (BID) | CUTANEOUS | 2 refills | Status: DC

## 2018-03-05 NOTE — Telephone Encounter (Signed)
Left message informing pt an alternate medication had been called to the pharmacy.

## 2018-03-05 NOTE — Addendum Note (Signed)
Addended by: Harriett Sine D on: 03/05/2018 02:27 PM   Modules accepted: Orders

## 2018-04-01 ENCOUNTER — Ambulatory Visit: Payer: Self-pay | Admitting: Podiatry

## 2018-04-01 DIAGNOSIS — B351 Tinea unguium: Secondary | ICD-10-CM

## 2018-04-04 NOTE — Progress Notes (Signed)
Pt presents with mycotic infection of nails 1-5 bilateral.  All other systems are negative  Laser therapy administered to affected nails and tolerated well. All safety precautions were in place.  2nd treatment.  Follow up in 4 weeks     

## 2018-04-16 ENCOUNTER — Ambulatory Visit (INDEPENDENT_AMBULATORY_CARE_PROVIDER_SITE_OTHER): Payer: BLUE CROSS/BLUE SHIELD | Admitting: Cardiovascular Disease

## 2018-04-16 ENCOUNTER — Encounter

## 2018-04-16 ENCOUNTER — Encounter: Payer: Self-pay | Admitting: Cardiovascular Disease

## 2018-04-16 VITALS — BP 136/72 | HR 35 | Ht 79.0 in | Wt 245.0 lb

## 2018-04-16 DIAGNOSIS — I484 Atypical atrial flutter: Secondary | ICD-10-CM

## 2018-04-16 DIAGNOSIS — I5022 Chronic systolic (congestive) heart failure: Secondary | ICD-10-CM

## 2018-04-16 LAB — BASIC METABOLIC PANEL
BUN / CREAT RATIO: 20 (ref 9–20)
BUN: 23 mg/dL (ref 6–24)
CO2: 25 mmol/L (ref 20–29)
CREATININE: 1.16 mg/dL (ref 0.76–1.27)
Calcium: 10 mg/dL (ref 8.7–10.2)
Chloride: 102 mmol/L (ref 96–106)
GFR calc Af Amer: 86 mL/min/{1.73_m2} (ref >59)
GFR, EST NON AFRICAN AMERICAN: 74 mL/min/{1.73_m2} (ref >59)
GLUCOSE: 84 mg/dL (ref 65–99)
POTASSIUM: 4.5 mmol/L (ref 3.5–5.2)
SODIUM: 141 mmol/L (ref 134–144)

## 2018-04-16 LAB — TSH: TSH: 1.58 u[IU]/mL (ref 0.450–4.500)

## 2018-04-16 NOTE — Progress Notes (Signed)
Cardiology Office Note   Date:  04/16/2018   ID:  DEIDRICK RAINEY, DOB 1970-09-21, MRN [id number]  PCP:  Patient, No Pcp Per  Cardiologist: Ricardo Coco MD  ---> Ricardo Jones   Chief Complaint  Patient presents with  . Atrial Flutter      previouis notes from Dr. Domenic Schwab I [name] is a 47 y.o. male who presents for follow-up visit  This pleasant 47 year old gentleman is seen by me for a follow-up visit.  We saw him previously once on 03/30/2014. He is essentially self-referred. His mother [name] is our patient. [name] comes in for followup of his chronic atrial flutter. His flutter was first discovered in 2013 when he presented to the emergency room with a piece of chicken caught in his throat. He had to have anesthesia for removal of the foreign object and his preoperative EKG was abnormal and showed atrial flutter with slow ventricular response. This was the first time that he was told that he was in atrial flutter. The previous duration of the flutter is unknown since he was asymptomatic. Of interest is the fact that his mother had had atrial flutter. She went on to have a flutter ablation and with the procedure developed complete heart block and had to have a pacemaker. The patient is asymptomatic. He exercises regularly. He does not have any chest pain or shortness of breath. He has not had any dizziness or syncope. His resting heart rate is usually in the 30s. When he goes to the gym he can get his heart rate up to about 89.  He had an echocardiogram on 02/01/14 which showed an ejection fraction of 50-55%. There was no LVH. There was mild dilatation of the left ventricle and of the right ventricle. There was normal right ventricular function. There was moderate left atrial enlargement and severe right atrial enlargement. The atrial enlargement was felt to be secondary to prolonged atrial flutter. The patient takes no medication. In the past he has  declined any consideration of medication or of flutter ablation procedures. Since we saw him a year and a half ago he has felt well.  He has not been experiencing any chest pain or shortness of breath.  He has not had any lightheadedness or dizziness or syncope.  He exercises regularly.  He denies cardiac exercises as well as isometrics.  His weight is up 11 pounds since last visit which she attributes to increased muscle mass. At his last visit we had set him up for a mill stress test to look at his chronotropic competence but he never came for the stress test.  November 01, 2016: Ricardo Jones is seen for the firs time today - transfer from [place] with his wife, Ricardo Jones.  Able to do all of his normal activities without any CP or dyspnea. Has not been exercising as much ( 21 month old baby at home )  No syncope   Oct. 2, 2019:  Ricardo Jones is seen back today for follow-up of his slow atrial flutter with slow ventricular response.  He is completely asymptomatic. Has had some swelling in his left foot. 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  No CP, no dyspne Does not get much exercise - maybe 1-2 times a week  .  EF was 50-55% in 2015 EF had decreased in 2018 to 40-45%    Past Medical History:  Diagnosis Date  .  Bradycardia    asymptomatic  . S/P dilatation of esophageal stricture   . Typical atrial flutter ([place])     History reviewed. No pertinent surgical history.   Current Outpatient Medications  Medication Sig Dispense Refill  . clotrimazole (LOTRIMIN) 1 % cream Apply 1 application topically 2 (two) times daily. 30 g 2  . terbinafine (LAMISIL) 250 MG tablet Take 1 tablet (250 mg total) by mouth daily. Take for 1 week straight once a month for 3 months 21 tablet 0   No current facility-administered medications for this visit.     Allergies:   Patient has no known allergies.    Social History:  The  patient  reports that he has never smoked. He has never used smokeless tobacco. He reports that he does not drink alcohol or use drugs.   Family History:  The patient's family history includes Bradycardia in his unknown relative. his mother has atrial flutter.   ROS:  Please see the history of present illness.   Otherwise, review of systems are positive for none.   All other systems are reviewed and negative.    Physical Exam: Blood pressure 136/72, pulse (!) 35, height 6\' 7"  (2.007 m), weight 245 lb (111.1 kg), SpO2 98 %.  GEN:  Well nourished, well developed in no acute distress HEENT: Normal NECK: No JVD; No carotid bruits LYMPHATICS: No lymphadenopathy CARDIAC: RRR , no murmurs, rubs, gallops RESPIRATORY:  Clear to auscultation without rales, wheezing or rhonchi  ABDOMEN: Soft, non-tender, non-distended MUSCULOSKELETAL:  No edema; No deformity  SKIN: Warm and dry NEUROLOGIC:  Alert and oriented x 3   EKG: April 16, 2018: Atrial flutter with a very slow ventricular response.  Recent Labs: 02/11/2018: ALT 33    Lipid Panel    Component Value Date/Time   CHOL  08/20/2010 0002    179        ATP III CLASSIFICATION:  <200     mg/dL   Desirable  200-239  mg/dL   Borderline High  >=240    mg/dL   High          TRIG 31 08/20/2010 0002   HDL 45 08/20/2010 0002   CHOLHDL 4.0 08/20/2010 0002   VLDL 6 08/20/2010 0002   LDLCALC (H) 08/20/2010 0002    128        Total Cholesterol/HDL:CHD Risk Coronary Heart Disease Risk Table                     Men   Women  1/2 Average Risk   3.4   3.3  Average Risk       5.0   4.4  2 X Average Risk   9.6   7.1  3 X Average Risk  23.4   11.0        Use the calculated Patient Ratio above and the CHD Risk Table to determine the patient's CHD Risk.        ATP III CLASSIFICATION (LDL):  <100     mg/dL   Optimal  100-129  mg/dL   Near or Above                    Optimal  130-159  mg/dL   Borderline  160-189  mg/dL   High  >190      mg/dL   Very High      Wt Readings from Last 3 Encounters:  04/16/18 245 lb (111.1 kg)  11/01/16 253 lb  6.4 oz (114.9 kg)  08/01/15 263 lb (119.3 kg)      ASSESSMENT AND PLAN:  1. chronic atrial flutter with slow ventricular response.  Call presents today for further evaluation of his slow atrial flutter.  He is completely asymptomatic.  He denies any episodes of syncope or presyncope. His last echocardiogram showed an ejection fraction of 40 to 45%. Worried that he may have some sort of infiltrative process.  I would like to get a repeat echocardiogram.  I would have a low threshold to do an cardiac MRI.  2. family history of atrial flutter     This patients CHA2DS2-VASc Score is 0  Above score calculated as 1 point each if present [CHF, HTN, DM, Vascular=MI/PAD/Aortic Plaque, Age if 65-74, or Male] Above score calculated as 2 points each if present [Age > 75, or Stroke/TIA/TE]    Mertie Moores, MD  04/16/2018 1:53 PM    [facility] [address],  [place], [place]  [address] Pager [phone] Phone: [phone]; Fax: [phone]

## 2018-04-16 NOTE — Patient Instructions (Addendum)
Medication Instructions:  Your physician recommends that you continue on your current medications as directed. Please refer to the Current Medication list given to you today.   Labwork: TODAY - TSH, BMET   Testing/Procedures: Your physician has requested that you have an echocardiogram. Echocardiography is a painless test that uses sound waves to create images of your heart. It provides your doctor with information about the size and shape of your heart and how well your heart's chambers and valves are working. This procedure takes approximately one hour. There are no restrictions for this procedure.   Follow-Up: Your physician wants you to follow-up in: 6 months with Dr. Acie Fredrickson. You will receive a reminder letter in the mail two months in advance. If you don't receive a letter, please call our office to schedule the follow-up appointment.   If you need a refill on your cardiac medications before your next appointment, please call your pharmacy.   Thank you for choosing [facility]! Christen Bame, RN [phone]

## 2018-04-24 ENCOUNTER — Ambulatory Visit (HOSPITAL_COMMUNITY): Payer: BLUE CROSS/BLUE SHIELD | Attending: Internal Medicine

## 2018-04-24 ENCOUNTER — Other Ambulatory Visit: Payer: Self-pay

## 2018-04-24 DIAGNOSIS — I5022 Chronic systolic (congestive) heart failure: Secondary | ICD-10-CM | POA: Insufficient documentation

## 2018-04-24 DIAGNOSIS — I484 Atypical atrial flutter: Secondary | ICD-10-CM | POA: Insufficient documentation

## 2018-04-29 ENCOUNTER — Ambulatory Visit: Payer: Self-pay

## 2018-04-29 DIAGNOSIS — B351 Tinea unguium: Secondary | ICD-10-CM

## 2018-04-29 DIAGNOSIS — M79676 Pain in unspecified toe(s): Secondary | ICD-10-CM

## 2018-05-06 NOTE — Progress Notes (Signed)
Pt presents with mycotic infection of nails 1-5 bilateral.  All other systems are negative  Laser therapy administered to affected nails and tolerated well. All safety precautions were in place.  3rd treatment.  Follow up in 4 weeks     

## 2018-05-14 ENCOUNTER — Telehealth: Payer: Self-pay | Admitting: Cardiovascular Disease

## 2018-05-14 DIAGNOSIS — I484 Atypical atrial flutter: Secondary | ICD-10-CM

## 2018-05-14 DIAGNOSIS — I5022 Chronic systolic (congestive) heart failure: Secondary | ICD-10-CM

## 2018-05-14 NOTE — Telephone Encounter (Signed)
New message ° ° °Patient is returning call for echo results.  °

## 2018-05-14 NOTE — Telephone Encounter (Signed)
-----   Message from Thayer Headings, MD sent at 05/02/2018  4:04 PM EDT ----- EF is still low  - EF is 40-45% Lets get a cardiac MRI for further evaluation

## 2018-05-14 NOTE — Telephone Encounter (Signed)
Reviewed echo results and plan of care with patient. I answered questions to his satisfaction and he agrees to proceed with getting cardiac MRI. I advised him to call back with additional questions or concerns and he thanked me for the call.

## 2018-05-27 ENCOUNTER — Ambulatory Visit (INDEPENDENT_AMBULATORY_CARE_PROVIDER_SITE_OTHER): Payer: Self-pay

## 2018-05-27 DIAGNOSIS — B351 Tinea unguium: Secondary | ICD-10-CM

## 2018-05-27 NOTE — Progress Notes (Signed)
Pt presents with mycotic infection of nails 1-5 bilateral  All other systems are negative  Laser therapy administered to affected nails and tolerated well. All safety precautions were in place. 4thd treatment.  Follow up in 4 weeks

## 2018-05-28 ENCOUNTER — Telehealth: Payer: Self-pay | Admitting: Cardiovascular Disease

## 2018-05-28 NOTE — Telephone Encounter (Signed)
Received message from pt wife on DPR.Marland Kitchen Heidi and I called her number back twice and a man answered and said that I had the wrong number.. I repeated the number to him and double checked that I did not dial wrong... I called the pt and he asked me to call his wife on her number that I needed to speak to her and not him and gave me the exact same number again.. Will wait to see if she calls back.. Since she initiated the initial call.

## 2018-05-28 NOTE — Telephone Encounter (Signed)
New message   Patient's wife want's to know why this procedure was ordered? Please call to discuss.

## 2018-06-02 ENCOUNTER — Ambulatory Visit (HOSPITAL_COMMUNITY)
Admission: RE | Admit: 2018-06-02 | Discharge: 2018-06-02 | Disposition: A | Discharge status: Home / Self Care | Payer: BLUE CROSS/BLUE SHIELD | Source: Ambulatory Visit | Attending: Cardiovascular Disease | Admitting: Cardiovascular Disease

## 2018-06-02 DIAGNOSIS — I484 Atypical atrial flutter: Secondary | ICD-10-CM | POA: Diagnosis not present

## 2018-06-02 DIAGNOSIS — I5022 Chronic systolic (congestive) heart failure: Secondary | ICD-10-CM

## 2018-06-02 MED ORDER — GADOBUTROL 1 MMOL/ML IV SOLN
11.0000 mL | Freq: Once | INTRAVENOUS | Status: AC | PRN
  Administered 2018-06-02: 11 mL via INTRAVENOUS

## 2018-06-02 MED ORDER — GADOBENATE DIMEGLUMINE 529 MG/ML IV SOLN: 11.0000 mL | Freq: Once | INTRAVENOUS | Status: DC | PRN

## 2018-06-05 ENCOUNTER — Telehealth: Payer: Self-pay | Admitting: Nurse Practitioner

## 2018-06-05 DIAGNOSIS — Q211 Atrial septal defect, unspecified: Secondary | ICD-10-CM

## 2018-06-05 NOTE — Telephone Encounter (Signed)
-----   Message from Thayer Headings, MD sent at 06/05/2018  3:03 PM EST ----- Moderately dilated LV with EF of 44%. There is patchy midwall late Gadolinium enhancement - this is suspicious for Cardiac sarcoidosis Mild dilitation of RV.  Bi-atrial enlargement ? ASD   1.  Please arrange a cardiac CTA for evaluation of possible ASD vs. Aneurismal atrial septum We will also be able to view his coronaries  2.  Please also arrange for a cardiac PET scan at [facility] Dr. Meda Coffee has provided me with the tech who can help with this  Leanne Lovely  [phone]

## 2018-06-05 NOTE — Telephone Encounter (Signed)
MRI results reviewed with patient by Dr. Acie Fredrickson via telephone. Patient is aware that we would like to schedule a coronary CT here at [facility] and PET scan at [facility]. He is aware that we will call him back with scheduling details. MRI results also released to MyChart.

## 2018-06-06 NOTE — Telephone Encounter (Signed)
Spoke with patient to get information for the PET orders. Patient asks if Coronary CT and PET can be done at the same facility and since we do not do the PET scan here, could both be done at [facility]. I advised that I will call [place] to find out and call him back with that information. I left a message for Ulice Dash, the contact person for PET department at [facility].

## 2018-06-10 ENCOUNTER — Encounter: Payer: Self-pay | Admitting: Nurse Practitioner

## 2018-06-10 NOTE — Telephone Encounter (Signed)
Received call from patient's wife who states she has questions about patient's upcoming tests. I talked with Heidi and advised that paperwork for PET and Coronary CT were faxed to [facility] earlier today. I told her that the scheduler at [facility] said it would take 4 business days for them to process and then they will call the patient to schedule. I answered questions to her satisfaction. She states they would be interested in being referred to a specialist for cardiac sarcoidosis if possible if that is the confirmed diagnosis after PET. I advised that once the test results are complete and have been reviewed we will call patient and/or schedule appointment for both of them to come in to discuss options. She thanked me for my help.

## 2018-06-10 NOTE — Telephone Encounter (Signed)
Called patient to advise him that orders for PET and Coronary CT have been faxed to [facility]. I advised [facility] scheduling department said it will take 4 business days to process and they will call him for appointments. I advised him to call back with questions or concerns.

## 2018-06-18 ENCOUNTER — Telehealth: Payer: Self-pay | Admitting: Cardiovascular Disease

## 2018-06-18 NOTE — Telephone Encounter (Signed)
° °  Patient calling upset and disrespectful because of hold time calling into [facility]. Patient wants to speak directly with Dr Acie Fredrickson or nurse. Patient has questions about getting CT scan at [facility] instead of [facility] . Offered to get CT scheduled for patient he declined.  Please call

## 2018-06-18 NOTE — Telephone Encounter (Signed)
Returned call to patient who states his PET Scan is scheduled at [facility] on 12/17. He states he was notified by [facility] that the Coronary CT cannot be done there and that they tried to call back to let us know and were unable to wait the length of time given to speak to someone. Patient requests CT be done before 07/16/18 but is unavailable Dec. 17-19. I advised that I will forward message to scheduling and that someone from that department will call him to schedule. He thanked me for the call.

## 2018-06-23 NOTE — Telephone Encounter (Signed)
Patient's coronary CT is scheduled for 12/20.

## 2018-06-24 ENCOUNTER — Other Ambulatory Visit: Payer: Self-pay

## 2018-06-26 ENCOUNTER — Telehealth: Payer: Self-pay | Admitting: Cardiovascular Disease

## 2018-06-26 NOTE — Telephone Encounter (Signed)
New message    Pt wife is calling asking for call back. She has some questions before pt goes for a scan next week on Tuesday at [facility]. Pt wife states she sent a Mychart message and is calling because she hasn't heard anything. Please call.

## 2018-06-26 NOTE — Telephone Encounter (Signed)
Received call from [facility] with PET scan at [facility]. He reviewed their protocol for cardiac PET which does not include some of the requests from the patient. The patient sent an email requesting the following:  Please confirm that the PET scan ordered at [facility] and scheduled for Dec 17th will scan from orbits to thighs (whole body) and will involve two different scans: one to assess resting myocardial perfusion and areas of fibrosis or scar using 82 Rubidium and another scan to detect inflammation using FDG.  I am referring to protocol determined by the Cardiac Sarcoidosis [facility] protocol.   Called patient to report the differences with the PET scan that will be done at [facility] which include NH3 ammonia instead of rubidium and only thorax is scanned. He verbalized understanding and plans to proceed with this test at [facility]. I advised that our group will read the cardiac CT. Patient requests face to face visit to discuss results once tests are complete. I advised that we are happy to schedule a time to sit down with him but I cannot verify that he will not receive a call from [facility] with results. He verbalized understanding and agreement.

## 2018-07-01 DIAGNOSIS — D8685 Sarcoid myocarditis: Secondary | ICD-10-CM | POA: Diagnosis not present

## 2018-07-01 LAB — GLUCOSE, POCT (MANUAL RESULT ENTRY): POC Glucose: 77 mg/dl (ref 70–99)

## 2018-07-02 ENCOUNTER — Encounter (HOSPITAL_COMMUNITY): Payer: Self-pay | Admitting: Emergency Medicine

## 2018-07-02 ENCOUNTER — Telehealth: Payer: Self-pay | Admitting: Cardiovascular Disease

## 2018-07-02 ENCOUNTER — Telehealth (HOSPITAL_COMMUNITY): Payer: Self-pay | Admitting: Emergency Medicine

## 2018-07-02 NOTE — Telephone Encounter (Signed)
Reaching out to patient to offer assistance regarding upcoming cardiac imaging study;unable to reach patient but left name and call back number on voicemail for further questions should they arise Ricardo Bond RN Navigator Cardiac Imaging [phone]

## 2018-07-02 NOTE — Telephone Encounter (Signed)
       Ricardo Jones had a PET scan at [facility].  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   I discussed the case with Dr. Jolyn Nap .  I've also sent messages to Dr. Roselie Awkward in the pulmonary department who is willing to help out in the management of this patient.  Start methotrexate 15 mg  Once a week   - I suggest taking on Mondays  Folic Acid 1 mg a day - every day except on Mondays Prednisone 40 mg a day   He will need an appt to see Dr. Caryl Comes as he will need to have an ICD  Please work him into an office visit slot on Monday , Dec. 23 . I would prefer 8 AM . I am scheduled to be rounding in the hospital that week   Dr. Caryl Comes has some availability to see him on Dec. 26.   Melissa, will you schedule an appt for Ricardo Jones to see Dr. Caryl Comes on that day   Dr. Lake Bells has agreed to see the patient and follow long term.   Please write for a referral to pulmonary medicine .   Thanks    Mertie Moores, MD  07/02/2018 6:33 PM    [address],  [address] [place], [place]  [address] Pager [phone] Phone: [phone]; Fax: [phone]

## 2018-07-02 NOTE — Progress Notes (Unsigned)
     Ricardo Jones had a PET scan at [facility].  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   I discussed the case with Dr. Jolyn Nap .  I've also sent messages to Dr. Roselie Awkward in the pulmonary department who is willing to help out in the management of this patient.  Start methotrexate 15 mg  Once a week   - I suggest taking on Mondays  Folic Acid 1 mg a day - every day except on Mondays Prednisone 40 mg a day   He will need an appt to see Dr. Caryl Comes as he will need to have an ICD  Please work him into an office visit slot on Monday , Dec. 23 . I would prefer 8 AM . I am scheduled to be rounding in the hospital that week   Dr. Caryl Comes has some availability to see him on Dec. 26.   Melissa, will you schedule an appt for Ricardo Jones to see Dr. Caryl Comes on that day   Dr. Lake Bells has agreed to see the patient and follow long term.   Please write for a referral to pulmonary medicine .   Thanks    Mertie Moores, MD  07/02/2018 6:33 PM    [address],  [place], [place]  [address] Pager [phone] Phone: [phone]; Fax: [phone]

## 2018-07-03 NOTE — Telephone Encounter (Signed)
Pt is scheduled to see BQ tomorrow afternoon at 2:15.

## 2018-07-03 NOTE — Telephone Encounter (Signed)
Ricardo Jones, can we get him in to see me soon? We have openings this afternoon

## 2018-07-03 NOTE — Telephone Encounter (Signed)
  I have spoken with Ricardo Jones and have outlined the findings of the PET scan. Have outlined the initial medication plan  We had originally scheduled Ricardo Jones for a coronary CT angiogram which is scheduled for tomorrow.  In light of the diagnosis of sarcoidosis, I do not think that the coronary CT scan is absolutely necessary.  I see that Dr. Lake Bells is trying to get Ricardo Jones seen in the next day or so and I   agree with that plan.  It is possible that Dr. Lake Bells needs to do a different type of chest CT scan and a coronary CT angiogram may not be necessary.  I Suggest that we cancel the coronary CT angiogram at this time we can always reschedule it at a later time if needed. I suspect  that Ricardo Jones will need some additional studies from Dr. Lake Bells.      Ricardo Moores, MD  07/03/2018 10:04 AM    [address],  [place], [place]  [address] Pager [phone] Phone: [phone]; Fax: [phone]

## 2018-07-04 ENCOUNTER — Ambulatory Visit (INDEPENDENT_AMBULATORY_CARE_PROVIDER_SITE_OTHER): Payer: BLUE CROSS/BLUE SHIELD | Admitting: Pulmonary Disease

## 2018-07-04 ENCOUNTER — Ambulatory Visit (HOSPITAL_COMMUNITY): Payer: BLUE CROSS/BLUE SHIELD

## 2018-07-04 ENCOUNTER — Telehealth: Payer: Self-pay | Admitting: Cardiovascular Disease

## 2018-07-04 ENCOUNTER — Encounter: Payer: Self-pay | Admitting: Pulmonary Disease

## 2018-07-04 ENCOUNTER — Encounter: Payer: Self-pay | Admitting: Cardiovascular Disease

## 2018-07-04 VITALS — BP 122/66 | HR 54 | Ht 79.0 in | Wt 250.4 lb

## 2018-07-04 DIAGNOSIS — I4892 Unspecified atrial flutter: Secondary | ICD-10-CM | POA: Diagnosis not present

## 2018-07-04 DIAGNOSIS — D869 Sarcoidosis, unspecified: Secondary | ICD-10-CM | POA: Diagnosis not present

## 2018-07-04 NOTE — Progress Notes (Signed)
Synopsis: Referred in December 2019 for concern for possible sarcoidosis.  He was found to have bradycardia around 2012 and has been followed by the cardiology clinic since then.  He has a history of atrial fibrillation and he was noted to have decreasing left ventricular ejection fraction in 2019.  A cardiac MRI showed an abnormal area of gadolinium uptake concerning for possible sarcoidosis.  He later went on to have a pet scan done of his heart at [facility] which showed a small area of abnormal uptake consistent with inflammatory tissue.  Subjective:   PATIENT ID: Ricardo Jones GENDER: male DOB: 26-Aug-1970, MRN: [id number]   HPI  Chief Complaint  Patient presents with  . Consult    per Dr. Acie Fredrickson for sarcoidosis.      This is a pleasant 47 year old male who had asthma as a child who comes to my clinic today for possible sarcoidosis.  He says that he was always physically active and spent a lot of time swimming and biking as a child.  He later went on to become a triathlete in his mid 51s.  He was limited by asthma which was fairly severe during this time but by the time he was 28 he no longer had problems with it.  Around that time he had moved to [place] and stop using his inhaler in the dry air and he says his asthma symptoms improved.  Since then he says he will only periodically get a little chest tightness when he cuts the grass.  In 2012 he had food impaction and had to have an esophageal procedure.  During that time he was noted to have a very low heart rate.  He was referred to cardiology and was found to have a flutter.  He has been followed by cardiology ever since and this year he was found to have a decreased ejection fraction on an echocardiogram.  A cardiac MRI was performed after this which showed an area of abnormal gadolinium uptake suggestive of sarcoidosis.  A PET CT was arranged of his heart after this which also showed a small area of abnormal FDG activity in the  inferior basal segment of the heart.  Because of this he was referred to me for evaluation of possible sarcoidosis.  He tells me that in the last 2 to 3 years he has had declining energy levels but he has never felt short of breath.  He continues to go to the gym on a regular basis where he spends time on the bike and on the elliptical machine.  He does not do much weightlifting anymore.  He says that he has not noticed any significant change in his exercise intensity or duration over the last 2 years.  He says that he has not intentionally modified his routine for any reason.  He has not noticed any loss of weight.  He denies any cough, shortness of breath.  He was told to start methotrexate and prednisone based on the findings from the PET CT but he was concerned about side effects that he has not started them yet.  He says that he smokes cigarettes some in college but never consistently for many years.  He did have a history of some esophageal ulcerations and had an esophageal stricture.  Past Medical History:  Diagnosis Date  . Bradycardia    asymptomatic  . S/P dilatation of esophageal stricture   . Typical atrial flutter ([place])      Family History  Problem  Relation Age of Onset  . Bradycardia Other        multiple family members with pacemakers  . Asthma Mother   . Cancer Maternal Grandmother   . Cancer Maternal Grandfather   . Cancer Paternal Grandmother      Social History   Socioeconomic History  . Marital status: Single    Spouse name: Not on file  . Number of children: Not on file  . Years of education: Not on file  . Highest education level: Not on file  Occupational History  . Not on file  Social Needs  . Financial resource strain: Not on file  . Food insecurity:    Worry: Not on file    Inability: Not on file  . Transportation needs:    Medical: Not on file    Non-medical: Not on file  Tobacco Use  . Smoking status: Never Smoker  . Smokeless tobacco: Never  Used  Substance and Sexual Activity  . Alcohol use: No  . Drug use: No  . Sexual activity: Not on file  Lifestyle  . Physical activity:    Days per week: Not on file    Minutes per session: Not on file  . Stress: Not on file  Relationships  . Social connections:    Talks on phone: Not on file    Gets together: Not on file    Attends religious service: Not on file    Active member of club or organization: Not on file    Attends meetings of clubs or organizations: Not on file    Relationship status: Not on file  . Intimate partner violence:    Fear of current or ex partner: Not on file    Emotionally abused: Not on file    Physically abused: Not on file    Forced sexual activity: Not on file  Other Topics Concern  . 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 clotrimazole (LOTRIMIN) 1 % cream Apply 1 application topically 2 (two) times daily. 30 g 2  . terbinafine (LAMISIL) 250 MG tablet Take 1 tablet (250 mg total) by mouth daily. Take for 1 week straight once a month for 3 months 21 tablet 0   No facility-administered medications prior to visit.     Review of Systems  Constitutional: Negative for fever and weight loss.  HENT: Negative for congestion, ear pain, nosebleeds and sore throat.   Eyes: Negative for redness.  Respiratory: Negative for cough, shortness of breath and wheezing.   Cardiovascular: Negative for palpitations, leg swelling and PND.  Gastrointestinal: Negative for nausea and vomiting.  Genitourinary: Negative for dysuria.  Skin: Negative for rash.  Neurological: Negative for headaches.  Endo/Heme/Allergies: Does not bruise/bleed easily.  Psychiatric/Behavioral: Negative for depression. The patient is not nervous/anxious.       Objective:  Physical Exam   Vitals:   07/04/18  1425  BP: 122/66  Pulse: (!) 54  SpO2: 100%  Weight: 250 lb 6.4 oz (113.6 kg)  Height: 6\' 7"  (2.007 m)    Gen: well appearing, no acute distress HENT: NCAT, OP clear, neck supple without masses Eyes: PERRL, EOMi Lymph: no cervical lymphadenopathy PULM: CTA B CV: Bradycardic rate, no mgr, no JVD GI: BS+, soft, nontender, no hsm Derm: no rash or skin breakdown MSK:  normal bulk and tone Neuro: A&Ox4, CN II-XII intact, strength 5/5 in all 4 extremities Psyche: normal mood and affect   CBC    Component Value Date/Time   WBC 5.6 01/31/2017 0939   RBC 5.26 01/31/2017 0939   HGB 15.5 01/31/2017 0939   HCT 46.1 01/31/2017 0939   PLT 171 01/31/2017 0939   MCV 87.6 01/31/2017 0939   MCH 29.5 01/31/2017 0939   MCHC 33.6 01/31/2017 0939   RDW 14.0 01/31/2017 0939   LYMPHSABS 2,072 01/31/2017 0939   MONOABS 392 01/31/2017 0939   EOSABS 168 01/31/2017 0939   BASOSABS 56 01/31/2017 0939     Chest imaging:  Other imaging: 05/2018 Cardiac MRI: moderately dilated left ventricle with normal wall thickness and mildly decreased systolic function, patchy midwall late gad enhancement in LV, mildly dilated RV, severe Bi-atrial dilation, mild PA dilation; suspicious for cardiac sarcoidosis December 2019 cardiac PET from [place] a small area of abnormal FDG activity in the basilar inferior myocardium  PFT:  Labs:  Path:  Echo: 04/2018 TTE LVEF 40-45%, RV WNL  Heart Catheterization:       Assessment & Plan:   Chronic atrial flutter ([place])  Sarcoidosis - Plan: CT CHEST HIGH RESOLUTION  Discussion: It is not clear to me that [name] has sarcoidosis at this point, this may be because I need to be better educated on the sensitivity and specificity of cardiac PET scans.  Typically I make a diagnosis of sarcoidosis after I have been able to visualize abnormal tissue on histology which shows noncaseating granulomas in the absence of any sort of infection which might  explain this or other scenario which could explain it.  So while sarcoidosis may be highest on the differential diagnosis, I think it is important to have a CT scan of his chest and consider the differential diagnosis of abnormal FDG activity in the heart.  I need to talk to my cardiology colleagues more about this.  Plan: Possible sarcoidosis: I am going to arrange for a high-resolution CT scan of your chest If this shows abnormal lung tissue or abnormal lymph nodes in your chest then we will arrange for a procedure called an endobronchial ultrasound for a biopsy In the meantime, I would hold off on methotrexate or prednisone  Atrial fibrillation, bradycardia, congestive heart failure with abnormal cardiac MRI and cardiac PET: Keep follow-up with Drs. Nahser and Caryl Comes next week  I will call you with the results of the CT and then we will go from there  Tentatively we will arrange a follow-up visit with me for 2 to 3 weeks from now, though I may see you sooner depending on the results of the CT scan  > 50% of this 60 min visit spent face to face   No current outpatient medications on file.

## 2018-07-04 NOTE — Telephone Encounter (Signed)
     The patient was seen by Dr. Roselie Awkward today for pulmonary evaluation. Dr. Lake Bells is not entirely convinced that this is sarcoidosis.   He has ordered  a high-resolution CT scan of the chest to look for any pulmonary or mediastinal masses.  I discussed the case with Dr. Haroldine Laws. He knows the doctors at [facility] who have an expertise in cardiac sarcoidosis.  He will see if the doctors there can review Tetsuo's scan on Monday and determine just how satisfied they are that this is sarcoidosis.  We may need to refer him to [facility] to get a cardiac biopsy. It may be possible that he has some granulomas that are easier to get to for tissue diagnosis.  Ricardo Jones will be coming into the office on Monday for an office visit. I will inform him of the above-noted plans and decisions.    Mertie Moores, MD  07/04/2018 5:39 PM    [address],  [place], [place]  [address] Pager [phone] Phone: [phone]; Fax: [phone]

## 2018-07-04 NOTE — Progress Notes (Signed)
Myocardial Perfusion  Report    Ricardo Jones, Ricardo Jones Exam Date: 07/01/2018 10:39 Ordering Phys: Thayer Headings  MRN: [id number] Gender: M Exam Location: [place] Referring Phys: Thayer Headings  Age: 47 DOB: 1970-09-10 Ht (cm): 198 Wt (kg): 109 BMI: 27.83 Technologist: Nino Glow, CNMT  Procedure CPT: CTP 54098   History and Risk Factors:  Patient History: Cardiac Sarcoidosis  Hours NPO: 18 Hours without caffeine:   Comparison to prior studies:  No Prior Studies For Comparison.    Image Analysis:    Rest Reinject. Conclusion  Basal Anterior Normal Normal Normal  Basal Ant Septal Normal Normal Normal  Basal Inf Septal Normal Moderate See Below  Basal Inferior Mild Moderate See Below  Basal Inf Lat Normal Normal Normal  Basal Ant Lat Normal Normal Normal  Mid Anterior Normal Normal Normal  Mid Ant Septal Normal Normal Normal  Mid Inf Septal Normal Mild See Below  Mid Inferior Normal Normal Normal  Mid Inf Lateral Normal Normal Normal  Mid Ant Lateral Normal Normal Normal  Apical Anterior Normal Normal Normal  Apical Septal Normal Normal Normal  Apical Inferior Normal Normal Normal  Apical Lateral Normal Normal Normal  Apex Normal Normal Normal  SPECT RESULTS  Technical Quality: Excellent  Raw Data Analysis: Adequate   PERFUSION: Rest PET images are abnormal.    IMAGE PROTOCOL Modality: PET - GE 690  Radiopharmaceutical Dose (mCi) Imaging Date Inj Time Img Time Rescan Reason Rescan Time  Rest: N-13 Ammonia, IV 19.68 07/01/2018 12.06 12.06  Rest Administration Site: Left Forearm Tech Administering Rest Dose: Burt Knack, CNMT  Stress Administration Site: Tech Administering Stress Dose:      Wall Motion  Wall Thickening  Anterior:  Apex:  Inferior:  Lateral:  Septal:       FINAL COMMENTS  There is a snmall are in the basal segment of the inferior with mildly decreased perfusion. On the metabolic study there abnormal  FDG upteke consistent with  inflamatory process of the myocardium. More especificaly involving the basal-inferior, Basal infero  septal and mid infero-septal walls. In the porper clinical settings this may represent active sarcoidosis in the myocardium.   15.86mCi 55F FDG / Lt Forearm PIV / 12.34pm on 07/01/2018 by JAI / Scan time at 57mins 14:05.     Electronically Reviewed by: Theophilus Kinds M.D.  (Electronically Signed)  Final Date: 02 July 2018 07:38  Other Result Information  Interface, Rad Results In - 07/02/2018  7:43 AM EST     Myocardial Perfusion  Report    Ricardo Jones, Ricardo Jones Exam Date: 07/01/2018 10:39 Ordering Phys: Thayer Headings  MRN: [id number] Gender: M Exam Location: [place] Referring Phys: Thayer Headings  Age: 47 DOB: 11/28/1970 Ht (cm): 198 Wt (kg): 109 BMI: 27.83 Technologist: Nino Glow, CNMT  Procedure CPT: CTP  95621   History and Risk Factors:  Patient History: Cardiac Sarcoidosis  Hours NPO: 18 Hours without caffeine:   Comparison to prior studies:  No Prior Studies For Comparison.    Image Analysis:    Rest Reinject. Conclusion  Basal Anterior Normal Normal Normal  Basal Ant Septal Normal Normal Normal  Basal Inf Septal Normal Moderate See Below  Basal Inferior Mild Moderate See Below  Basal Inf Lat Normal Normal Normal  Basal Ant Lat Normal Normal Normal  Mid Anterior Normal Normal Normal  Mid Ant Septal Normal Normal Normal  Mid Inf Septal Normal Mild See Below  Mid Inferior Normal Normal Normal  Mid Inf Lateral  Normal Normal Normal  Mid Ant Lateral Normal Normal Normal  Apical Anterior Normal Normal Normal  Apical Septal Normal Normal Normal  Apical Inferior Normal Normal Normal  Apical Lateral Normal Normal Normal  Apex Normal Normal Normal  SPECT RESULTS  Technical Quality: Excellent  Raw Data Analysis: Adequate   PERFUSION: Rest PET images are abnormal.    IMAGE PROTOCOL Modality: PET - GE 690  Radiopharmaceutical Dose (mCi) Imaging   Date Inj Time Img Time Rescan Reason Rescan Time  Rest: N-13 Ammonia, IV 19.68 07/01/2018 12.06 12.06  Rest Administration Site: Left Forearm Tech Administering Rest Dose: Burt Knack, CNMT  Stress Administration Site: Tech Administering Stress Dose:         Wall Motion   Wall Thickening  Anterior:  Apex:  Inferior:  Lateral:  Septal:       FINAL COMMENTS  There is a snmall are in the basal segment of the inferior with mildly decreased perfusion. On the metabolic study there abnormal  FDG upteke consistent with inflamatory process of the myocardium. More especificaly involving the basal-inferior, Basal infero  septal and mid infero-septal walls. In the porper clinical settings this may represent active sarcoidosis in the myocardium.   15.43mCi 49 FDG  / Lt Forearm PIV  / 12.34pm  on 07/01/2018 by JAI  /  Scan time at 67mins 14:05.     Electronically Reviewed by: Theophilus Kinds M.D.  (Electronically Signed)  Final Date: 02 July 2018 07:38

## 2018-07-04 NOTE — Telephone Encounter (Signed)
New message    Per patient's wife need information about PET scan and she has other questions also. Please call.

## 2018-07-04 NOTE — Patient Instructions (Signed)
Possible sarcoidosis: I am going to arrange for a high-resolution CT scan of your chest If this shows abnormal lung tissue or abnormal lymph nodes in your chest then we will arrange for a procedure called an endobronchial ultrasound for a biopsy In the meantime, I would hold off on methotrexate or prednisone  Atrial fibrillation, bradycardia, congestive heart failure with abnormal cardiac MRI and cardiac PET: Keep follow-up with Drs. Nahser and Caryl Comes next week  I will call you with the results of the CT and then we will go from there  Tentatively we will arrange a follow-up visit with me for 2 to 3 weeks from now, though I may see you sooner depending on the results of the CT scan

## 2018-07-04 NOTE — Telephone Encounter (Signed)
Spoke with patient's wife who has questions about whether patient needs additional testing to r/o ASD which was mentioned in his MRI report. I advised that I will forward that question to Dr. Acie Fredrickson. She requests that patient have an appointment with Dr. Acie Fredrickson next week which we have planned for 8 am on Monday 12/23. I advised that a consult with Dr. Caryl Comes is needed to discuss the need for an ICD. That appointment is scheduled for 12/26. She states patient has an appointment with Dr. Lake Bells, pulmonology today. I answered questions to her satisfaction and advised we will see the patient Monday morning to address further questions. She verbalized understanding and agreement and thanked me for the call.

## 2018-07-07 ENCOUNTER — Ambulatory Visit (INDEPENDENT_AMBULATORY_CARE_PROVIDER_SITE_OTHER): Payer: BLUE CROSS/BLUE SHIELD | Admitting: Cardiovascular Disease

## 2018-07-07 ENCOUNTER — Ambulatory Visit (INDEPENDENT_AMBULATORY_CARE_PROVIDER_SITE_OTHER)
Admission: RE | Admit: 2018-07-07 | Discharge: 2018-07-07 | Disposition: A | Discharge status: Home / Self Care | Payer: BLUE CROSS/BLUE SHIELD | Source: Ambulatory Visit | Attending: Pulmonary Disease | Admitting: Pulmonary Disease

## 2018-07-07 ENCOUNTER — Encounter: Payer: Self-pay | Admitting: Cardiovascular Disease

## 2018-07-07 DIAGNOSIS — I5022 Chronic systolic (congestive) heart failure: Secondary | ICD-10-CM | POA: Insufficient documentation

## 2018-07-07 DIAGNOSIS — D869 Sarcoidosis, unspecified: Secondary | ICD-10-CM

## 2018-07-07 DIAGNOSIS — D8685 Sarcoid myocarditis: Secondary | ICD-10-CM

## 2018-07-07 NOTE — Patient Instructions (Signed)
Medication Instructions:  Your physician does not recommend that you start any medications  Lab work: None Ordered   Testing/Procedures: None Ordered   Follow-Up: You have been referred to Dr. Haroldine Laws, [facility] Their office will call you with an appointment   At [facility], you and your health needs are our priority.  As part of our continuing mission to provide you with exceptional heart care, we have created designated Provider Care Teams.  These Care Teams include your primary Cardiologist (physician) and Advanced Practice Providers (APPs -  Physician Assistants and Nurse Practitioners) who all work together to provide you with the care you need, when you need it. You will need a follow up appointment in:  3 months.  Please call our office 2 months in advance to schedule this appointment.  You may see Mertie Moores, MD or one of the following Advanced Practice Providers on your designated Care Team: Richardson Dopp, PA-C [place], [place] . Daune Perch, NP

## 2018-07-07 NOTE — Progress Notes (Signed)
Cardiology Office Note   Date:  07/07/2018   ID:  Ricardo Jones, DOB 1971-05-23, MRN [id number]  PCP:  Patient, No Pcp Per  Cardiologist: Ricardo Coco MD  ---> Ricardo Jones   No chief complaint on file.     previouis notes from Dr. Domenic Schwab I Ricardo Jones is a 47 y.o. male who presents for follow-up visit  This pleasant 47 year old gentleman is seen by me for a follow-up visit.  We saw him previously once on 03/30/2014. He is essentially self-referred. [name] is our patient. Mr. Ricardo Jones comes in for followup of his chronic atrial flutter. His flutter was first discovered in 2013 when he presented to the emergency room with a piece of chicken caught in his throat. He had to have anesthesia for removal of the foreign object and his preoperative EKG was abnormal and showed atrial flutter with slow ventricular response. This was the first time that he was told that he was in atrial flutter. The previous duration of the flutter is unknown since he was asymptomatic. Of interest is the fact that his mother had had atrial flutter. She went on to have a flutter ablation and with the procedure developed complete heart block and had to have a pacemaker. The patient is asymptomatic. He exercises regularly. He does not have any chest pain or shortness of breath. He has not had any dizziness or syncope. His resting heart rate is usually in the 30s. When he goes to the gym he can get his heart rate up to about 89.  He had an echocardiogram on 02/01/14 which showed an ejection fraction of 50-55%. There was no LVH. There was mild dilatation of the left ventricle and of the right ventricle. There was normal right ventricular function. There was moderate left atrial enlargement and severe right atrial enlargement. The atrial enlargement was felt to be secondary to prolonged atrial flutter. The patient takes no medication. In the past he has declined any consideration of  medication or of flutter ablation procedures. Since we saw him a year and a half ago he has felt well.  He has not been experiencing any chest pain or shortness of breath.  He has not had any lightheadedness or dizziness or syncope.  He exercises regularly.  He denies cardiac exercises as well as isometrics.  His weight is up 11 pounds since last visit which she attributes to increased muscle mass. At his last visit we had set him up for a mill stress test to look at his chronotropic competence but he never came for the stress test.  November 01, 2016: Ricardo Jones is seen for the firs time today - transfer from [place] with his wife, Ricardo Jones.  Able to do all of his normal activities without any CP or dyspnea. Has not been exercising as much ( 31 month old baby at home )  No syncope   Oct. 2, 2019:  Ricardo Jones is seen back today for follow-up of his slow atrial flutter with slow ventricular response.  He is completely asymptomatic. Has had some swelling in his left foot. 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  No CP, no dyspne Does not get much exercise - maybe 1-2 times a week  .  EF was 50-55% in 2015 EF had decreased in 2018 to 40-45%   Dec. 23, 2019:  Seen today to discuss possibile dx of Sarcoidosis Has seen dr.  McQuaid.   We performed a cardiac MRI which was suspicious for sarcoidosis.  We then referred him to Ricardo Jones for a PET scan.  The PET scan also was very suspicious for cardiac sarcoidosis.  He is having a high resolution CT scan today to look for other areas of sarcoidosis.    hes not ready to start steroids or methotrexate year yet.  We still do not have a tissue diagnosis. He remains completely asymptomatic.  Denies any chest pain, syncope, weakness or dizziness.     Past Medical History:  Diagnosis Date  . Bradycardia    asymptomatic  . S/P dilatation of esophageal  stricture   . Typical atrial flutter [facility])     Past Surgical History:  Procedure Laterality Date  . THROAT SURGERY  2012     No current outpatient medications on file.   No current facility-administered medications for this visit.     Allergies:   Patient has no known allergies.    Social History:  The patient  reports that he has never smoked. He has never used smokeless tobacco. He reports that he does not drink alcohol or use drugs.   Family History:  The patient's family history includes Asthma in his mother; Bradycardia in an other family member; Cancer in his maternal grandfather, maternal grandmother, and paternal grandmother. his mother has atrial flutter.   ROS:  Please see the history of present illness.   Otherwise, review of systems are positive for none.   All other systems are reviewed and negative.   Physical Exam: Blood pressure 100/60, pulse (!) 36, height 6\' 7"  (2.007 m), weight 240 lb (108.9 kg), SpO2 99 %.  GEN:  Well nourished, well developed in no acute distress HEENT: Normal NECK: No JVD; No carotid bruits LYMPHATICS: No lymphadenopathy CARDIAC: RR.   Hr is slow  RESPIRATORY:  Clear to auscultation without rales, wheezing or rhonchi  ABDOMEN: Soft, non-tender, non-distended MUSCULOSKELETAL:  No edema; No deformity  SKIN: Warm and dry NEUROLOGIC:  Alert and oriented x 3    EKG:   Recent Labs: 02/11/2018: ALT 33 04/16/2018: BUN 23; Creatinine, Ser 1.16; Potassium 4.5; Sodium 141; TSH 1.580    Lipid Panel    Component Value Date/Time   CHOL  08/20/2010 0002    179        ATP III CLASSIFICATION:  <200     mg/dL   Desirable  200-239  mg/dL   Borderline High  >=240    mg/dL   High          TRIG 31 08/20/2010 0002   HDL 45 08/20/2010 0002   CHOLHDL 4.0 08/20/2010 0002   VLDL 6 08/20/2010 0002   LDLCALC (H) 08/20/2010 0002    128        Total Cholesterol/HDL:CHD Risk Coronary Heart Disease Risk Table                     Men   Women  1/2  Average Risk   3.4   3.3  Average Risk       5.0   4.4  2 X Average Risk   9.6   7.1  3 X Average Risk  23.4   11.0        Use the calculated Patient Ratio above and the CHD Risk Table to determine the patient's CHD Risk.        ATP III CLASSIFICATION (LDL):  <100     mg/dL  Optimal  100-129  mg/dL   Near or Above                    Optimal  130-159  mg/dL   Borderline  160-189  mg/dL   High  >190     mg/dL   Very High      Wt Readings from Last 3 Encounters:  07/07/18 240 lb (108.9 kg)  07/04/18 250 lb 6.4 oz (113.6 kg)  04/16/18 245 lb (111.1 kg)      ASSESSMENT AND PLAN:  1.  Chronic systolic congestive heart failure: The patient's ejection fraction is 40 to 45%. Pressure and heart rate are very low.  I am not sure if you will tolerate any standard CHF medications.  I would like for him to see Dr. Haroldine Laws for further advice.  2.  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  We need to get a tissue diagnosis.  He will be having a CT scan today.  He wants to be evaluated by [facility]. We have provided him records   2. family history of atrial flutter       Mertie Moores, MD  07/07/2018 8:19 AM    [address],  [place], [place]  [address] Pager [phone] Phone: [phone]; Fax: [phone]

## 2018-07-10 ENCOUNTER — Encounter: Payer: Self-pay | Admitting: Internal Medicine

## 2018-07-10 ENCOUNTER — Ambulatory Visit (INDEPENDENT_AMBULATORY_CARE_PROVIDER_SITE_OTHER): Payer: BLUE CROSS/BLUE SHIELD | Admitting: Internal Medicine

## 2018-07-10 VITALS — BP 114/70 | HR 36 | Ht 79.0 in | Wt 244.0 lb

## 2018-07-10 DIAGNOSIS — D8685 Sarcoid myocarditis: Secondary | ICD-10-CM | POA: Diagnosis not present

## 2018-07-10 NOTE — Progress Notes (Signed)
ELECTROPHYSIOLOGY CONSULT NOTE  Patient ID: Ricardo Jones, MRN: [id number], DOB/AGE: 1970/12/27 47 y.o. Admit date: (Not on file) Date of Consult: 07/17/2018  Primary Physician: Patient, No Pcp Per Primary Cardiologist: PNa     Ricardo Jones is a 47 y.o. male who is being seen today for the evaluation of possible cardiac sarcoid at the request of probable cardiac sarcoid.    HPI Ricardo Jones is a 47 y.o. 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  This was based on decreasing LV function, complete heart block abnormal MRI and abnormal PET scan.  He saw Dr. Glendon Axe last week who ordered a high resolution CT scan.  This report was reviewed and is unrevealing of sarcoid. Denies symptoms assoc with longstanding atrial fibrillation and heart block  Specifically, denies chest pain, shortness of breath, nocturnal dyspnea, orthopnea or peripheral edema.  There have been no palpitations, lightheadedness or syncope.     DATE TEST EF   7/15 Echo   50-55 % Severe RAE/LAE  5/18 Echo  40-45 % Mild RAE/LAE  10/19 Echo  40.-45%   11/19 cMRI  Severe BAE DGE+>Sarcoid  12/18 cPET  + Consistent Sarcoid    Thromboembolic risk factors (CHF-1 *) for a CHADSVASc Score of sort of 1     Past Medical History:  Diagnosis Date  . Bradycardia    asymptomatic  . S/P dilatation of esophageal stricture   . Typical atrial flutter [facility])       Surgical History:  Past Surgical History:  Procedure Laterality Date  . THROAT SURGERY  2012     Home Meds: No outpatient medications have been marked as taking for the 07/10/18 encounter (Office Visit) with Deboraha Sprang, MD.    Allergies: No Known Allergies  Social History   Socioeconomic History  . Marital status: Single    Spouse name: Not on file  . Number of children: Not on file  . Years of education: Not on file  . Highest education level: Not on file    Occupational History  . Not on file  Social Needs  . Financial resource strain: Not on file  . Food insecurity:    Worry: Not on file    Inability: Not on file  . Transportation needs:    Medical: Not on file    Non-medical: Not on file  Tobacco Use  . Smoking status: Never Smoker  . Smokeless tobacco: Never Used  Substance and Sexual Activity  . Alcohol use: No  . Drug use: No  . Sexual activity: Not on file  Lifestyle  . Physical activity:    Days per week: Not on file    Minutes per session: Not on file  . Stress: Not on file  Relationships  . Social connections:    Talks on phone: Not on file    Gets together: Not on file    Attends religious service: Not on file    Active member of club or organization: Not on file    Attends meetings of clubs or organizations: Not on file    Relationship status: Not on file  . Intimate partner violence:    Fear of current or ex partner: Not on file    Emotionally abused: Not on file    Physically abused: Not on file    Forced sexual activity: Not on file  Other Topics Concern  . 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 Bradycardia Other        multiple family members with pacemakers  . Asthma Mother   . Cancer Maternal Grandmother   . Cancer Maternal Grandfather   . Cancer Paternal Grandmother      ROS:  Please see the history of present illness.All other systems reviewed and negative.    Physical Exam:  Blood pressure 114/70, pulse (!) 36, height 6\' 7"  (2.007 m), weight 244 lb (110.7 kg), SpO2 98 %. General: Well developed, well nourished male in no acute distress. Head: Normocephalic, atraumatic, sclera non-icteric, no xanthomas, nares are without discharge. EENT: normal  Lymph Nodes:  none Neck: Negative for carotid bruits. JVD 8-10 cm Back:without scoliosis kyphosis Lungs: Clear  bilaterally to auscultation without wheezes, rales, or rhonchi. Breathing is unlabored. Heart: slow but RRR with S1 S2  2/6 systolic murmur . No rubs, or gallops appreciated. Abdomen: Soft, non-tender, non-distended with normoactive bowel sounds. No hepatomegaly. No rebound/guarding. No obvious abdominal masses. Msk:  Strength and tone appear normal for age. Extremities: No clubbing or cyanosis. No  edema.  Distal pedal pulses are 2+ and equal bilaterally. Skin: Warm and Dry Neuro: Alert and oriented X 3. CN III-XII intact Grossly normal sensory and motor function . Psych:  Responds to questions appropriately with a normal affect.      Labs: Cardiac Enzymes No results for input(s): CKTOTAL, CKMB, TROPONINI in the last 72 hours. CBC Lab Results  Component Value Date   WBC 5.6 01/31/2017   HGB 15.5 01/31/2017   HCT 46.1 01/31/2017   MCV 87.6 01/31/2017   PLT 171 01/31/2017   PROTIME: No results for input(s): LABPROT, INR in the last 72 hours. Chemistry No results for input(s): NA, K, CL, CO2, BUN, CREATININE, CALCIUM, PROT, BILITOT, ALKPHOS, ALT, AST, GLUCOSE in the last 168 hours.  Invalid input(s): LABALBU Lipids Lab Results  Component Value Date   CHOL  08/20/2010    179        ATP III CLASSIFICATION:  <200     mg/dL   Desirable  200-239  mg/dL   Borderline High  >=240    mg/dL   High          HDL 45 08/20/2010   LDLCALC (H) 08/20/2010    128        Total Cholesterol/HDL:CHD Risk Coronary Heart Disease Risk Table                     Men   Women  1/2 Average Risk   3.4   3.3  Average Risk       5.0   4.4  2 X Average Risk   9.6   7.1  3 X Average Risk  23.4   11.0        Use the calculated Patient Ratio above and the CHD Risk Table to determine the patient's CHD Risk.        ATP III CLASSIFICATION (LDL):  <100     mg/dL   Optimal  100-129  mg/dL   Near or Above                    Optimal  130-159  mg/dL   Borderline  160-189  mg/dL   High  >190     mg/dL  Very High   TRIG 31 08/20/2010   BNP Pro B Natriuretic peptide (BNP)  Date/Time Value Ref Range Status  08/19/2010 04:53 PM 50.5 0.0 - 100.0 pg/mL Final   Thyroid Function Tests: No results for input(s): TSH, T4TOTAL, T3FREE, THYROIDAB in the last 72 hours.  Invalid input(s): FREET3 Miscellaneous No results found for: DDIMER  Radiology/Studies:  Ct Chest High Resolution  Result Date: 07/07/2018 CLINICAL DATA:  Sarcoid. EXAM: CT CHEST WITHOUT CONTRAST TECHNIQUE: Multidetector CT imaging of the chest was performed following the standard protocol without intravenous contrast. High resolution imaging of the lungs, as well as inspiratory and expiratory imaging, was performed. COMPARISON:  None. FINDINGS: Cardiovascular: Pulmonic trunk and heart are enlarged. No pericardial effusion. Mediastinum/Nodes: Triangular-shaped stippled soft tissue in the prevascular space is likely thymus. Mediastinal lymph nodes are not enlarged by CT size criteria. Hilar regions are difficult to evaluate without IV contrast. No axillary adenopathy. Esophagus is grossly unremarkable. Lungs/Pleura: A subpleural lymph node is seen along the right major fissure. 5 mm subpleural right lower lobe nodule is also likely a benign subpleural lymph node. No perilymphatic nodularity, bronchiectasis, confluent soft tissue or architectural distortion. No subpleural reticulation, traction bronchiectasis/bronchiolectasis, ground-glass or honeycombing. No air trapping. Lungs are otherwise clear. No pleural fluid. Airway is unremarkable. Upper Abdomen: Visualized portions of the liver, right adrenal gland, spleen, pancreas, stomach and bowel are grossly unremarkable. Upper abdominal lymph nodes are not enlarged by CT size criteria. Musculoskeletal: Degenerative changes in the spine. No worrisome lytic or sclerotic lesions. IMPRESSION: 1. No findings to indicate sarcoid. 2. Enlarged pulmonic trunk, indicative of pulmonary arterial  hypertension. Electronically Signed   By: Lorin Picket M.D.   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  The family has reached out to [place] [facility] where our colleague Dr Margaretann Loveless spent a good deal of time.  I have reviewed the case with her, and while she is also inclined towards a diagnosis of cardiac sarcoid, she too thinks, given the lack of tissue diagnosis, which is part of the HRS criteria, that input from [facility] would be useful.  I have also reached out to Dr Sampson Si @ [facility] who has special sarcoid and myocarditis expertise  If a presumptive diagnosis of cardiac sarcoid is made, would lean towards CRT-ICD implantation for protection against life threatening ventricular arrhythmias and also progressive conduction system disease   For now will hold off on therapy    Furthermore, think appropriate to consider ablation of the flutter substrate at some point and he would need preprocedural anticoagulation      Virl Axe

## 2018-07-10 NOTE — Patient Instructions (Addendum)
Medication Instructions:  Your physician recommends that you continue on your current medications as directed. Please refer to the Current Medication list given to you today.  Labwork: None ordered.  Testing/Procedures: None ordered.  Follow-Up: Your physician wants you to follow-up in: as needed with Dr. Klein.       Any Other Special Instructions Will Be Listed Below (If Applicable).  If you need a refill on your cardiac medications before your next appointment, please call your pharmacy.   

## 2018-07-11 ENCOUNTER — Telehealth: Payer: Self-pay | Admitting: Internal Medicine

## 2018-07-11 NOTE — Telephone Encounter (Signed)
Wife called back. Informed that their MyChart message and this call will be forwarded to Dr. Caryl Comes and his nurse to address. They understand that any testing/procedures that may need to be done will not be completed before the end of the year. She understands that office will contact her once Dr. Caryl Comes has spoken w/ needed physician/s.   She would like a return call as soon as we know something.

## 2018-07-11 NOTE — Telephone Encounter (Signed)
New Message   Pt's wife is calling states Dr. Caryl Comes wants the  pt to have an ocular scan, they would like to know if it can be scheduled  before the new year.  Please call

## 2018-07-14 NOTE — Telephone Encounter (Signed)
LVM with pt's wife regarding Dr Caryl Comes being out of the office until next week. I will follow up next week upon his return.

## 2018-07-17 ENCOUNTER — Encounter: Payer: Self-pay | Admitting: Internal Medicine

## 2018-07-17 NOTE — Progress Notes (Signed)
Spoke w Dr Sampson Si [facility]  First recommendation is to get Tn and Pro-BNP for baseline and to follow  Two approaches have been pursued 1) Rx for presumed sarcoid and follow PET SUV number which we dont have but can be obtained from [place] and undertake CRT-D 2) Refer for voltage/egram guided Bx >> if Positive Rx as above If neg there is some discrepancies, but with heart block>> proceed with device and immspp therapy and follow PET 3) No data as to how long to treat  There is an ongoing trial at [place] randomizing prob CS to mtx and prednisone v prednisone alone  L[place] can u plz arrange for the blood work above  I will call the family next week, but Dr York Cerise recommendation is in favor of the egram guided biopsy and he has given me contact information for finding the right MD @ [facility] [place] to whom he could be referred  thnaks

## 2018-07-18 ENCOUNTER — Telehealth: Payer: Self-pay

## 2018-07-18 DIAGNOSIS — I5043 Acute on chronic combined systolic (congestive) and diastolic (congestive) heart failure: Secondary | ICD-10-CM

## 2018-07-18 NOTE — Telephone Encounter (Signed)
Per Dr Caryl Comes, he would like pt to come in for a baseline Troponin and Pro BNP. Pt agrees with this. Order placed and appt date made.

## 2018-07-18 NOTE — Telephone Encounter (Signed)
-----   Message from Deboraha Sprang, MD sent at 07/17/2018  4:01 PM EST ----- thks

## 2018-07-21 ENCOUNTER — Telehealth: Payer: Self-pay | Admitting: Physician Assistant

## 2018-07-21 ENCOUNTER — Other Ambulatory Visit: Payer: BLUE CROSS/BLUE SHIELD | Admitting: *Deleted

## 2018-07-21 DIAGNOSIS — I5022 Chronic systolic (congestive) heart failure: Secondary | ICD-10-CM | POA: Diagnosis not present

## 2018-07-21 DIAGNOSIS — I442 Atrioventricular block, complete: Secondary | ICD-10-CM | POA: Diagnosis not present

## 2018-07-21 DIAGNOSIS — I5043 Acute on chronic combined systolic (congestive) and diastolic (congestive) heart failure: Secondary | ICD-10-CM

## 2018-07-21 DIAGNOSIS — D8685 Sarcoid myocarditis: Secondary | ICD-10-CM | POA: Diagnosis not present

## 2018-07-21 DIAGNOSIS — I484 Atypical atrial flutter: Secondary | ICD-10-CM | POA: Diagnosis not present

## 2018-07-21 NOTE — Telephone Encounter (Signed)
Paged by answering service for critical labs: Troponin 0.07 & BNP 1654. Patient undergoing evaluation for cardiac sarcoid. Reviewed with DOD Dr. Margaretann Loveless. This lab done as baseline routine. I have called the patient and emergency contact person to update abnormal labs and to assess symptoms but both times it went to voice mail. Will defer further direction per Dr. Caryl Comes tomorrow.

## 2018-07-22 ENCOUNTER — Ambulatory Visit: Payer: BLUE CROSS/BLUE SHIELD | Admitting: Pulmonary Disease

## 2018-07-22 LAB — PRO B NATRIURETIC PEPTIDE: NT-PRO BNP: 1654 pg/mL — AB (ref 0–121)

## 2018-07-22 LAB — TROPONIN I: Troponin I: 0.07 ng/mL (ref 0.00–0.04)

## 2018-07-22 NOTE — Telephone Encounter (Signed)
Dr Caryl Comes aware. Will be calling pt to update on future plans.

## 2018-07-23 NOTE — Telephone Encounter (Signed)
Dr Caryl Comes to call pt and discuss.

## 2018-07-28 NOTE — Telephone Encounter (Signed)
Called and spoke with patient  Office visit to be set up

## 2018-08-06 ENCOUNTER — Encounter: Payer: Self-pay | Admitting: Internal Medicine

## 2018-08-06 ENCOUNTER — Ambulatory Visit (INDEPENDENT_AMBULATORY_CARE_PROVIDER_SITE_OTHER): Payer: BLUE CROSS/BLUE SHIELD | Admitting: Internal Medicine

## 2018-08-06 VITALS — BP 110/76 | HR 44 | Ht 79.0 in | Wt 251.6 lb

## 2018-08-06 DIAGNOSIS — I484 Atypical atrial flutter: Secondary | ICD-10-CM | POA: Diagnosis not present

## 2018-08-06 DIAGNOSIS — D8685 Sarcoid myocarditis: Secondary | ICD-10-CM

## 2018-08-06 DIAGNOSIS — I5022 Chronic systolic (congestive) heart failure: Secondary | ICD-10-CM

## 2018-08-06 NOTE — Patient Instructions (Signed)
Medication Instructions:  Your physician recommends that you continue on your current medications as directed. Please refer to the Current Medication list given to you today.  Labwork: None ordered.  Testing/Procedures: None ordered.  Follow-Up: Your physician recommends that you schedule a follow-up appointment   As Needed with Dr Caryl Comes.  Any Other Special Instructions Will Be Listed Below (If Applicable).     If you need a refill on your cardiac medications before your next appointment, please call your pharmacy.

## 2018-08-06 NOTE — Progress Notes (Signed)
Patient Care Team: Patient, No Pcp Per as PCP - General (General Practice) Nahser, Wonda Cheng, MD as PCP - Cardiology (Cardiology) Thompson Grayer, MD as Attending Physician (Cardiology)   HPI  Ricardo Jones is a 48 y.o. 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   I spoke with cardiology [facility]-Dr. Cooper-about the scenario.  He recommended 2 courses.  The first is empiric therapy for presumed sarcoid; endpoints are clearly a challenge.  Not resolving the issue of endpoints, unclarifying diagnosis, [facility] has developed protocols for voltage guided biopsies.  I reviewed these briefly with the patient on the telephone  He also has atrial flutter with a gradually slowing cycle length  DATE TEST EF   7/15 Echo   50-55 % Severe RAE/LAE  5/18 Echo  40-45 % Mild RAE/LAE  10/19 Echo  40.-45%   11/19 cMRI  Severe BAE DGE+>Sarcoid  12/18 cPET  + Consistent Sarcoid    Thromboembolic risk factors (CHF-1 *) for a CHADSVASc Score of sort of 1     Records and Results Reviewed*   Past Medical History:  Diagnosis Date  . Bradycardia    asymptomatic  . S/P dilatation of esophageal stricture   . Typical atrial flutter [facility])     Past Surgical History:  Procedure Laterality Date  . THROAT SURGERY  2012    No outpatient medications have been marked as taking for the 08/06/18 encounter (Office Visit) with Deboraha Sprang, MD.    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  He is at this point disinclined to pursue electrogram guided biopsy for the sake of clarifying the diagnosis.  The recommendation from [facility] was that, if the biopsy were undertaken and were negative, he anticipates that they would embark on sarcoid related therapy with prednisone, folate and methotrexate.  We spent a long time addressing the need for certainty, or his uncertainty as possible and evaluate [facility] in that regard.  For now, he would like not to pursue that knowing that is available always in the future  We also discussed the role of an ICD; in the context of complete heart block and modest LV dysfunction pacing is indicated and with LV dysfunction resynchronization would be appropriate.   We discussed pacing options including CRT, His bundle pacing and left bundle branch area pacing newly described.  I do not know if anyone around here is doing the latter.  Following device implantation would recommend flutter ablation.  That being the case, it is sinus function not been normal, not withstanding complete heart block with implant an atrial lead.  More than 50% of  65 min was spent in counseling related to the above      Current medicines are reviewed at length with the patient today .  The patient does not  have concerns regarding medicines.

## 2018-08-08 ENCOUNTER — Telehealth: Payer: Self-pay

## 2018-08-08 NOTE — Telephone Encounter (Signed)
-----   Message from Juanito Doom, MD sent at 08/07/2018  8:30 AM EST ----- Thanks for passing along Ricardo Jones, Can you ask the patient if he plans to come back to see [place] to discuss a treatment plan and to confirm an appointment? Can used held spot if necessary. Ricardo Jones ----- Message ----- From: Deboraha Sprang, MD Sent: 08/06/2018   6:16 PM EST To: Juanito Doom, MD

## 2018-08-08 NOTE — Telephone Encounter (Signed)
noted 

## 2018-08-08 NOTE — Telephone Encounter (Signed)
Spoke with pt, states that he does not plan on following up with pulmonary at this time.  Forwarding to BQ as FYI.

## 2018-08-14 ENCOUNTER — Telehealth: Payer: Self-pay | Admitting: Internal Medicine

## 2018-08-14 NOTE — Telephone Encounter (Signed)
Patient's wife Ishmael Holter called stating they were suppose to hear back from Dr Caryl Comes or his nurse with a care plan and have not. So she was calling to check on the status of that.  She said if you can't reach her, you can call [place] at [phone].

## 2018-08-15 NOTE — Telephone Encounter (Signed)
Dr Caryl Comes LVM for pt to return call.

## 2018-08-19 ENCOUNTER — Telehealth: Payer: Self-pay | Admitting: Internal Medicine

## 2018-08-19 NOTE — Telephone Encounter (Signed)
Spoke with pt today who has concerns over some recent mid line abd pain, abd muscle pain, possible indigestion. Pt denies any sx of typical chest, back, shoulder, or neck pain. He states there is some illness in the house and wonders if this is why he is not feeling well.   I advised pt his sx did not sound cardiac related, but I would review with Dr Caryl Comes for recommendations. I advised him to call his PCP for recommendation as well.   Pt verbalized understanding and had no additional questions.

## 2018-08-19 NOTE — Telephone Encounter (Signed)
Pt recently seen by Dr. Caryl Comes. See pt advice sent today.

## 2018-08-19 NOTE — Telephone Encounter (Signed)
New message     Pt just stated that he wants to speak with nurse regarding heart . Nothing else was said

## 2018-08-20 ENCOUNTER — Encounter (HOSPITAL_COMMUNITY): Payer: BLUE CROSS/BLUE SHIELD | Admitting: Internal Medicine

## 2018-08-27 ENCOUNTER — Telehealth: Payer: Self-pay | Admitting: Internal Medicine

## 2018-08-27 NOTE — Telephone Encounter (Signed)
Patient needs to discuss the scheduling of a surgery he needs to have done, would like to speak to nurse.

## 2018-08-27 NOTE — Telephone Encounter (Signed)
Spoke with pt and he has agreed to have his ICD implant on March 6th @ 1130am. Pre procedural instructions were verbally reviewed with pt as well as sent through [place].   Pt has requested Dr Caryl Comes to review starting his steroids and folate regimen. He understands I will discuss this with Dr Caryl Comes upon his return to the office Monday Feb 17.

## 2018-08-27 NOTE — Telephone Encounter (Signed)
I will send to Dr. Olin Pia nurse Clois Dupes RN as she has been speaking with the pt through [place]. Pt wants to talk to [place] on the phone and not through [place].

## 2018-08-27 NOTE — Telephone Encounter (Signed)
LVM for return call. 

## 2018-09-07 ENCOUNTER — Emergency Department (HOSPITAL_COMMUNITY): Payer: BLUE CROSS/BLUE SHIELD

## 2018-09-07 ENCOUNTER — Other Ambulatory Visit: Payer: Self-pay

## 2018-09-07 ENCOUNTER — Inpatient Hospital Stay (HOSPITAL_COMMUNITY)
Admission: EM | Admit: 2018-09-07 | Discharge: 2018-09-09 | DRG: 227 | Disposition: A | Discharge status: Home / Self Care | Payer: BLUE CROSS/BLUE SHIELD | Attending: Internal Medicine | Admitting: Internal Medicine

## 2018-09-07 ENCOUNTER — Encounter (HOSPITAL_COMMUNITY): Payer: Self-pay

## 2018-09-07 DIAGNOSIS — R079 Chest pain, unspecified: Secondary | ICD-10-CM | POA: Diagnosis not present

## 2018-09-07 DIAGNOSIS — D8685 Sarcoid myocarditis: Secondary | ICD-10-CM | POA: Diagnosis not present

## 2018-09-07 DIAGNOSIS — I442 Atrioventricular block, complete: Principal | ICD-10-CM | POA: Diagnosis present

## 2018-09-07 DIAGNOSIS — I428 Other cardiomyopathies: Secondary | ICD-10-CM | POA: Diagnosis not present

## 2018-09-07 DIAGNOSIS — Z8481 Family history of carrier of genetic disease: Secondary | ICD-10-CM

## 2018-09-07 DIAGNOSIS — Z95810 Presence of automatic (implantable) cardiac defibrillator: Secondary | ICD-10-CM | POA: Diagnosis not present

## 2018-09-07 DIAGNOSIS — R5382 Chronic fatigue, unspecified: Secondary | ICD-10-CM | POA: Diagnosis not present

## 2018-09-07 DIAGNOSIS — I483 Typical atrial flutter: Secondary | ICD-10-CM | POA: Diagnosis present

## 2018-09-07 DIAGNOSIS — Z8249 Family history of ischemic heart disease and other diseases of the circulatory system: Secondary | ICD-10-CM

## 2018-09-07 DIAGNOSIS — Z825 Family history of asthma and other chronic lower respiratory diseases: Secondary | ICD-10-CM

## 2018-09-07 DIAGNOSIS — R0602 Shortness of breath: Secondary | ICD-10-CM | POA: Diagnosis not present

## 2018-09-07 DIAGNOSIS — I5022 Chronic systolic (congestive) heart failure: Secondary | ICD-10-CM | POA: Diagnosis not present

## 2018-09-07 DIAGNOSIS — J811 Chronic pulmonary edema: Secondary | ICD-10-CM | POA: Diagnosis not present

## 2018-09-07 DIAGNOSIS — R001 Bradycardia, unspecified: Secondary | ICD-10-CM | POA: Diagnosis not present

## 2018-09-07 LAB — CBC WITH DIFFERENTIAL/PLATELET
Abs Immature Granulocytes: 0.01 10*3/uL (ref 0.00–0.07)
BASOS PCT: 1 %
Basophils Absolute: 0 10*3/uL (ref 0.0–0.1)
Eosinophils Absolute: 0.2 10*3/uL (ref 0.0–0.5)
Eosinophils Relative: 3 %
HCT: 47.8 % (ref 39.0–52.0)
Hemoglobin: 15.5 g/dL (ref 13.0–17.0)
Immature Granulocytes: 0 %
Lymphocytes Relative: 37 %
Lymphs Abs: 2 10*3/uL (ref 0.7–4.0)
MCH: 29.6 pg (ref 26.0–34.0)
MCHC: 32.4 g/dL (ref 30.0–36.0)
MCV: 91.2 fL (ref 80.0–100.0)
Monocytes Absolute: 0.5 10*3/uL (ref 0.1–1.0)
Monocytes Relative: 8 %
NEUTROS ABS: 2.7 10*3/uL (ref 1.7–7.7)
Neutrophils Relative %: 51 %
Platelets: 113 10*3/uL — ABNORMAL LOW (ref 150–400)
RBC: 5.24 MIL/uL (ref 4.22–5.81)
RDW: 12.8 % (ref 11.5–15.5)
WBC: 5.3 10*3/uL (ref 4.0–10.5)
nRBC: 0 % (ref 0.0–0.2)

## 2018-09-07 LAB — COMPREHENSIVE METABOLIC PANEL
ALT: 39 U/L (ref 0–44)
ANION GAP: 8 (ref 5–15)
AST: 30 U/L (ref 15–41)
Albumin: 4.3 g/dL (ref 3.5–5.0)
Alkaline Phosphatase: 98 U/L (ref 38–126)
BUN: 21 mg/dL — ABNORMAL HIGH (ref 6–20)
CO2: 26 mmol/L (ref 22–32)
Calcium: 9.5 mg/dL (ref 8.9–10.3)
Chloride: 106 mmol/L (ref 98–111)
Creatinine, Ser: 1.27 mg/dL — ABNORMAL HIGH (ref 0.61–1.24)
GFR calc Af Amer: >60 mL/min (ref >60)
GFR calc non Af Amer: >60 mL/min (ref >60)
Glucose, Bld: 93 mg/dL (ref 70–99)
POTASSIUM: 4.2 mmol/L (ref 3.5–5.1)
SODIUM: 140 mmol/L (ref 135–145)
Total Bilirubin: 1.5 mg/dL — ABNORMAL HIGH (ref 0.3–1.2)
Total Protein: 6.8 g/dL (ref 6.5–8.1)

## 2018-09-07 LAB — MAGNESIUM: Magnesium: 1.9 mg/dL (ref 1.7–2.4)

## 2018-09-07 LAB — I-STAT TROPONIN, ED: Troponin i, poc: 0.05 ng/mL (ref 0.00–0.08)

## 2018-09-07 LAB — PHOSPHORUS: PHOSPHORUS: 3.4 mg/dL (ref 2.5–4.6)

## 2018-09-07 MED ORDER — SODIUM CHLORIDE 0.9% FLUSH
3.0000 mL | Freq: Two times a day (BID) | INTRAVENOUS | Status: DC
  Administered 2018-09-07 – 2018-09-09 (×3): 3 mL via INTRAVENOUS

## 2018-09-07 MED ORDER — ATROPINE SULFATE 1 MG/10ML IJ SOSY: 1.0000 mg | PREFILLED_SYRINGE | INTRAMUSCULAR | Status: DC | PRN

## 2018-09-07 MED ORDER — ONDANSETRON HCL 4 MG/2ML IJ SOLN: 4.0000 mg | Freq: Four times a day (QID) | INTRAMUSCULAR | Status: DC | PRN

## 2018-09-07 MED ORDER — ACETAMINOPHEN 325 MG PO TABS
650.0000 mg | ORAL_TABLET | ORAL | Status: DC | PRN
  Administered 2018-09-09: 650 mg via ORAL

## 2018-09-07 MED ORDER — SODIUM CHLORIDE 0.9 % IV SOLN: 250.0000 mL | INTRAVENOUS | Status: DC | PRN

## 2018-09-07 MED ORDER — SODIUM CHLORIDE 0.9% FLUSH: 3.0000 mL | INTRAVENOUS | Status: DC | PRN

## 2018-09-07 NOTE — ED Triage Notes (Signed)
Pt from home w/ a c/o chest pain, SOB, nausea, and dizziness. The chest pain is intermittent, centrally located, and feels like "heart burn." The pain radiates bilaterally to the lateral side of his rib cage. Currently denies pain.   The pt is due to have a pacemaker placed on 09/22/18. His cardiologist is Dr. Caryl Comes.

## 2018-09-07 NOTE — H&P (Signed)
CARDIOLOGY H&P  HPI: Ricardo Jones is a 48 y.o. male w/ history of atrial flutter and cardiac sarcoid c/b chronic complete heart block who presents with fatigue.   Patient has been followed by Dr. Caryl Comes and Dr. Acie Fredrickson.  He has been known to be in complete heart block with slow ventricular rates in the low 30s for years now, dating back to at least 2013.  He was diagnosed with cardiac sarcoid in November 2019 after an MRI revealed borderline LV systolic function (EF 42%) and severe biatrial dilation.  Given his new LV dysfunction and bradycardia, he saw Dr. Caryl Comes and a biventricular pacemaker implant was scheduled for March 9.  Over the past several weeks the patient reports feeling more fatigued than usual.  He has continued performing all of his ADLs and IADLs.  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  He simply feels more fatigued and some degree of malaise, which he believes is unusual for him.  He has been exercising normally and was able to ride a stationary bike for over 30 minutes several days ago without any symptoms.  He denies any symptoms of syncope, presyncope, chest pain, chest pressure, shortness of breath, orthopnea, PND.  Given his worsening fatigue, he wanted to come to the emergency department tonight to be evaluated.  In the ED, the patient was found to be in complete heart block with heart rates in the low 30s similar to multiple prior ECGs.  His vital signs were otherwise unremarkable.  His labs were unremarkable as well.  Review of Systems:     Cardiac Review of Systems: {Y] = yes [ ]  = no  Chest Pain [    ]  Resting SOB [   ] Exertional SOB  [  ]  Orthopnea [  ]   Pedal Edema [   ]    Palpitations [  ] Syncope  [  ]   Presyncope [   ]  General Review of Systems: [Y] = yes [  ]=no Constitional: recent weight change [  ]; anorexia [  ]; fatigue [Y]; nausea [  ]; night sweats [  ]; fever [  ]; or chills [  ];                                                                      Dental: poor dentition[  ];   Eye : blurred vision [  ]; diplopia [   ]; vision changes [  ];  Amaurosis fugax[  ]; Resp: cough [  ];  wheezing[  ];  hemoptysis[  ]; shortness of breath[  ]; paroxysmal nocturnal dyspnea[  ]; dyspnea on exertion[  ]; or orthopnea[  ];  GI:  gallstones[  ], vomiting[  ];  dysphagia[  ]; melena[  ];  hematochezia [  ]; heartburn[  ];   GU: kidney stones [  ]; hematuria[  ];   dysuria [  ];  nocturia[  ];               Skin: rash [  ], swelling[  ];, hair loss[  ];  peripheral edema[  ];  or itching[  ]; Musculosketetal: myalgias[  ];  joint swelling[  ];  joint erythema[  ];  joint pain[  ];  back pain[  ];  Heme/Lymph: bruising[  ];  bleeding[  ];  anemia[  ];  Neuro: TIA[  ];  headaches[  ];  stroke[  ];  vertigo[  ];  seizures[  ];   paresthesias[  ];  difficulty walking[  ];  Psych:depression[  ]; anxiety[  ];  Endocrine: diabetes[  ];  thyroid dysfunction[  ];  Other:  Past Medical History:  Diagnosis Date  . Bradycardia    asymptomatic  . S/P dilatation of esophageal stricture   . Typical atrial flutter ([place])     Home meds:  - None   No Known Allergies  Social History   Socioeconomic History  . Marital status: Single    Spouse name: Not on file  . Number of children: Not on file  . Years of education: Not on file  . Highest education level: Not on file  Occupational History  . Not on file  Social Needs  . Financial resource strain: Not on file  . Food insecurity:    Worry: Not on file    Inability: Not on file  . Transportation needs:    Medical: Not on file    Non-medical: Not on file  Tobacco Use  . Smoking status: Never Smoker  . Smokeless tobacco: Never Used  Substance and Sexual Activity  . Alcohol use: Yes    Comment: occassionally  . Drug use: No  . Sexual activity: Not on file  Lifestyle  . Physical activity:    Days per week: Not on file    Minutes per session: Not on file  . Stress: Not on file    Relationships  . Social connections:    Talks on phone: Not on file    Gets together: Not on file    Attends religious service: Not on file    Active member of club or organization: Not on file    Attends meetings of clubs or organizations: Not on file    Relationship status: Not on file  . Intimate partner violence:    Fear of current or ex partner: Not on file    Emotionally abused: Not on file    Physically abused: Not on file    Forced sexual activity: Not on file  Other Topics Concern  . 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 Bradycardia Other        multiple family members with pacemakers  . Asthma Mother   . Cancer Maternal Grandmother   . Cancer Maternal Grandfather   . Cancer Paternal Grandmother     PHYSICAL EXAM: Vitals:   09/07/18 1935 09/07/18 1945  BP:  125/74  Pulse: (!) 30 (!) 32  Resp: 18 20  SpO2: 98% 96%   General:  Well appearing. No respiratory difficulty HEENT: normal Neck: supple. no JVD. Carotids 2+ bilat; no bruits. No lymphadenopathy or thryomegaly appreciated. Cor: PMI nondisplaced. Markedly bradycardic. No rubs, gallops or murmurs. Lungs: clear Abdomen: soft, nontender, nondistended. No hepatosplenomegaly. No bruits or masses. Good bowel sounds. Extremities: no cyanosis, clubbing, rash; 1+ pitting edema of the bilateral ankles unchanged from baseline Neuro: alert & oriented x 3, cranial nerves grossly intact. moves all 4 extremities w/o difficulty. Affect pleasant.  ECG: Bradycardia, heart rate 32 bpm.  Atypical atrial flutter, ventricular  rhythm with left bundle branch block pattern, complete heart block  Results for orders placed or performed during the hospital encounter of 09/07/18 (from the past 24 hour(s))  Comprehensive metabolic panel     Status: Abnormal   Collection Time: 09/07/18  7:50 PM  Result  Value Ref Range   Sodium 140 135 - 145 mmol/L   Potassium 4.2 3.5 - 5.1 mmol/L   Chloride 106 98 - 111 mmol/L   CO2 26 22 - 32 mmol/L   Glucose, Bld 93 70 - 99 mg/dL   BUN 21 (H) 6 - 20 mg/dL   Creatinine, Ser 1.27 (H) 0.61 - 1.24 mg/dL   Calcium 9.5 8.9 - 10.3 mg/dL   Total Protein 6.8 6.5 - 8.1 g/dL   Albumin 4.3 3.5 - 5.0 g/dL   AST 30 15 - 41 U/L   ALT 39 0 - 44 U/L   Alkaline Phosphatase 98 38 - 126 U/L   Total Bilirubin 1.5 (H) 0.3 - 1.2 mg/dL   GFR calc non Af Amer >60 >60 mL/min   GFR calc Af Amer >60 >60 mL/min   Anion gap 8 5 - 15  Magnesium     Status: None   Collection Time: 09/07/18  7:50 PM  Result Value Ref Range   Magnesium 1.9 1.7 - 2.4 mg/dL  Phosphorus     Status: None   Collection Time: 09/07/18  7:50 PM  Result Value Ref Range   Phosphorus 3.4 2.5 - 4.6 mg/dL  I-stat troponin, ED     Status: None   Collection Time: 09/07/18  7:55 PM  Result Value Ref Range   Troponin i, poc 0.05 0.00 - 0.08 ng/mL   Comment 3           Dg Chest Port 1 View  Result Date: 09/07/2018 CLINICAL DATA:  Chest pain, shortness of breath EXAM: PORTABLE CHEST 1 VIEW COMPARISON:  Chest CT 07/07/2018 FINDINGS: Cardiomegaly. No confluent opacities, effusions or edema. No acute bony abnormality. IMPRESSION: Cardiomegaly.  No active disease. Electronically Signed   By: Rolm Baptise M.D.   On: 09/07/2018 20:17   ASSESSMENT: In summary, this is a 48 year old male with a known history of cardiac sarcoidosis and longstanding history of complete heart block who comes to the emergency department with several weeks of worsening fatigue.  The patient does not appear to have any clinical evidence of decompensated heart failure and has ruled out for acute coronary syndrome.  Given his borderline LV function and market bradycardia, it seems likely that his symptoms are attributable to a combination of electrical and mechanical cardiac dysfunction.  I had a long discussion with the patient and his  family regarding options for his care.  We discussed him staying in the hospital to meet with the inpatient electrophysiology team and potentially have his biventricular pacemaker implant performed sooner than it is currently scheduled date of March 9.  Alternatively, it would be reasonable for the patient to go home and contact Dr. Olin Pia office to discuss scheduling a sooner date.  Ultimately, the I discussed the patient's case with Dr. Crissie Sickles who agrees that the patient would benefit from admission to the hospital.  Dr. Lovena Le noted that he would discuss the patient's case with Dr. Caryl Comes in the morning and come up with a strategy for moving forward.  PLAN/DISCUSSION: - admit to cardiology service under observation status, will change to inpatient if pacemaker implant will be done as inpatient - NPO past midnight tonight -  no home medications to continue - monitor electrolytes - atropine PRN only on order from on call provider  Marcie Mowers, MD Cardiology Fellow, PGY-6

## 2018-09-07 NOTE — Plan of Care (Signed)
Care plan has been reviewed:   Problem: Clinical Measurements: HR 30-40s, complete heart block on monitor, shift complaint was feeling fatigue and weak for weeks prior admission. Goal: Cardiovascular complication will be avoided Outcome: Progressing: BP remained stable, Pt was asymptomatic at arrival to [place] unit. Will continue to monitor.  Problem: Education: plan of care has been explained, MD's note indicated will move his schedule for biventricular permanent  pacemaker implant sooner. Goal: Knowledge of General Education information will improve Outcome: Progressing: Pt was oriented to [facility] [address] unit. Pt was cooperative with plan of care, general education given at arrival, CHG bath given, CCMD was called  with second person verified EKG continue monitoring. Family members , Pt's mother and wife were supportive at bedside. Will discuss about procedure with MD in the morning.   Problem: Pain Managment: got report from ED Pt had chest pain and shortness of breath. Goal: General experience of comfort will improve Outcome: Progressing: pain free at arrival. Educated including pain rating scale, medication(s)/side effects and non-pharmacologic comfort measures.   Kennyth Lose, BSN,RN,PCCN-CMC

## 2018-09-07 NOTE — ED Provider Notes (Signed)
[place] EMERGENCY DEPARTMENT Provider Note   CSN: [id number] Arrival date & time: 09/07/18  1924    History   Chief Complaint Chief Complaint  Patient presents with  . Chest Pain    HPI JEFFREE CAZEAU is a 48 y.o. male history of complete heart block here presenting with chest pain, dizziness.  Patient was diagnosed with complete heart block about 6 months ago.  Patient states that he has progressive worsening dizziness and chest pressure.  Patient states that he has some shortness of breath at night.  He denies any leg swelling or loss of consciousness.  Patient is scheduled for ICD placement and possible ablation on March 9 but his symptoms got worse so he came here for evaluation.     The history is provided by the patient.    Past Medical History:  Diagnosis Date  . Bradycardia    asymptomatic  . S/P dilatation of esophageal stricture   . Typical atrial flutter [facility])     Patient Active Problem List   Diagnosis Date Noted  . Chronic systolic CHF (congestive heart failure) ([place]) 07/07/2018  . Cardiac sarcoidosis 07/07/2018  . Tinea pedis of both feet 02/12/2018  . Onychomycosis 02/12/2018  . Chronic atrial flutter ([place]) 11/01/2016  . ATRIAL FLUTTER 09/28/2010  . BRADYCARDIA, CHRONIC 09/28/2010    Past Surgical History:  Procedure Laterality Date  . THROAT SURGERY  2012        Home Medications    Prior to Admission medications   Not on File    Family History Family History  Problem Relation Age of Onset  . Bradycardia Other        multiple family members with pacemakers  . Asthma Mother   . Cancer Maternal Grandmother   . Cancer Maternal Grandfather   . Cancer Paternal Grandmother     Social History Social History   Tobacco Use  . Smoking status: Never Smoker  . Smokeless tobacco: Never Used  Substance Use Topics  . Alcohol use: Yes    Comment: occassionally  . Drug use: No     Allergies   Patient has no known  allergies.   Review of Systems Review of Systems  Cardiovascular: Positive for chest pain.  All other systems reviewed and are negative.    Physical Exam Updated Vital Signs BP 125/74   Pulse (!) 32   Resp 20   Ht 6\' 7"  (2.007 m)   Wt 108.9 kg   SpO2 96%   BMI 27.04 kg/m   Physical Exam Vitals signs and nursing note reviewed.  HENT:     Head: Normocephalic.  Eyes:     Extraocular Movements: Extraocular movements intact.     Pupils: Pupils are equal, round, and reactive to light.  Neck:     Musculoskeletal: Normal range of motion.  Cardiovascular:     Rate and Rhythm: Bradycardia present. Rhythm irregular.     Heart sounds: Normal heart sounds.  Pulmonary:     Effort: Pulmonary effort is normal.  Abdominal:     General: Bowel sounds are normal.     Palpations: Abdomen is soft.  Musculoskeletal: Normal range of motion.  Skin:    General: Skin is warm.     Capillary Refill: Capillary refill takes less than 2 seconds.  Neurological:     General: No focal deficit present.     Mental Status: He is alert and oriented to person, place, and time.  Psychiatric:  Mood and Affect: Mood normal.        Behavior: Behavior normal.      ED Treatments / Results  Labs (all labs ordered are listed, but only abnormal results are displayed) Labs Reviewed  COMPREHENSIVE METABOLIC PANEL - Abnormal; Notable for the following components:      Result Value   BUN 21 (*)    Creatinine, Ser 1.27 (*)    Total Bilirubin 1.5 (*)    All other components within normal limits  MAGNESIUM  PHOSPHORUS  CBC WITH DIFFERENTIAL/PLATELET  I-STAT TROPONIN, ED    EKG EKG Interpretation  Date/Time:  Sunday September 07 2018 19:34:13 EST Ventricular Rate:  34 PR Interval:    QRS Duration: 128 QT Interval:  518 QTC Calculation: 384 R Axis:   -83 Text Interpretation:  AV block, complete (third degree) Ventricular premature complex Nonspecific IVCD with LAD Inferior infarct, old  Anterolateral infarct, age indeterminate third degree heart block new and rate slower than previous  Confirmed by Wandra Arthurs ([id number]) on 09/07/2018 7:38:20 PM   Radiology Dg Chest Port 1 View  Result Date: 09/07/2018 CLINICAL DATA:  Chest pain, shortness of breath EXAM: PORTABLE CHEST 1 VIEW COMPARISON:  Chest CT 07/07/2018 FINDINGS: Cardiomegaly. No confluent opacities, effusions or edema. No acute bony abnormality. IMPRESSION: Cardiomegaly.  No active disease. Electronically Signed   By: Rolm Baptise M.D.   On: 09/07/2018 20:17    Procedures Procedures (including critical care time)  CRITICAL CARE Performed by: Wandra Arthurs   Total critical care time: 30 minutes  Critical care time was exclusive of separately billable procedures and treating other patients.  Critical care was necessary to treat or prevent imminent or life-threatening deterioration.  Critical care was time spent personally by me on the following activities: development of treatment plan with patient and/or surrogate as well as nursing, discussions with consultants, evaluation of patient's response to treatment, examination of patient, obtaining history from patient or surrogate, ordering and performing treatments and interventions, ordering and review of laboratory studies, ordering and review of radiographic studies, pulse oximetry and re-evaluation of patient's condition.   Medications Ordered in ED Medications - No data to display   Initial Impression / Assessment and Plan / ED Course  I have reviewed the triage vital signs and the nursing notes.  Pertinent labs & imaging results that were available during my care of the patient were reviewed by me and considered in my medical decision making (see chart for details).       DJIBRIL GLOGOWSKI is a 48 y.o. male here with symptomatic bradycardia.  Patient had complete heart block for the last 6 months but now has symptomatic.  Patient has normal blood pressure  currently and is mentating well. Placed on a zol pad.  Electrolytes unremarkable.  I consulted cardiology to admit for symptomatic bradycardia, complete heart block.   Final Clinical Impressions(s) / ED Diagnoses   Final diagnoses:  Complete heart block [facility])    ED Discharge Orders    None       Drenda Freeze, MD 09/07/18 2101

## 2018-09-08 ENCOUNTER — Encounter (HOSPITAL_COMMUNITY)
Admission: EM | Disposition: A | Discharge status: Home / Self Care | Payer: Self-pay | Source: Home / Self Care | Attending: Internal Medicine

## 2018-09-08 DIAGNOSIS — J811 Chronic pulmonary edema: Secondary | ICD-10-CM | POA: Diagnosis not present

## 2018-09-08 DIAGNOSIS — Z8249 Family history of ischemic heart disease and other diseases of the circulatory system: Secondary | ICD-10-CM | POA: Diagnosis not present

## 2018-09-08 DIAGNOSIS — Z8481 Family history of carrier of genetic disease: Secondary | ICD-10-CM | POA: Diagnosis not present

## 2018-09-08 DIAGNOSIS — I428 Other cardiomyopathies: Secondary | ICD-10-CM | POA: Diagnosis not present

## 2018-09-08 DIAGNOSIS — I442 Atrioventricular block, complete: Secondary | ICD-10-CM | POA: Diagnosis not present

## 2018-09-08 DIAGNOSIS — D8685 Sarcoid myocarditis: Secondary | ICD-10-CM | POA: Diagnosis present

## 2018-09-08 DIAGNOSIS — Z825 Family history of asthma and other chronic lower respiratory diseases: Secondary | ICD-10-CM | POA: Diagnosis not present

## 2018-09-08 DIAGNOSIS — I483 Typical atrial flutter: Secondary | ICD-10-CM | POA: Diagnosis not present

## 2018-09-08 DIAGNOSIS — Z95810 Presence of automatic (implantable) cardiac defibrillator: Secondary | ICD-10-CM | POA: Diagnosis not present

## 2018-09-08 DIAGNOSIS — I5022 Chronic systolic (congestive) heart failure: Secondary | ICD-10-CM | POA: Diagnosis not present

## 2018-09-08 HISTORY — PX: BIV ICD INSERTION CRT-D: EP1195

## 2018-09-08 LAB — BASIC METABOLIC PANEL
Anion gap: 10 (ref 5–15)
BUN: 19 mg/dL (ref 6–20)
CHLORIDE: 104 mmol/L (ref 98–111)
CO2: 25 mmol/L (ref 22–32)
Calcium: 9.1 mg/dL (ref 8.9–10.3)
Creatinine, Ser: 1.09 mg/dL (ref 0.61–1.24)
GFR calc Af Amer: >60 mL/min (ref >60)
GFR calc non Af Amer: >60 mL/min (ref >60)
Glucose, Bld: 137 mg/dL — ABNORMAL HIGH (ref 70–99)
POTASSIUM: 3.6 mmol/L (ref 3.5–5.1)
Sodium: 139 mmol/L (ref 135–145)

## 2018-09-08 LAB — SURGICAL PCR SCREEN
MRSA, PCR: NEGATIVE
Staphylococcus aureus: POSITIVE — AB

## 2018-09-08 LAB — CBC
HEMATOCRIT: 44.4 % (ref 39.0–52.0)
HEMOGLOBIN: 14.3 g/dL (ref 13.0–17.0)
MCH: 28.9 pg (ref 26.0–34.0)
MCHC: 32.2 g/dL (ref 30.0–36.0)
MCV: 89.7 fL (ref 80.0–100.0)
Platelets: 103 10*3/uL — ABNORMAL LOW (ref 150–400)
RBC: 4.95 MIL/uL (ref 4.22–5.81)
RDW: 12.7 % (ref 11.5–15.5)
WBC: 4.8 10*3/uL (ref 4.0–10.5)
nRBC: 0 % (ref 0.0–0.2)

## 2018-09-08 LAB — ABO/RH: ABO/RH(D): B NEG

## 2018-09-08 LAB — PROTIME-INR
INR: 1.16
PROTHROMBIN TIME: 14.7 s (ref 11.4–15.2)

## 2018-09-08 LAB — TYPE AND SCREEN
ABO/RH(D): B NEG
Antibody Screen: NEGATIVE

## 2018-09-08 LAB — APTT: APTT: 31 s (ref 24–36)

## 2018-09-08 LAB — HIV ANTIBODY (ROUTINE TESTING W REFLEX): HIV Screen 4th Generation wRfx: NONREACTIVE

## 2018-09-08 SURGERY — BIV ICD INSERTION CRT-D
Anesthesia: LOCAL

## 2018-09-08 MED ORDER — HEPARIN (PORCINE) IN NACL 1000-0.9 UT/500ML-% IV SOLN
INTRAVENOUS | Status: DC | PRN
  Administered 2018-09-08: 500 mL

## 2018-09-08 MED ORDER — SODIUM CHLORIDE 0.9 % IV SOLN
INTRAVENOUS | Status: DC
  Administered 2018-09-08: 13:00:00 via INTRAVENOUS

## 2018-09-08 MED ORDER — LIDOCAINE HCL (PF) 1 % IJ SOLN
INTRAMUSCULAR | Status: DC | PRN
  Administered 2018-09-08: 45 mL

## 2018-09-08 MED ORDER — ACETAMINOPHEN 325 MG PO TABS
325.0000 mg | ORAL_TABLET | ORAL | Status: DC | PRN
  Filled 2018-09-08: qty 2

## 2018-09-08 MED ORDER — SODIUM CHLORIDE 0.9 % IV SOLN
INTRAVENOUS | Status: AC
  Filled 2018-09-08: qty 2

## 2018-09-08 MED ORDER — MIDAZOLAM HCL 5 MG/5ML IJ SOLN
INTRAMUSCULAR | Status: AC
  Filled 2018-09-08: qty 5

## 2018-09-08 MED ORDER — CEFAZOLIN SODIUM-DEXTROSE 1-4 GM/50ML-% IV SOLN
1.0000 g | Freq: Four times a day (QID) | INTRAVENOUS | Status: AC
  Administered 2018-09-08 – 2018-09-09 (×3): 1 g via INTRAVENOUS
  Filled 2018-09-08 (×4): qty 50

## 2018-09-08 MED ORDER — IOHEXOL 350 MG/ML SOLN
INTRAVENOUS | Status: DC | PRN
  Administered 2018-09-08: 30 mL via INTRAVENOUS

## 2018-09-08 MED ORDER — MIDAZOLAM HCL 5 MG/5ML IJ SOLN
INTRAMUSCULAR | Status: DC | PRN
  Administered 2018-09-08: 1 mg via INTRAVENOUS
  Administered 2018-09-08: 2 mg via INTRAVENOUS
  Administered 2018-09-08 (×2): 1 mg
  Administered 2018-09-08: 1 mg via INTRAVENOUS

## 2018-09-08 MED ORDER — FENTANYL CITRATE (PF) 100 MCG/2ML IJ SOLN
INTRAMUSCULAR | Status: DC | PRN
  Administered 2018-09-08 (×2): 12.5 ug via INTRAVENOUS
  Administered 2018-09-08: 25 ug via INTRAVENOUS
  Administered 2018-09-08: 12.5 ug via INTRAVENOUS

## 2018-09-08 MED ORDER — METOPROLOL TARTRATE 5 MG/5ML IV SOLN
INTRAVENOUS | Status: AC
  Filled 2018-09-08: qty 5

## 2018-09-08 MED ORDER — SODIUM CHLORIDE 0.9 % IV SOLN
80.0000 mg | INTRAVENOUS | Status: AC
  Administered 2018-09-08: 80 mg

## 2018-09-08 MED ORDER — SODIUM CHLORIDE 0.9% FLUSH
3.0000 mL | Freq: Two times a day (BID) | INTRAVENOUS | Status: DC
  Administered 2018-09-08: 3 mL via INTRAVENOUS

## 2018-09-08 MED ORDER — CHLORHEXIDINE GLUCONATE 4 % EX LIQD: 60.0000 mL | Freq: Once | CUTANEOUS | Status: DC

## 2018-09-08 MED ORDER — SODIUM CHLORIDE 0.9 % IV SOLN: 250.0000 mL | INTRAVENOUS | Status: DC

## 2018-09-08 MED ORDER — HEPARIN (PORCINE) IN NACL 1000-0.9 UT/500ML-% IV SOLN
INTRAVENOUS | Status: AC
  Filled 2018-09-08: qty 500

## 2018-09-08 MED ORDER — CEFAZOLIN SODIUM-DEXTROSE 2-4 GM/100ML-% IV SOLN
INTRAVENOUS | Status: AC
  Filled 2018-09-08: qty 100

## 2018-09-08 MED ORDER — SODIUM CHLORIDE 0.9% FLUSH: 3.0000 mL | INTRAVENOUS | Status: DC | PRN

## 2018-09-08 MED ORDER — IOPAMIDOL (ISOVUE-370) INJECTION 76%
INTRAVENOUS | Status: AC
  Filled 2018-09-08: qty 50

## 2018-09-08 MED ORDER — ONDANSETRON HCL 4 MG/2ML IJ SOLN: 4.0000 mg | Freq: Four times a day (QID) | INTRAMUSCULAR | Status: DC | PRN

## 2018-09-08 MED ORDER — FENTANYL CITRATE (PF) 100 MCG/2ML IJ SOLN
INTRAMUSCULAR | Status: AC
  Filled 2018-09-08: qty 2

## 2018-09-08 MED ORDER — CEFAZOLIN SODIUM-DEXTROSE 2-4 GM/100ML-% IV SOLN
2.0000 g | INTRAVENOUS | Status: AC
  Administered 2018-09-08: 2 g via INTRAVENOUS

## 2018-09-08 MED ORDER — LIDOCAINE HCL (PF) 1 % IJ SOLN
INTRAMUSCULAR | Status: AC
  Filled 2018-09-08: qty 60

## 2018-09-08 MED ORDER — METOPROLOL TARTRATE 5 MG/5ML IV SOLN
INTRAVENOUS | Status: DC | PRN
  Administered 2018-09-08 (×2): 5 mg via INTRAVENOUS

## 2018-09-08 MED ORDER — CHLORHEXIDINE GLUCONATE 4 % EX LIQD
60.0000 mL | Freq: Once | CUTANEOUS | Status: AC
  Administered 2018-09-08: 4 via TOPICAL
  Filled 2018-09-08: qty 60

## 2018-09-08 MED ORDER — FENTANYL CITRATE (PF) 100 MCG/2ML IJ SOLN: 25.0000 ug | INTRAMUSCULAR | Status: DC | PRN

## 2018-09-08 SURGICAL SUPPLY — 19 items
ADAPTER SEALING SSA-EW-09 (MISCELLANEOUS) ×1 IMPLANT
CABLE SURGICAL S-101-97-12 (CABLE) ×2 IMPLANT
CATH ATTAIN COM SURV 6250V-EH (CATHETERS) ×1 IMPLANT
CATH HEX JOS 2-5-2 65CM 6F REP (CATHETERS) ×1 IMPLANT
HOVERMATT SINGLE USE (MISCELLANEOUS) ×1 IMPLANT
ICD CLARIA MRI DTMA1Q1 (ICD Generator) ×1 IMPLANT
KIT ESSENTIALS PG (KITS) ×1 IMPLANT
LEAD CAPSURE NOVUS 5076-52CM (Lead) ×1 IMPLANT
LEAD QUARTET 1458QL-86 (Lead) IMPLANT
LEAD SPRINT QUAT SEC 6935-65CM (Lead) ×1 IMPLANT
PAD PRO RADIOLUCENT 2001M-C (PAD) ×2 IMPLANT
QUARTET 1458QL-86 (Lead) ×2 IMPLANT
SHEATH CLASSIC 7F (SHEATH) ×1 IMPLANT
SHEATH CLASSIC 9.5F (SHEATH) ×1 IMPLANT
SHEATH CLASSIC 9F (SHEATH) ×1 IMPLANT
SLITTER 6232ADJ (MISCELLANEOUS) ×1 IMPLANT
TRAY PACEMAKER INSERTION (PACKS) ×2 IMPLANT
WIRE ACUITY WHISPER EDS 4648 (WIRE) ×1 IMPLANT
WIRE LUGE 182CM (WIRE) ×1 IMPLANT

## 2018-09-08 NOTE — Discharge Summary (Addendum)
ELECTROPHYSIOLOGY PROCEDURE DISCHARGE SUMMARY    Patient ID: Ricardo Jones,  MRN: [id number], DOB/AGE: 50-Apr-1972 50 y.o.  Admit date: 09/07/2018 Discharge date: 09/08/2018  Primary Care Physician: Patient, No Pcp Per Primary Cardiologist: Dr. Acie Fredrickson Electrophysiologist: Dr. Caryl Comes  Primary Discharge Diagnosis:  1. CHB 2. Aflutter     chads2vasc is one, not on a/c 3. Sarcoidosis 4. NICM  Secondary Discharge Diagnosis:  none  No Known Allergies   Procedures This Admission:  1.  Implantation of a MDT CRT-D on 09/08/2018 by Dr Lovena Le.  The patient received a Medtronic [id number] (serial # [id number] ) right atrial lead and a Medtronic [id number] (serial number [id number]) right ventricular defibrillator lead, Medtronic(serial number [id number]) lead (LV), medtronic (serial Number [id number]) biventricular ICD DFT's were deferred at time of implant.   There were no immediate post procedure complications. 2.  CXR on 09/08/2018 demonstrated no pneumothorax status post device implantation.   Brief HPI: Ricardo Jones is a 48 y.o. 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  He preferred to pursue implant of his device during his stay then wait for his out patient date.  He underwent implantation of an ICD with details as outlined above.He was monitored on telemetry overnight which demonstrated initially P tracking, with device reprogramming is VP at 70.  Left chest was without hematoma or ecchymosis.  The device  was interrogated and found to be functioning normally.  CXR was obtained and demonstrated no pneumothorax status post device implantation.  Wound care, arm mobility, and restrictions were reviewed with the patient.  The patient feels well, no CP or SOB, he was examined by Dr. Lovena Le and considered stable for discharge to home.  Out patient follow up is in place, including visit with Dr. Caryl Comes to discuss his AFlutter, possible ablation, timing to consider a/c post implant at that time, if/when they decide to pursue ablation, he will need peri-procedure. BP with my visit 127/90, he is instructed to monitor his BP at home   Physical Exam: Vitals:   09/09/18 0000 09/09/18 0400 09/09/18 0604 09/09/18 0823  BP: (!) 133/96 (!) 126/109  129/90  Pulse: (!) 111 (!) 117    Resp: 17 19 18  (!) 21  Temp: 98.3 F (36.8 C) 97.8 F (36.6 C)  98 F (36.7 C)  TempSrc: Oral Oral  Oral  SpO2: 97% 96%    Weight:   110.8 kg   Height:        GEN- The patient is well appearing, alert and oriented x 3 today.   HEENT: normocephalic, atraumatic; sclera clear, conjunctiva pink; hearing intact; oropharynx clear Lungs- CTA b/llly, normal work of breathing.  No wheezes, rales, rhonchi Heart- RRR, no murmurs, rubs or gallops, PMI not laterally displaced GI- soft, non-tender, non-distended Extremities- no clubbing, cyanosis, or edema MS- no significant deformity or atrophy Skin- warm and dry, no rash or lesion, left chest without hematoma/ecchymosis Psych- euthymic mood, full affect Neuro- no gross defecits  Labs:   Lab Results  Component Value Date  WBC 4.8 09/08/2018   HGB 14.3 09/08/2018   HCT 44.4 09/08/2018   MCV 89.7 09/08/2018   PLT 103 (L) 09/08/2018    Recent Labs  Lab 09/07/18 1950  09/09/18 0255  NA 140   < > 137  K 4.2   < > 4.4  CL 106   < > 105  CO2 26   < > 25  BUN 21*   < > 16  CREATININE 1.27*   < > 1.06  CALCIUM 9.5   < > 9.1  PROT 6.8  --   --   BILITOT 1.5*  --   --   ALKPHOS  98  --   --   ALT 39  --   --   AST 30  --   --   GLUCOSE 93   < > 96   < > = values in this interval not displayed.    Discharge Medications:  Allergies as of 09/09/2018   No Known Allergies     Medication List    You have not been prescribed any medications.     Disposition: Home  Discharge Instructions    Diet - low sodium heart healthy   Complete by:  As directed    Increase activity slowly   Complete by:  As directed      Follow-up Information    [place] [facility] Follow up.   Specialty:  Cardiology Why:  09/18/2018 @ 8:00AM, wound check visit Contact information: [address] [place]       Deboraha Sprang, MD Follow up.   Specialty:  Cardiology Why:  10/08/2018 @ 4:00PM, re-visit/discuss flutter ablation Contact information: [address] [address] [phone]           Duration of Discharge Encounter: Greater than 30 minutes including physician time.  Venetia Night, PA-C 09/09/2018 11:05 AM Discussed with Dr. Lake Bells strategy for immunosuppression.  Prednisone 60 mg daily, methotrexate 15 mg a week, Bactrim-DS Monday Wednesday Friday, and folate 1 mg daily.  However, with the news that his second-degree relative has a lamin deficiency, I have reached out to [facility] as to whether this could possibly be responsible for the imaging findings.  We will await to hear back from them prior to initiating immunosuppressive.  Symptomatic PVCs.  Much improved with ventricular pacing at 70.  We will anticipate subsequent atrial flutter ablation; interestingly his atrial flutter cycle length continues to slow incrementally over interval months

## 2018-09-08 NOTE — H&P (View-Only) (Signed)
Cardiology Consultation:   Patient ID: Ricardo Jones MRN: [id number]; DOB: 07/19/1970  Admit date: 09/07/2018 Date of Consult: 09/08/2018  Primary Care Provider: Patient, No Pcp Per Primary Cardiologist: Mertie Moores, MD  Primary Electrophysiologist:  Dr.Klein   Patient Profile:   Ricardo Jones is a 48 y.o. 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 Neena Rhymes.  History of Present Illness:   Ricardo Jones was seen by Dr. Caryl Comes last month for evaluation of his Flutter and CHB, as well as declining EF, looks like there was extensive discussion in regards to prior evaluation and recommendation from [facility] clinic, the patient did not want to pursue biopsy diagnosis, followed by with prednisone, folate and methotrexate (even if biopsy negative).  He has had PET imaging at [facility] 07/02/18: FINAL COMMENTS  There is a snmall are in the basal segment of the inferior with mildly  decreased perfusion. On the metabolic study there abnormal  FDG upteke consistent with inflamatory process of the myocardium. More  especificaly involving the basal-inferior, Basal infero  septal and mid infero-septal walls. In the porper clinical settings this  may represent active sarcoidosis in the myocardium. There  is no adenopathy in the chest. No pulmonary inflammation identified.  It seems recommended device implant, seems patient initially reluctant, though via phone notes, he was eventually planned for ICD implant, scheduled 09/22/2018.  He was admitted to [facility] with progressive weakness, fatigue.   LABS K+ 4.2 > 3.6 BUN/Creat 19/1.09 Mag 1.9 WBC 4.8 H/H 14/44 Plts 103  04/16/18 TSH 1.580  The patient reports that he has had HR 30's for 10 years, were limiting him or symptomatic with this.  And even of late with his recent work-ups, has been going to the gym regularly without changes to his exertional  capacity.  In the last week (perhaps 2), he has noted fluctuation in his HR, feeling it speed up then drop, these sudden drops are what he feels mostly, with a tingling sensation throughout his body and a lightheaded weak feeling.  He has not had syncope.  He wonders if since he has made the decision to proceed with implant that his mind isn't getting away from him/contributing to  His symptoms, but with increasing symptoms he decided to come in worried his HR/conduction may be worsening.  He would like to proceed with implant this stay. He has not had CP or SOB.  Has noted some fleeting L arm pain at times, these are fleeting, random, not positional or exertional  The patient was informed yesterday of family history: A 1st cousin who has had a heart transplant for DCM, tested + for LMNA gene mutation felt to be the etiology A sister has the mutation, but no cardiac issue Another sibling does not carry the mutation The patient has not been tested  Past Medical History:  Diagnosis Date  . Bradycardia    asymptomatic  . S/P dilatation of esophageal stricture   . Typical atrial flutter [facility])     Past Surgical History:  Procedure Laterality Date  . THROAT SURGERY  2012     Home Medications:  Prior to Admission medications   Not on File    Inpatient Medications: Scheduled Meds: . sodium chloride flush  3 mL Intravenous Q12H   Continuous Infusions: . sodium chloride     PRN Meds: sodium chloride, acetaminophen, atropine, ondansetron (ZOFRAN) IV,  sodium chloride flush  Allergies:   No Known Allergies  Social History:   Social History   Socioeconomic History  . Marital status: Single    Spouse name: Not on file  . Number of children: Not on file  . Years of education: Not on file  . Highest education level: Not on file  Occupational History  . Not on file  Social Needs  . Financial resource strain: Not on file  . Food insecurity:    Worry: Not on file    Inability: Not  on file  . Transportation needs:    Medical: Not on file    Non-medical: Not on file  Tobacco Use  . Smoking status: Never Smoker  . Smokeless tobacco: Never Used  Substance and Sexual Activity  . Alcohol use: Yes    Comment: occassionally  . Drug use: No  . Sexual activity: Not on file  Lifestyle  . Physical activity:    Days per week: Not on file    Minutes per session: Not on file  . Stress: Not on file  Relationships  . Social connections:    Talks on phone: Not on file    Gets together: Not on file    Attends religious service: Not on file    Active member of club or organization: Not on file    Attends meetings of clubs or organizations: Not on file    Relationship status: Not on file  . Intimate partner violence:    Fear of current or ex partner: Not on file    Emotionally abused: Not on file    Physically abused: Not on file    Forced sexual activity: Not on file  Other Topics Concern  . 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 Bradycardia Other        multiple family members with pacemakers  . Asthma Mother   . Cancer Maternal Grandmother   . Cancer Maternal Grandfather   . Cancer Paternal Grandmother      ROS:  Please see the history of present illness.  All other ROS reviewed and negative.     Physical Exam/Data:   Vitals:   09/08/18 0600 09/08/18 0609 09/08/18 0610 09/08/18 0611  BP: 123/78     Pulse: (!) 33 (!) 30 (!) 26 (!) 29  Resp: 13 (!) 21 17 19   Temp:      TempSrc:      SpO2: 97% 94% 94% 94%  Weight:      Height:        Intake/Output Summary (Last 24 hours) at 09/08/2018 0804 Last data filed at 09/08/2018 0751 Gross per 24 hour  Intake -  Output 300 ml  Net -300 ml   Last 3 Weights 09/08/2018 09/07/2018 09/07/2018  Weight (lbs) 248 lb 14.4 oz 248 lb 14.4 oz 240 lb  Weight (kg) 112.9 kg  112.9 kg 108.863 kg     Body mass index is 28.04 kg/m.  General:  Well nourished, well developed, in no acute distress HEENT: normal Lymph: no adenopathy Neck: no JVD Endocrine:  No thryomegaly Vascular: No carotid bruits Cardiac:  irregular/bradycardic; no murmurs, gallops or rubs Lungs:  CTA b/l, no wheezing, rhonchi or rales  Abd: soft, nontender  Ext: no edema Musculoskeletal:  No deformities Skin: warm and dry  Neuro:  No gross focal abnormalities noted Psych:  Normal affect   EKG:  The EKG was personally reviewed and demonstrates:    AFlutter/CHB, IVCD, LAD, 34bpm  04/16/18: AFLutter, CHB 37bpm  Telemetry:  Telemetry was personally reviewed and demonstrates:   AFlutter, rates generally about 30, dips to 20s,  has PVCs  Relevant CV Studies:  07/02/18: PET FINAL COMMENTS  There is a snmall are in the basal segment of the inferior with mildly  decreased perfusion. On the metabolic study there abnormal  FDG upteke consistent with inflamatory process of the myocardium. More  especificaly involving the basal-inferior, Basal infero  septal and mid infero-septal walls. In the porper clinical settings this  may represent active sarcoidosis in the myocardium. There  is no adenopathy in the chest. No pulmonary inflammation identified.    06/02/18: c.MRI IMPRESSION: 1. Moderately dilated left ventricle with normal wall thickness and mildly to moderately decreased systolic function (LVEF = 44%). There is diffuse hypokinesis. There is patchy midwall late gadolinium enhancement in the basal anteroseptal, inferoseptal, inferolateral, anterolateral and apical lateral walls. 2. Mildly dilated right ventricle with normal thickness and systolic function (LVEF = 62%). There are no regional wall motion abnormalities. 3. Severe bi-atrial dilatation. 4. Mild pulmonary artery dilatation measuring 33 mm suggestive of pulmonary hypertension. 5. Trivial pericardial effusion.  Based  on late gadolinium enhancement pattern that is not in a coronary distribution these findings are suspicious for cardiac sarcoidosis. FDG PET is recommended for further evaluation. I would also recommend a cardiac CTA for evaluation of a possible ASD (vs aneurysmal interatrial septum).  04/24/18: TTE Study Conclusions - Left ventricle: The cavity size was mildly dilated. Systolic   function was mildly to moderately reduced. The estimated ejection   fraction was in the range of 40% to 45%. Mild diffuse hypokinesis   with no identifiable regional variations. - Left atrium: The atrium was severely dilated. - Right ventricle: The cavity size was mildly dilated. - Right atrium: The atrium was severely dilated. Impressions - Compared to May 2018, there is little change. Both atria appear   markedly dilated.  11/15/16: TTE Impressions: - The patient was markedly bradycardic. Mildly dilated LV with EF   40-45%, diffuse hypokinesis. Normal RV size and systolic   function. Biatrial enlargement. No significant valvular   abnormalities.  02/01/14: LVEF 50-55%  Laboratory Data:  Chemistry Recent Labs  Lab 09/07/18 1950 09/08/18 0312  NA 140 139  K 4.2 3.6  CL 106 104  CO2 26 25  GLUCOSE 93 137*  BUN 21* 19  CREATININE 1.27* 1.09  CALCIUM 9.5 9.1  GFRNONAA >60 >60  GFRAA >60 >60  ANIONGAP 8 10    Recent Labs  Lab 09/07/18 1950  PROT 6.8  ALBUMIN 4.3  AST 30  ALT 39  ALKPHOS 98  BILITOT 1.5*   Hematology Recent Labs  Lab 09/07/18 1950 09/08/18 0312  WBC 5.3 4.8  RBC 5.24 4.95  HGB 15.5 14.3  HCT 47.8 44.4  MCV 91.2 89.7  MCH 29.6 28.9  MCHC 32.4 32.2  RDW 12.8 12.7  PLT 113* 103*   Cardiac EnzymesNo results for input(s): TROPONINI in the last 168 hours.  Recent Labs  Lab 09/07/18 1955  TROPIPOC 0.05    BNPNo results for input(s): BNP, PROBNP in the last 168 hours.  DDimer No results for input(s): DDIMER in the last 168 hours.  Radiology/Studies:   Dg  Chest Port 1 View Result Date: 09/07/2018 CLINICAL DATA:  Chest pain, shortness of  breath EXAM: PORTABLE CHEST 1 VIEW COMPARISON:  Chest CT 07/07/2018 FINDINGS: Cardiomegaly. No confluent opacities, effusions or edema. No acute bony abnormality. IMPRESSION: Cardiomegaly.  No active disease. Electronically Signed   By: Rolm Baptise M.D.   On: 09/07/2018 20:17    Assessment and Plan:   1. CHB     Longstanding HR 30's per patient for 10 years      2. Working diagnosis of cardiac sarcoid     Pt mentions this is not been officially diagnosed  3. AFlutter     CHA2DS2Vasc is 1, not on a/c  4. CM     Suspect to be sarcoid     No exam findings of fluid OL  + family hx of LMNA gener mutation in a sibling and a 1st cousin (this person having had a heart transplant)   Will keep him NPO for possible device implant He will pace all of the time, he does not have LBBB, though anticipate CRT ICD The patient looks forward to discussing with Dr. Lovena Le, and hopes to proceed today  For questions or updates, please contact [facility] Please consult [email or web address] for contact info under     Signed, Baldwin Jamaica, PA-C  09/08/2018 8:04 AM  EP Attending  Patient seen and examined. Agree with above. He has CHB, Sarcoidosis, EF 40%, and CHF. He presents today has he has had progressive sob and HR of 30/min. I have discussed the indications/risks/benefits/goals/expectations of Biv I(CD insertion and he wishes to proceed.

## 2018-09-08 NOTE — Progress Notes (Signed)
Cardiac monitor alarmed several times tonight due to heart rate was 25-35, BP remained stable. Pt was asymptomatically sleeping well, but easily to arouse and awake. Pt refused to get Atropine at this moment and he mentioned that his heart rate is always low without any symptom at night. Will continue to monitor.  Kennyth Lose, BSN,RN,PCCN-CMC

## 2018-09-08 NOTE — Consult Note (Addendum)
Cardiology Consultation:   Patient ID: Ricardo Jones MRN: [id number]; DOB: 1971/04/29  Admit date: 09/07/2018 Date of Consult: 09/08/2018  Primary Care Provider: Patient, No Pcp Per Primary Cardiologist: Mertie Moores, MD  Primary Electrophysiologist:  Dr.Klein   Patient Profile:   Ricardo Jones is a 48 y.o. 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 Neena Rhymes.  History of Present Illness:   Ricardo Jones was seen by Dr. Caryl Comes last month for evaluation of his Flutter and CHB, as well as declining EF, looks like there was extensive discussion in regards to prior evaluation and recommendation from [facility] clinic, the patient did not want to pursue biopsy diagnosis, followed by with prednisone, folate and methotrexate (even if biopsy negative).  He has had PET imaging at [facility] 07/02/18: FINAL COMMENTS  There is a snmall are in the basal segment of the inferior with mildly  decreased perfusion. On the metabolic study there abnormal  FDG upteke consistent with inflamatory process of the myocardium. More  especificaly involving the basal-inferior, Basal infero  septal and mid infero-septal walls. In the porper clinical settings this  may represent active sarcoidosis in the myocardium. There  is no adenopathy in the chest. No pulmonary inflammation identified.  It seems recommended device implant, seems patient initially reluctant, though via phone notes, he was eventually planned for ICD implant, scheduled 09/22/2018.  He was admitted to [facility] with progressive weakness, fatigue.   LABS K+ 4.2 > 3.6 BUN/Creat 19/1.09 Mag 1.9 WBC 4.8 H/H 14/44 Plts 103  04/16/18 TSH 1.580  The patient reports that he has had HR 30's for 10 years, were limiting him or symptomatic with this.  And even of late with his recent work-ups, has been going to the gym regularly without changes to his exertional  capacity.  In the last week (perhaps 2), he has noted fluctuation in his HR, feeling it speed up then drop, these sudden drops are what he feels mostly, with a tingling sensation throughout his body and a lightheaded weak feeling.  He has not had syncope.  He wonders if since he has made the decision to proceed with implant that his mind isn't getting away from him/contributing to  His symptoms, but with increasing symptoms he decided to come in worried his HR/conduction may be worsening.  He would like to proceed with implant this stay. He has not had CP or SOB.  Has noted some fleeting L arm pain at times, these are fleeting, random, not positional or exertional  The patient was informed yesterday of family history: A 1st cousin who has had a heart transplant for DCM, tested + for LMNA gene mutation felt to be the etiology His cousin's sister has the mutation, but no cardiac issue, another sibling does not carry the mutation The patient or his family has not been tested  Past Medical History:  Diagnosis Date  . Bradycardia    asymptomatic  . S/P dilatation of esophageal stricture   . Typical atrial flutter [facility])     Past Surgical History:  Procedure Laterality Date  . THROAT SURGERY  2012     Home Medications:  Prior to Admission medications   Not on File    Inpatient Medications: Scheduled Meds: . sodium chloride flush  3 mL Intravenous Q12H   Continuous Infusions: . sodium chloride     PRN Meds: sodium chloride, acetaminophen,  atropine, ondansetron (ZOFRAN) IV, sodium chloride flush  Allergies:   No Known Allergies  Social History:   Social History   Socioeconomic History  . Marital status: Single    Spouse name: Not on file  . Number of children: Not on file  . Years of education: Not on file  . Highest education level: Not on file  Occupational History  . Not on file  Social Needs  . Financial resource strain: Not on file  . Food insecurity:    Worry: Not on  file    Inability: Not on file  . Transportation needs:    Medical: Not on file    Non-medical: Not on file  Tobacco Use  . Smoking status: Never Smoker  . Smokeless tobacco: Never Used  Substance and Sexual Activity  . Alcohol use: Yes    Comment: occassionally  . Drug use: No  . Sexual activity: Not on file  Lifestyle  . Physical activity:    Days per week: Not on file    Minutes per session: Not on file  . Stress: Not on file  Relationships  . Social connections:    Talks on phone: Not on file    Gets together: Not on file    Attends religious service: Not on file    Active member of club or organization: Not on file    Attends meetings of clubs or organizations: Not on file    Relationship status: Not on file  . Intimate partner violence:    Fear of current or ex partner: Not on file    Emotionally abused: Not on file    Physically abused: Not on file    Forced sexual activity: Not on file  Other Topics Concern  . 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 Bradycardia Other        multiple family members with pacemakers  . Asthma Mother   . Cancer Maternal Grandmother   . Cancer Maternal Grandfather   . Cancer Paternal Grandmother      ROS:  Please see the history of present illness.  All other ROS reviewed and negative.     Physical Exam/Data:   Vitals:   09/08/18 0600 09/08/18 0609 09/08/18 0610 09/08/18 0611  BP: 123/78     Pulse: (!) 33 (!) 30 (!) 26 (!) 29  Resp: 13 (!) 21 17 19   Temp:      TempSrc:      SpO2: 97% 94% 94% 94%  Weight:      Height:        Intake/Output Summary (Last 24 hours) at 09/08/2018 0804 Last data filed at 09/08/2018 0751 Gross per 24 hour  Intake -  Output 300 ml  Net -300 ml   Last 3 Weights 09/08/2018 09/07/2018 09/07/2018  Weight (lbs) 248 lb 14.4 oz 248 lb 14.4 oz 240 lb    Weight (kg) 112.9 kg 112.9 kg 108.863 kg     Body mass index is 28.04 kg/m.  General:  Well nourished, well developed, in no acute distress HEENT: normal Lymph: no adenopathy Neck: no JVD Endocrine:  No thryomegaly Vascular: No carotid bruits Cardiac:  irregular/bradycardic; no murmurs, gallops or rubs Lungs:  CTA b/l, no wheezing, rhonchi or rales  Abd: soft, nontender  Ext: no edema Musculoskeletal:  No deformities Skin:  warm and dry  Neuro:  No gross focal abnormalities noted Psych:  Normal affect   EKG:  The EKG was personally reviewed and demonstrates:    AFlutter/CHB, IVCD, LAD, 34bpm  04/16/18: AFLutter, CHB 37bpm  Telemetry:  Telemetry was personally reviewed and demonstrates:   AFlutter, rates generally about 30, dips to 20s,  has PVCs  Relevant CV Studies:  07/02/18: PET FINAL COMMENTS  There is a snmall are in the basal segment of the inferior with mildly  decreased perfusion. On the metabolic study there abnormal  FDG upteke consistent with inflamatory process of the myocardium. More  especificaly involving the basal-inferior, Basal infero  septal and mid infero-septal walls. In the porper clinical settings this  may represent active sarcoidosis in the myocardium. There  is no adenopathy in the chest. No pulmonary inflammation identified.    06/02/18: c.MRI IMPRESSION: 1. Moderately dilated left ventricle with normal wall thickness and mildly to moderately decreased systolic function (LVEF = 44%). There is diffuse hypokinesis. There is patchy midwall late gadolinium enhancement in the basal anteroseptal, inferoseptal, inferolateral, anterolateral and apical lateral walls. 2. Mildly dilated right ventricle with normal thickness and systolic function (LVEF = 62%). There are no regional wall motion abnormalities. 3. Severe bi-atrial dilatation. 4. Mild pulmonary artery dilatation measuring 33 mm suggestive of pulmonary hypertension. 5. Trivial  pericardial effusion.  Based on late gadolinium enhancement pattern that is not in a coronary distribution these findings are suspicious for cardiac sarcoidosis. FDG PET is recommended for further evaluation. I would also recommend a cardiac CTA for evaluation of a possible ASD (vs aneurysmal interatrial septum).  04/24/18: TTE Study Conclusions - Left ventricle: The cavity size was mildly dilated. Systolic   function was mildly to moderately reduced. The estimated ejection   fraction was in the range of 40% to 45%. Mild diffuse hypokinesis   with no identifiable regional variations. - Left atrium: The atrium was severely dilated. - Right ventricle: The cavity size was mildly dilated. - Right atrium: The atrium was severely dilated. Impressions - Compared to May 2018, there is little change. Both atria appear   markedly dilated.  11/15/16: TTE Impressions: - The patient was markedly bradycardic. Mildly dilated LV with EF   40-45%, diffuse hypokinesis. Normal RV size and systolic   function. Biatrial enlargement. No significant valvular   abnormalities.  02/01/14: LVEF 50-55%  Laboratory Data:  Chemistry Recent Labs  Lab 09/07/18 1950 09/08/18 0312  NA 140 139  K 4.2 3.6  CL 106 104  CO2 26 25  GLUCOSE 93 137*  BUN 21* 19  CREATININE 1.27* 1.09  CALCIUM 9.5 9.1  GFRNONAA >60 >60  GFRAA >60 >60  ANIONGAP 8 10    Recent Labs  Lab 09/07/18 1950  PROT 6.8  ALBUMIN 4.3  AST 30  ALT 39  ALKPHOS 98  BILITOT 1.5*   Hematology Recent Labs  Lab 09/07/18 1950 09/08/18 0312  WBC 5.3 4.8  RBC 5.24 4.95  HGB 15.5 14.3  HCT 47.8 44.4  MCV 91.2 89.7  MCH 29.6 28.9  MCHC 32.4 32.2  RDW 12.8 12.7  PLT 113* 103*   Cardiac EnzymesNo results for input(s): TROPONINI in the last 168 hours.  Recent Labs  Lab 09/07/18 1955  TROPIPOC 0.05    BNPNo results for input(s): BNP, PROBNP in the last 168 hours.  DDimer No results for input(s): DDIMER in the last 168  hours.  Radiology/Studies:   Dg Chest Port 1 View Result Date: 09/07/2018 CLINICAL  DATA:  Chest pain, shortness of breath EXAM: PORTABLE CHEST 1 VIEW COMPARISON:  Chest CT 07/07/2018 FINDINGS: Cardiomegaly. No confluent opacities, effusions or edema. No acute bony abnormality. IMPRESSION: Cardiomegaly.  No active disease. Electronically Signed   By: Rolm Baptise M.D.   On: 09/07/2018 20:17    Assessment and Plan:   1. CHB     Longstanding HR 30's per patient for 10 years      2. Working diagnosis of cardiac sarcoid     Pt mentions this is not been officially diagnosed  3. AFlutter     CHA2DS2Vasc is 1, not on a/c  4. CM     Suspect to be sarcoid     No exam findings of fluid OL  + family hx of LMNA gener mutation in a sibling and a 1st cousin (this person having had a heart transplant)   Will keep him NPO for possible device implant He will pace all of the time, he does not have LBBB, though anticipate CRT ICD The patient looks forward to discussing with Dr. Lovena Le, and hopes to proceed today  For questions or updates, please contact [facility] Please consult [email or web address] for contact info under     Signed, Baldwin Jamaica, PA-C  09/08/2018 8:04 AM  EP Attending  Patient seen and examined. Agree with above. He has CHB, Sarcoidosis, EF 40%, and CHF. He presents today has he has had progressive sob and HR of 30/min. I have discussed the indications/risks/benefits/goals/expectations of Biv I(CD insertion and he wishes to proceed.  AMMENDED to correct family hx of LMNA gene mutation Tommye Standard, [place]  EP Attending  Patient seen and examined. Agree with the above modification.   Mikle Bosworth.D.

## 2018-09-08 NOTE — Plan of Care (Signed)
  Problem: Education: Goal: Knowledge of General Education information will improve Description Including pain rating scale, medication(s)/side effects and non-pharmacologic comfort measures Outcome: Progressing   Problem: Education: Goal: Knowledge of cardiac device and self-care will improve Outcome: Progressing

## 2018-09-08 NOTE — Interval H&P Note (Signed)
History and Physical Interval Note:  09/08/2018 5:09 PM  Ricardo Jones  has presented today for surgery, with the diagnosis of Complete Heart Block  The various methods of treatment have been discussed with the patient and family. After consideration of risks, benefits and other options for treatment, the patient has consented to  Procedure(s): BIV ICD INSERTION CRT-D (N/A) as a surgical intervention .  The patient's history has been reviewed, patient examined, no change in status, stable for surgery.  I have reviewed the patient's chart and labs.  Questions were answered to the patient's satisfaction.     Cristopher Peru

## 2018-09-08 NOTE — Discharge Instructions (Signed)
° ° °  Supplemental Discharge Instructions for  Pacemaker/Defibrillator Patients  Activity No heavy lifting or vigorous activity with your left/right arm for 6 to 8 weeks.  Do not raise your left/right arm above your head for one week.  Gradually raise your affected arm as drawn below.             09/12/2018                 09/13/2018                09/14/2018                 09/15/2018 __  NO DRIVING for  1 week   ; you may begin driving on   0/03/3234  .  WOUND CARE - Keep the wound area clean and dry.  Do not get this area wet for one week. No showers for one week; you may shower on  09/15/2018   . - The tape/steri-strips on your wound will fall off; do not pull them off.  No bandage is needed on the site.  DO  NOT apply any creams, oils, or ointments to the wound area. - If you notice any drainage or discharge from the wound, any swelling or bruising at the site, or you develop a fever > 101? F after you are discharged home, call the office at once.  Special Instructions - You are still able to use cellular telephones; use the ear opposite the side where you have your pacemaker/defibrillator.  Avoid carrying your cellular phone near your device. - When traveling through airports, show security personnel your identification card to avoid being screened in the metal detectors.  Ask the security personnel to use the hand wand. - Avoid arc welding equipment, MRI testing (magnetic resonance imaging), TENS units (transcutaneous nerve stimulators).  Call the office for questions about other devices. - Avoid electrical appliances that are in poor condition or are not properly grounded. - Microwave ovens are safe to be near or to operate.  Additional information for defibrillator patients should your device go off: - If your device goes off ONCE and you feel fine afterward, notify the device clinic nurses. - If your device goes off ONCE and you do not feel well afterward, call 911. - If your device goes  off TWICE, call 911. - If your device goes off THREE times in one day, call 911.  DO NOT DRIVE YOURSELF OR A FAMILY MEMBER WITH A DEFIBRILLATOR TO THE HOSPITAL--CALL 911.

## 2018-09-09 ENCOUNTER — Inpatient Hospital Stay (HOSPITAL_COMMUNITY): Payer: BLUE CROSS/BLUE SHIELD

## 2018-09-09 ENCOUNTER — Encounter (HOSPITAL_COMMUNITY): Payer: Self-pay | Admitting: Internal Medicine

## 2018-09-09 LAB — BASIC METABOLIC PANEL
ANION GAP: 7 (ref 5–15)
BUN: 16 mg/dL (ref 6–20)
CO2: 25 mmol/L (ref 22–32)
Calcium: 9.1 mg/dL (ref 8.9–10.3)
Chloride: 105 mmol/L (ref 98–111)
Creatinine, Ser: 1.06 mg/dL (ref 0.61–1.24)
GFR calc Af Amer: >60 mL/min (ref >60)
GFR calc non Af Amer: >60 mL/min (ref >60)
Glucose, Bld: 96 mg/dL (ref 70–99)
Potassium: 4.4 mmol/L (ref 3.5–5.1)
Sodium: 137 mmol/L (ref 135–145)

## 2018-09-09 MED ORDER — CARVEDILOL 6.25 MG PO TABS
6.2500 mg | ORAL_TABLET | Freq: Two times a day (BID) | ORAL | Status: DC
  Filled 2018-09-09: qty 1

## 2018-09-09 MED ORDER — LOSARTAN POTASSIUM 25 MG PO TABS: 25.0000 mg | ORAL_TABLET | Freq: Every day | ORAL | Status: DC

## 2018-09-09 MED ORDER — OXYCODONE HCL 5 MG PO TABS: 5.0000 mg | ORAL_TABLET | Freq: Four times a day (QID) | ORAL | Status: DC | PRN

## 2018-09-09 NOTE — Care Management Note (Signed)
Case Management Note Marvetta Gibbons RN, BSN Transitions of Care Unit 4E- RN Case Manager [phone]  Patient Details  Name: Ricardo Jones MRN: [id number] Date of Birth: Nov 17, 1970  Subjective/Objective:  Pt admitted with complete heart block s/p PPM/ICD                  Action/Plan: PTA pt lived at home, plan to return home, No CM needs noted for transition home.   Expected Discharge Date:  09/09/18               Expected Discharge Plan:  Home/Self Care  In-House Referral:     Discharge planning Services  CM Consult  Post Acute Care Choice:  NA Choice offered to:  NA  DME Arranged:    DME Agency:     HH Arranged:    HH Agency:     Status of Service:  Completed, signed off  If discussed at [place] of Stay Meetings, dates discussed:    Discharge Disposition: home/self care   Additional Comments:  Dawayne Patricia, RN 09/09/2018, 2:55 PM

## 2018-09-09 NOTE — Progress Notes (Signed)
Patient c/o 2/10 midsternal discomfort MD notified pain medicine ordered. Patient defered pain medicine due to pain having subsided. Will continue to monitor

## 2018-09-09 NOTE — Progress Notes (Signed)
IV and telemetry discontinued. CCMD notified. Discharge instructions reviewed with patient. All questions answered.   K. Starr Alberta Lenhard, RN 

## 2018-09-09 NOTE — Progress Notes (Signed)
Patient refused carvedilol and losartan this am. Patient states that he wants to research medications prior to starting them.   Emelda Fear, RN

## 2018-09-18 ENCOUNTER — Ambulatory Visit (INDEPENDENT_AMBULATORY_CARE_PROVIDER_SITE_OTHER): Payer: BLUE CROSS/BLUE SHIELD | Admitting: Nurse Practitioner

## 2018-09-18 DIAGNOSIS — I5022 Chronic systolic (congestive) heart failure: Secondary | ICD-10-CM | POA: Diagnosis not present

## 2018-09-18 DIAGNOSIS — I484 Atypical atrial flutter: Secondary | ICD-10-CM

## 2018-09-18 LAB — CUP PACEART INCLINIC DEVICE CHECK
Date Time Interrogation Session: 20200305082531
Implantable Lead Implant Date: 20200224
Implantable Lead Implant Date: 20200224
Implantable Lead Implant Date: 20200224
Implantable Lead Location: 753859
Implantable Lead Location: 753860
Implantable Lead Model: 5076
Implantable Pulse Generator Implant Date: 20200224
MDC IDC LEAD LOCATION: 753858

## 2018-09-18 NOTE — Progress Notes (Signed)
Wound check appointment. Steri-strips removed. Wound without redness or edema. Incision edges approximated, wound well healed. Normal device function. Thresholds, sensing, and impedances consistent with implant measurements. Device programmed at 3.5V for extra safety margin until 3 month visit. Histogram distribution appropriate for patient and level of activity. No ventricular arrhythmias noted. Pt in underlying atrial flutter, plan flutter ablation post device implant per notes. Not currently on [place], CHASD2VASC is 0.  Patient educated about wound care, arm mobility, lifting restrictions, shock plan. ROV in 3 months with implanting physician. Pt with pruritic rash from left axillae to left chest and around device site. Question local reaction to scrub. I have advised Benadryl as well as hydrocortisone cream (avoiding incision line).  If not improved by Monday, I have asked him to call back. May need a short course of steroids.

## 2018-09-22 ENCOUNTER — Encounter (HOSPITAL_COMMUNITY): Payer: Self-pay

## 2018-09-22 ENCOUNTER — Ambulatory Visit (HOSPITAL_COMMUNITY): Admit: 2018-09-22 | Payer: BLUE CROSS/BLUE SHIELD | Admitting: Internal Medicine

## 2018-09-22 SURGERY — BIV ICD INSERTION CRT-D
Anesthesia: LOCAL

## 2018-09-29 ENCOUNTER — Ambulatory Visit: Payer: BLUE CROSS/BLUE SHIELD

## 2018-10-06 ENCOUNTER — Telehealth: Payer: Self-pay

## 2018-10-06 NOTE — Telephone Encounter (Signed)
Called pt to offer a telehealth visit with him this week. He quickly declined the offer. He states he feels fine and he has no interest in a telehealth visit. He denies any cardiac symptoms at this time and agrees to postpone his appt. Dr Caryl Comes was informed as well.

## 2018-10-08 ENCOUNTER — Encounter: Payer: BLUE CROSS/BLUE SHIELD | Admitting: Internal Medicine

## 2018-11-21 ENCOUNTER — Telehealth: Payer: Self-pay | Admitting: Internal Medicine

## 2018-11-21 NOTE — Telephone Encounter (Signed)
Spoke with Dr. Caryl Comes. Dr. Caryl Comes will review patient's chart and plan to call him on Monday. Will make patient aware that we are in-office on Wednesday next week if Dr. Caryl Comes recommends any changes to his device programming.  Spoke with patient. He is agreeable to plan and is aware that we will fit him in on Wednesday if Dr. Caryl Comes recommends reprogramming.

## 2018-11-21 NOTE — Telephone Encounter (Signed)
Transmission reviewed. Presenting rhythm shows A-flutter/BiVp @ 85bpm w/quadrigeminal PVCs. Programmed VVIR with base rate at 70bpm, upper sensor rate 120bpm. Persistent A-flutter since at least 10/2018, 47.1% AT/AF burden since wound check on 09/18/18. BiV pacing 84.4% (effective 84.2%). Lead trends stable.  Spoke with patient and wife. Advised histograms look reasonable, but asked him to elaborate on his symptoms. Pt reports he is frustrated as he cannot do the things he wants to do. When he is exercising (riding his bike), walking up hills, moving furniture, he gets very short of breath and has to stop to catch his breath. He feels like his HR should be higher earlier in his exercise session. He reports he felt better prior to device implant. He declines a virtual visit, reports that it will not provide the care he needs as he feels he needs device reprogramming, not a discussion. Advised I will send this message to Dr. Caryl Comes and call him back with recommendations. Pt is agreeable to this plan.  See histograms and current sensor programming below:

## 2018-11-21 NOTE — Telephone Encounter (Signed)
Spoke w/ pt wife and requested her to have pt send a manual transmission w/ the home monitor. Gave her instructions on how to do this. She stated that they would send transmission.

## 2018-11-21 NOTE — Telephone Encounter (Signed)
New message   Per patient's wife the patient needs an adjustment to device, she states that the heart rate needs to go higher quicker. Please call.

## 2018-11-27 ENCOUNTER — Encounter: Payer: Self-pay | Admitting: Internal Medicine

## 2018-11-27 NOTE — Telephone Encounter (Signed)
Dr. Caryl Comes attempted to reach patient earlier this week and LM, plans to try again before the end of the week.

## 2018-11-27 NOTE — Progress Notes (Unsigned)
Again tried to reach out to Ricardo Jones,  [place]

## 2018-11-28 ENCOUNTER — Encounter: Payer: Self-pay | Admitting: Internal Medicine

## 2018-11-28 NOTE — Progress Notes (Signed)
Have tried for the 3rd time this week to reach by phone to discuss symptoms

## 2018-12-05 ENCOUNTER — Encounter: Payer: Self-pay | Admitting: Podiatry

## 2018-12-05 ENCOUNTER — Other Ambulatory Visit: Payer: Self-pay

## 2018-12-05 ENCOUNTER — Ambulatory Visit (INDEPENDENT_AMBULATORY_CARE_PROVIDER_SITE_OTHER): Payer: BLUE CROSS/BLUE SHIELD | Admitting: Podiatry

## 2018-12-05 VITALS — Temp 97.7°F

## 2018-12-05 DIAGNOSIS — B353 Tinea pedis: Secondary | ICD-10-CM | POA: Diagnosis not present

## 2018-12-05 DIAGNOSIS — B351 Tinea unguium: Secondary | ICD-10-CM | POA: Diagnosis not present

## 2018-12-05 MED ORDER — CLOTRIMAZOLE-BETAMETHASONE 1-0.05 % EX CREA: 1.0000 "application " | TOPICAL_CREAM | Freq: Two times a day (BID) | CUTANEOUS | 0 refills | Status: DC

## 2018-12-09 ENCOUNTER — Ambulatory Visit (INDEPENDENT_AMBULATORY_CARE_PROVIDER_SITE_OTHER): Payer: BLUE CROSS/BLUE SHIELD | Admitting: *Deleted

## 2018-12-09 ENCOUNTER — Other Ambulatory Visit: Payer: Self-pay

## 2018-12-09 DIAGNOSIS — I5022 Chronic systolic (congestive) heart failure: Secondary | ICD-10-CM | POA: Diagnosis not present

## 2018-12-09 LAB — CUP PACEART REMOTE DEVICE CHECK
Battery Remaining Longevity: 81 mo
Battery Voltage: 3.04 V
Brady Statistic AP VP Percent: 0 %
Brady Statistic AP VS Percent: 0 %
Brady Statistic AS VP Percent: 81.53 %
Brady Statistic AS VS Percent: 18.47 %
Brady Statistic RA Percent Paced: 0 %
Brady Statistic RV Percent Paced: 84.21 %
Date Time Interrogation Session: 20200526083724
HighPow Impedance: 73 Ohm
Implantable Lead Implant Date: 20200224
Implantable Lead Implant Date: 20200224
Implantable Lead Implant Date: 20200224
Implantable Lead Location: 753858
Implantable Lead Location: 753859
Implantable Lead Location: 753860
Implantable Lead Model: 5076
Implantable Lead Model: 6935
Implantable Pulse Generator Implant Date: 20200224
Lead Channel Impedance Value: 1083 Ohm
Lead Channel Impedance Value: 231.878
Lead Channel Impedance Value: 250.87 Ohm
Lead Channel Impedance Value: 268.667
Lead Channel Impedance Value: 283.443
Lead Channel Impedance Value: 304 Ohm
Lead Channel Impedance Value: 312.348
Lead Channel Impedance Value: 399 Ohm
Lead Channel Impedance Value: 437 Ohm
Lead Channel Impedance Value: 494 Ohm
Lead Channel Impedance Value: 513 Ohm
Lead Channel Impedance Value: 589 Ohm
Lead Channel Impedance Value: 665 Ohm
Lead Channel Impedance Value: 817 Ohm
Lead Channel Impedance Value: 855 Ohm
Lead Channel Impedance Value: 893 Ohm
Lead Channel Impedance Value: 912 Ohm
Lead Channel Impedance Value: 969 Ohm
Lead Channel Pacing Threshold Amplitude: 0.625 V
Lead Channel Pacing Threshold Pulse Width: 0.4 ms
Lead Channel Sensing Intrinsic Amplitude: 12.5 mV
Lead Channel Sensing Intrinsic Amplitude: 12.5 mV
Lead Channel Sensing Intrinsic Amplitude: 2.625 mV
Lead Channel Sensing Intrinsic Amplitude: 2.625 mV
Lead Channel Setting Pacing Amplitude: 2.5 V
Lead Channel Setting Pacing Amplitude: 3.25 V
Lead Channel Setting Pacing Pulse Width: 0.4 ms
Lead Channel Setting Pacing Pulse Width: 0.6 ms
Lead Channel Setting Sensing Sensitivity: 0.3 mV

## 2018-12-10 NOTE — Progress Notes (Signed)
Subjective: 48 year old male presents the office today for evaluation of nail fungus as well as athletes feet.  He is on numerous medications as well as laser therapy without any significant long-term improvement.  The last cream that I ordered for him did help for some time but it came right back.  He denies any open sores or any drainage. He also has a rash on the left ankle that has been spreading.  It does itch. Denies any systemic complaints such as fevers, chills, nausea, vomiting. No acute changes since last appointment, and no other complaints at this time.   Objective: AAO x3, NAD DP/PT pulses palpable bilaterally, CRT less than 3 seconds  Nails are hypertrophic, dystrophic with yellow-brown discoloration.  There is no pain in the nails there is no skin redness or drainage.  There is erythematous, thickened skin the plantar aspect of foot as well interdigitally consistent with tinea pedis.  Erythematous rash going on the lateral aspect of the ankle.  No open sores or any drainage.  No skin fissures. No open lesions or pre-ulcerative lesions.  No pain with calf compression, swelling, warmth, erythema  Assessment: Onychomycosis, tinea pedis  Plan: -All treatment options discussed with the patient including all alternatives, risks, complications.  -He has time oral medication as well as laser treatment and topical therapy significant provement.  Today I dispensed 1 month refill nails as well as another topical antifungal cream from the office that he has no skin or nails.  Also prescribed Lotrisone cream.  If not better dermatology evaluation -Due to cardiac issues holding off on other oral antifungals. -Patient encouraged to call the office with any questions, concerns, change in symptoms.   Trula Slade DPM

## 2018-12-15 ENCOUNTER — Other Ambulatory Visit: Payer: Self-pay

## 2018-12-15 ENCOUNTER — Ambulatory Visit (INDEPENDENT_AMBULATORY_CARE_PROVIDER_SITE_OTHER): Payer: BLUE CROSS/BLUE SHIELD | Admitting: Nurse Practitioner

## 2018-12-15 ENCOUNTER — Encounter: Payer: Self-pay | Admitting: Nurse Practitioner

## 2018-12-15 ENCOUNTER — Encounter: Payer: BLUE CROSS/BLUE SHIELD | Admitting: Internal Medicine

## 2018-12-15 VITALS — BP 110/74 | HR 76 | Ht 79.0 in | Wt 250.4 lb

## 2018-12-15 DIAGNOSIS — I4892 Unspecified atrial flutter: Secondary | ICD-10-CM | POA: Diagnosis not present

## 2018-12-15 DIAGNOSIS — I5022 Chronic systolic (congestive) heart failure: Secondary | ICD-10-CM

## 2018-12-15 DIAGNOSIS — I442 Atrioventricular block, complete: Secondary | ICD-10-CM | POA: Diagnosis not present

## 2018-12-15 MED ORDER — FUROSEMIDE 20 MG PO TABS: ORAL_TABLET | ORAL | 3 refills | Status: DC

## 2018-12-15 MED ORDER — POTASSIUM CHLORIDE ER 10 MEQ PO TBCR: EXTENDED_RELEASE_TABLET | ORAL | 3 refills | Status: DC

## 2018-12-15 NOTE — Patient Instructions (Signed)
Medication Instructions:  Start Lasix 20 mg Daily, if needed Start Potassium 10 meq with Lasix If you need a refill on your cardiac medications before your next appointment, please call your pharmacy.   Lab work:TODAY BMET If you have labs (blood work) drawn today and your tests are completely normal, you will receive your results only by: Marland Kitchen MyChart Message (if you have MyChart) OR . A paper copy in the mail If you have any lab test that is abnormal or we need to change your treatment, we will call you to review the results.  Testing/Procedures: NONE  Follow-Up: 2 WEEKS with Christie, you and your health needs are our priority.  As part of our continuing mission to provide you with exceptional heart care, we have created designated Provider Care Teams.  These Care Teams include your primary Cardiologist (physician) and Advanced Practice Providers (APPs -  Physician Assistants and Nurse Practitioners) who all work together to provide you with the care you need, when you need it. .   Any Other Special Instructions Will Be Listed Below (If Applicable). Xarelto Eliquis Pradaxa

## 2018-12-15 NOTE — Progress Notes (Signed)
Electrophysiology Office Note Date: 12/15/2018  ID:  Ricardo Jones, DOB [birth year], MRN [id number]  PCP: Patient, No Pcp Per Primary Cardiologist: Nahser Electrophysiologist: Caryl Comes  CC: 3 month post CRT follow up  Ricardo Jones is a 48 y.o. male seen today for Dr Caryl Comes.  They present today for routine EP followup.  Since last being seen in our clinic, the patient reports doing reasonably well.  He is concerned with his heart rate during exercise and associated shortness of breath. He swam competitively his whole life and has always been very active (hiking, biking, etc).  His max sensor rate is 120bpm and he doesn't feel that this is adequate heart rate response to exertion. He also doesn't feel that his heart rate increases quickly enough with activity.  His weight is up several pounds from his dry weight with some associated LE edema.  He denies chest pain, palpitations, PND, orthopnea, nausea, vomiting, dizziness, syncope, or early satiety.  He has not had ICD shocks.   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 Bradycardia    asymptomatic  . S/P dilatation of esophageal stricture   . Typical atrial flutter [facility])    Past Surgical History:  Procedure Laterality Date  . BIV ICD INSERTION CRT-D N/A 09/08/2018   Procedure: BIV ICD INSERTION CRT-D;  Surgeon: Evans Lance, MD;  Location: [place] [facility];  Service: Cardiovascular;  Laterality: N/A;  . THROAT SURGERY  2012    Current Outpatient Medications  Medication Sig Dispense Refill  . clotrimazole-betamethasone (LOTRISONE) cream Apply 1 application topically 2 (two) times daily. 30 g 0  . PREVIDENT 5000 BOOSTER PLUS 1.1 % PSTE     . furosemide (LASIX) 20 MG tablet Take 1 tablet 20 mg Daily, when needed 30 tablet 3  . potassium chloride (K-DUR) 10 MEQ tablet Take 1 tablet 10 meq with Lasix 30 tablet  3   No current facility-administered medications for this visit.     Allergies:   Patient has no known allergies.   Social History: Social History   Socioeconomic History  . Marital status: Single    Spouse name: Not on file  . Number of children: Not on file  . Years of education: Not on file  . Highest education level: Not on file  Occupational History  . Not on file  Social Needs  . Financial resource strain: Not on file  . Food insecurity:    Worry: Not on file    Inability: Not on file  . Transportation needs:    Medical: Not on file    Non-medical: Not on file  Tobacco Use  . Smoking status: Never Smoker  . Smokeless tobacco: Never Used  Substance and Sexual Activity  . Alcohol use: Yes    Comment: occassionally  . Drug use: No  . Sexual activity: Not on file  Lifestyle  . Physical activity:    Days per week: Not on file    Minutes per session: Not on file  . Stress: Not on file  Relationships  . Social connections:    Talks on phone: Not on file    Gets together: Not on file    Attends religious service: Not on file    Active member of club or organization: Not on file    Attends meetings of clubs or organizations: Not on file  Relationship status: Not on file  . Intimate partner violence:    Fear of current or ex partner: Not on file    Emotionally abused: Not on file    Physically abused: Not on file    Forced sexual activity: Not on file  Other Topics Concern  . 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 Bradycardia Other        multiple family members with pacemakers  . Asthma Mother   . Cancer Maternal Grandmother   . Cancer Maternal Grandfather   . Cancer Paternal Grandmother     Review of Systems: All other systems reviewed and are otherwise negative except as noted above.   Physical Exam:  Vitals:   12/15/18 0953  BP: 110/74  Pulse: 76  SpO2: 99%  Weight: 250 lb 6.4 oz (113.6 kg)  Height: 6\' 7"  (2.007 m)     GEN- The patient is well appearing, alert and oriented x 3 today.   HEENT: normocephalic, atraumatic; sclera clear, conjunctiva pink; hearing intact; oropharynx clear; neck supple  Lungs- Clear to ausculation bilaterally, normal work of breathing.  No wheezes, rales, rhonchi Heart- Regular rate and rhythm (paced) GI- soft, non-tender, non-distended, bowel sounds present  Extremities- no clubbing, cyanosis, +trace BLE edema MS- no significant deformity or atrophy Skin- warm and dry, no rash or lesion; ICD pocket well healed Psych- euthymic mood, full affect Neuro- strength and sensation are intact  ICD interrogation- reviewed in detail today,  See PACEART report  EKG:  EKG is ordered today. The ekg ordered today shows atypical atrial flutter with CRT pacing  Recent Labs: 04/16/2018: TSH 1.580 07/21/2018: NT-Pro BNP 1,654 09/07/2018: ALT 39; Magnesium 1.9 09/08/2018: Hemoglobin 14.3; Platelets 103 09/09/2018: BUN 16; Creatinine, Ser 1.06; Potassium 4.4; Sodium 137   Wt Readings from Last 3 Encounters:  12/15/18 250 lb 6.4 oz (113.6 kg)  09/09/18 244 lb 4.3 oz (110.8 kg)  08/06/18 251 lb 9.6 oz (114.1 kg)     Other studies Reviewed: Additional studies/ records that were reviewed today include: hospital records, office notes   Assessment and Plan:  1.  Chronic systolic dysfunction Slightly volume loaded on exam today Stable on an appropriate medical regimen Normal ICD function See Pace Art report Will give Lasix 20mg  and K-Dur 4meq to take as needed for weight gain/edema. BMET today We discussed starting BB today - he declines CRT pacing 84% - 2/2 PVC's  Update echo 02/2019  2. Persistent atrial flutter Reviewed EKG's with Dr Lovena Le today He has at least 2 different atrial flutter circuits and has been out of rhythm for several months I think that our  ability to maintain SR is decreased CHADS2VASC is 1 - we discussed options today including trial cardioversion vs ablation vs nothing. At this point, we will see how he feels with programming changes and re-evaluate in 2 weeks.  I would favor a trial cardioversion prior to proceeding with ablation (especially with 2 different flutter circuits) to see if he is symptomatically improved in SR.   3.  Exercise intolerance We had a long discussion today about rate response and options for device programming We increased max sensor rate from 120->150bpm Also adjusted exercise slope from 3->5 He climbed 3 flights of stairs afterwards and had some improvement Options at follow up visits if more changes needed are  to change rate response sensor to low; change activity response to 15 seconds; further increase max sensor rate (this would require changing V blanking post VP interval and I would like to avoid if possible) He has a hike planned with his wife this afternoon and will see how the next couple of weeks go Follow up with me in 2 weeks.   Time spent today in face to face time 25 minutes.     Current medicines are reviewed at length with the patient today.   The patient does not have concerns regarding his medicines.  The following changes were made today:  Start Lasix 20mg  daily prn, K-Dur 41meq with Lasxi  Labs/ tests ordered today include: BMET Orders Placed This Encounter  Procedures  . Basic Metabolic Panel (BMET)  . EKG 12-Lead     Disposition:   Follow up with me in 2 weeks     Signed, Chanetta Marshall, NP 12/15/2018 10:56 AM  [facility] [place] [address] [phone] (office) [phone] (fax)

## 2018-12-16 LAB — BASIC METABOLIC PANEL
BUN/Creatinine Ratio: 23 — ABNORMAL HIGH (ref 9–20)
BUN: 23 mg/dL (ref 6–24)
CO2: 24 mmol/L (ref 20–29)
Calcium: 9.5 mg/dL (ref 8.7–10.2)
Chloride: 104 mmol/L (ref 96–106)
Creatinine, Ser: 1.01 mg/dL (ref 0.76–1.27)
GFR calc Af Amer: 100 mL/min/{1.73_m2} (ref >59)
GFR calc non Af Amer: 87 mL/min/{1.73_m2} (ref >59)
Glucose: 98 mg/dL (ref 65–99)
Potassium: 4.7 mmol/L (ref 3.5–5.2)
Sodium: 146 mmol/L — ABNORMAL HIGH (ref 134–144)

## 2018-12-18 NOTE — Progress Notes (Signed)
Remote ICD transmission.   

## 2018-12-29 ENCOUNTER — Encounter: Payer: Self-pay | Admitting: Nurse Practitioner

## 2018-12-29 ENCOUNTER — Ambulatory Visit (INDEPENDENT_AMBULATORY_CARE_PROVIDER_SITE_OTHER): Payer: BLUE CROSS/BLUE SHIELD | Admitting: Nurse Practitioner

## 2018-12-29 ENCOUNTER — Other Ambulatory Visit: Payer: Self-pay

## 2018-12-29 VITALS — BP 130/72 | HR 76 | Ht 79.0 in | Wt 250.8 lb

## 2018-12-29 DIAGNOSIS — I429 Cardiomyopathy, unspecified: Secondary | ICD-10-CM | POA: Diagnosis not present

## 2018-12-29 DIAGNOSIS — I425 Other restrictive cardiomyopathy: Secondary | ICD-10-CM

## 2018-12-29 LAB — CUP PACEART INCLINIC DEVICE CHECK
Date Time Interrogation Session: 20200615102642
Implantable Lead Implant Date: 20200224
Implantable Lead Implant Date: 20200224
Implantable Lead Implant Date: 20200224
Implantable Lead Location: 753858
Implantable Lead Location: 753859
Implantable Lead Location: 753860
Implantable Lead Model: 5076
Implantable Lead Model: 6935
Implantable Pulse Generator Implant Date: 20200224

## 2018-12-29 NOTE — Patient Instructions (Addendum)
Medication Instructions:  none If you need a refill on your cardiac medications before your next appointment, please call your pharmacy.   Lab work: none If you have labs (blood work) drawn today and your tests are completely normal, you will receive your results only by: Marland Kitchen MyChart Message (if you have MyChart) OR . A paper copy in the mail If you have any lab test that is abnormal or we need to change your treatment, we will call you to review the results.  Testing/Procedures:Please schedule in August with Dr Caryl Comes Appointment Your physician has requested that you have an echocardiogram. Echocardiography is a painless test that uses sound waves to create images of your heart. It provides your doctor with information about the size and shape of your heart and how well your heart's chambers and valves are working. This procedure takes approximately one hour. There are no restrictions for this procedure.    Follow-Up:Dr Caryl Comes August with Echo  Dr Acie Fredrickson In December  At [facility], you and your health needs are our priority.  As part of our continuing mission to provide you with exceptional heart care, we have created designated Provider Care Teams.  These Care Teams include your primary Cardiologist (physician) and Advanced Practice Providers (APPs -  Physician Assistants and Nurse Practitioners) who all work together to provide you with the care you need, when you need it. .   Any Other Special Instructions Will Be Listed Below (If Applicable).

## 2018-12-29 NOTE — Progress Notes (Signed)
Pt seen in clinic for follow up of reprogramming at last office visit. Is not as aware of exercise limitations since reprogramming.   Today, increased max sensor rate from 150-160bpm.  Follow up with Dr Caryl Comes in August with echo at that time.  Reviewed again that likelihood of success with ablation is decreased.  If still having exercise intolerance, would try DCCV to see if symptomatically improved in [place], NP 12/29/2018 10:14 AM

## 2019-01-02 ENCOUNTER — Encounter: Payer: BLUE CROSS/BLUE SHIELD | Admitting: Internal Medicine

## 2019-03-06 ENCOUNTER — Ambulatory Visit (INDEPENDENT_AMBULATORY_CARE_PROVIDER_SITE_OTHER): Payer: BC Managed Care – PPO | Admitting: Podiatry

## 2019-03-06 ENCOUNTER — Encounter: Payer: Self-pay | Admitting: Podiatry

## 2019-03-06 ENCOUNTER — Other Ambulatory Visit: Payer: Self-pay

## 2019-03-06 DIAGNOSIS — R21 Rash and other nonspecific skin eruption: Secondary | ICD-10-CM | POA: Diagnosis not present

## 2019-03-06 DIAGNOSIS — B353 Tinea pedis: Secondary | ICD-10-CM

## 2019-03-06 DIAGNOSIS — B351 Tinea unguium: Secondary | ICD-10-CM | POA: Diagnosis not present

## 2019-03-06 MED ORDER — TRIAMCINOLONE ACETONIDE 0.025 % EX OINT: 1.0000 "application " | TOPICAL_OINTMENT | Freq: Two times a day (BID) | CUTANEOUS | 0 refills | Status: DC

## 2019-03-06 NOTE — Progress Notes (Signed)
Subjective: Corby  presents the office today for follow-up evaluation of fungus as well as a rash to his leg.  He is been using topical not seeing much improvement.  He states that the cream does help for his feet however if he stops using it then the fungus comes right back. Denies any systemic complaints such as fevers, chills, nausea, vomiting. No acute changes since last appointment, and no other complaints at this time.   Objective: AAO x3, NAD DP/PT pulses palpable bilaterally, CRT less than 3 seconds Dry skin present to the feet as well as some evidence of tinea pedis.  Nails are dystrophic, discolored and is with onychomycosis.  There is still erythematous rash on the medial aspect the left leg.  This is what that is causing more symptoms. No open lesions or pre-ulcerative lesions.  No pain with calf compression, swelling, warmth, erythema  Assessment: 48 year old male with continue onychomycosis, skin rash   Plan: -All treatment options discussed with the patient including all alternatives, risks, complications.  -Discussed multiple treatment options.  Consider other oral antifungals including itraconazole therapy discussed with cardiology prior to this.  Also would continue with topical for now.  We also discussed dermatology evaluation.  We do not do oral medication will refer to dermatology.  Also order triamcinolone cream for the rash to his left leg. -Patient encouraged to call the office with any questions, concerns, change in symptoms.   Trula Slade DPM

## 2019-03-09 ENCOUNTER — Other Ambulatory Visit: Payer: Self-pay

## 2019-03-09 ENCOUNTER — Encounter: Payer: Self-pay | Admitting: Internal Medicine

## 2019-03-09 ENCOUNTER — Ambulatory Visit (HOSPITAL_COMMUNITY): Payer: BC Managed Care – PPO | Attending: Internal Medicine

## 2019-03-09 ENCOUNTER — Ambulatory Visit (INDEPENDENT_AMBULATORY_CARE_PROVIDER_SITE_OTHER): Payer: BC Managed Care – PPO | Admitting: Internal Medicine

## 2019-03-09 VITALS — BP 118/82 | HR 74 | Ht 79.0 in | Wt 252.0 lb

## 2019-03-09 DIAGNOSIS — D8685 Sarcoid myocarditis: Secondary | ICD-10-CM

## 2019-03-09 DIAGNOSIS — I5022 Chronic systolic (congestive) heart failure: Secondary | ICD-10-CM

## 2019-03-09 DIAGNOSIS — I429 Cardiomyopathy, unspecified: Secondary | ICD-10-CM

## 2019-03-09 DIAGNOSIS — I442 Atrioventricular block, complete: Secondary | ICD-10-CM

## 2019-03-09 NOTE — Patient Instructions (Signed)

## 2019-03-09 NOTE — Progress Notes (Signed)
Patient Care Team: Patient, No Pcp Per as PCP - General (General Practice) Jones, Ricardo Cheng, MD as PCP - Cardiology (Cardiology) Ricardo Grayer, MD as Attending Physician (Cardiology)   HPI  Ricardo Jones is a 48 y.o. 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   I spoke with cardiology [facility]-Dr. Cooper-about the scenario.  He recommended 2 courses.  The first is empiric therapy for presumed sarcoid; endpoints are clearly a challenge.  Not resolving the issue of endpoints, unclarifying diagnosis, [facility] has developed protocols for voltage guided biopsies.  I reviewed these briefly with the patient on the telephone  He also has atrial flutter with a gradually slowing cycle length  He ended up with symptomatic bradycardia with his complete heart block and received CRT -D  He ws also to undergo genetic eval for LMN mutation present in cousin and possibly mother   He had progressive exercise intolerance addressed 6/20 with reprogramming of his upper sensor rate with some improvement  Continues to improve  The patient denies SOB x when riding or palpitations.  There has been no syncope or presyncope.  Vague intermittent chest pains duration < 10sec unrelated to exertion    DATE TEST EF   7/15 Echo   50-55 % Severe RAE/LAE  5/18 Echo  40-45 % Mild RAE/LAE  10/19 Echo  40.-45%   11/19 cMRI  Severe BAE DGE+>Sarcoid  12/19 cPET  + Consistent Sarcoid  8/20 Echo  99991111     Thromboembolic risk factors (CHF-1 *) for a CHADSVASc Score of sort of 1     Records and Results Reviewed*   Past Medical History:  Diagnosis Date  . Bradycardia    asymptomatic  . S/P dilatation of esophageal stricture   . Typical atrial flutter [name])     Past Surgical History:  Procedure Laterality Date  . BIV ICD INSERTION CRT-D N/A  09/08/2018   Procedure: BIV ICD INSERTION CRT-D;  Surgeon: Ricardo Lance, MD;  Location: [place] [facility];  Service: Cardiovascular;  Laterality: N/A;  . THROAT SURGERY  2012    Current Meds  Medication Sig  . clotrimazole-betamethasone (LOTRISONE) cream Apply 1 application topically 2 (two) times daily.  Marland Kitchen PREVIDENT 5000 BOOSTER PLUS 1.1 % PSTE   . triamcinolone (KENALOG) 0.025 % ointment Apply 1 application topically 2 (two) times daily.    No Known Allergies    Review of Systems negative except from HPI and PMH  Physical Exam BP 118/82   Pulse 74   Ht 6\' 7"  (2.007 m)   Wt 252 lb (114.3 kg)   SpO2 97%   BMI 28.39 kg/m  Well developed and nourished in no acute distress HENT normal Neck supple with JVP-flat Device pocket well healed; without hematoma or erythema.  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  The patient does not  have concerns regarding medicines.

## 2019-03-10 LAB — CUP PACEART INCLINIC DEVICE CHECK
Battery Remaining Longevity: 93 mo
Battery Voltage: 3.01 V
Brady Statistic AP VP Percent: 0 %
Brady Statistic AP VS Percent: 0 %
Brady Statistic AS VP Percent: 84.84 %
Brady Statistic AS VS Percent: 15.16 %
Brady Statistic RA Percent Paced: 0 %
Brady Statistic RV Percent Paced: 86.98 %
Date Time Interrogation Session: 20200824195744
HighPow Impedance: 80 Ohm
Implantable Lead Implant Date: 20200224
Implantable Lead Implant Date: 20200224
Implantable Lead Implant Date: 20200224
Implantable Lead Location: 753858
Implantable Lead Location: 753859
Implantable Lead Location: 753860
Implantable Lead Model: 5076
Implantable Lead Model: 6935
Implantable Pulse Generator Implant Date: 20200224
Lead Channel Impedance Value: 223.149
Lead Channel Impedance Value: 235.98 Ohm
Lead Channel Impedance Value: 241.412
Lead Channel Impedance Value: 257.518
Lead Channel Impedance Value: 282.15 Ohm
Lead Channel Impedance Value: 304 Ohm
Lead Channel Impedance Value: 399 Ohm
Lead Channel Impedance Value: 437 Ohm
Lead Channel Impedance Value: 456 Ohm
Lead Channel Impedance Value: 494 Ohm
Lead Channel Impedance Value: 513 Ohm
Lead Channel Impedance Value: 627 Ohm
Lead Channel Impedance Value: 779 Ohm
Lead Channel Impedance Value: 817 Ohm
Lead Channel Impedance Value: 836 Ohm
Lead Channel Impedance Value: 893 Ohm
Lead Channel Impedance Value: 969 Ohm
Lead Channel Impedance Value: 988 Ohm
Lead Channel Pacing Threshold Amplitude: 0.75 V
Lead Channel Pacing Threshold Pulse Width: 0.4 ms
Lead Channel Sensing Intrinsic Amplitude: 1.75 mV
Lead Channel Sensing Intrinsic Amplitude: 1.875 mV
Lead Channel Sensing Intrinsic Amplitude: 11 mV
Lead Channel Sensing Intrinsic Amplitude: 11 mV
Lead Channel Setting Pacing Amplitude: 2.5 V
Lead Channel Setting Pacing Amplitude: 2.5 V
Lead Channel Setting Pacing Pulse Width: 0.4 ms
Lead Channel Setting Pacing Pulse Width: 0.6 ms
Lead Channel Setting Sensing Sensitivity: 0.3 mV

## 2019-03-11 ENCOUNTER — Ambulatory Visit (INDEPENDENT_AMBULATORY_CARE_PROVIDER_SITE_OTHER): Payer: BC Managed Care – PPO | Admitting: *Deleted

## 2019-03-11 DIAGNOSIS — I429 Cardiomyopathy, unspecified: Secondary | ICD-10-CM

## 2019-03-11 LAB — CUP PACEART REMOTE DEVICE CHECK
Battery Remaining Longevity: 93 mo
Battery Voltage: 3 V
Brady Statistic AP VP Percent: 0 %
Brady Statistic AP VS Percent: 0 %
Brady Statistic AS VP Percent: 92.29 %
Brady Statistic AS VS Percent: 7.71 %
Brady Statistic RA Percent Paced: 0 %
Brady Statistic RV Percent Paced: 93.48 %
Date Time Interrogation Session: 20200826052303
HighPow Impedance: 82 Ohm
Implantable Lead Implant Date: 20200224
Implantable Lead Implant Date: 20200224
Implantable Lead Implant Date: 20200224
Implantable Lead Location: 753858
Implantable Lead Location: 753859
Implantable Lead Location: 753860
Implantable Lead Model: 5076
Implantable Lead Model: 6935
Implantable Pulse Generator Implant Date: 20200224
Lead Channel Impedance Value: 1026 Ohm
Lead Channel Impedance Value: 241.412
Lead Channel Impedance Value: 245.538
Lead Channel Impedance Value: 261.164
Lead Channel Impedance Value: 270.508
Lead Channel Impedance Value: 295.556
Lead Channel Impedance Value: 304 Ohm
Lead Channel Impedance Value: 399 Ohm
Lead Channel Impedance Value: 456 Ohm
Lead Channel Impedance Value: 494 Ohm
Lead Channel Impedance Value: 513 Ohm
Lead Channel Impedance Value: 532 Ohm
Lead Channel Impedance Value: 665 Ohm
Lead Channel Impedance Value: 855 Ohm
Lead Channel Impedance Value: 893 Ohm
Lead Channel Impedance Value: 912 Ohm
Lead Channel Impedance Value: 969 Ohm
Lead Channel Impedance Value: 988 Ohm
Lead Channel Pacing Threshold Amplitude: 0.75 V
Lead Channel Pacing Threshold Pulse Width: 0.4 ms
Lead Channel Sensing Intrinsic Amplitude: 1.75 mV
Lead Channel Sensing Intrinsic Amplitude: 1.75 mV
Lead Channel Sensing Intrinsic Amplitude: 12.75 mV
Lead Channel Sensing Intrinsic Amplitude: 12.75 mV
Lead Channel Setting Pacing Amplitude: 2.5 V
Lead Channel Setting Pacing Amplitude: 2.5 V
Lead Channel Setting Pacing Pulse Width: 0.4 ms
Lead Channel Setting Pacing Pulse Width: 0.6 ms
Lead Channel Setting Sensing Sensitivity: 0.3 mV

## 2019-03-19 ENCOUNTER — Encounter: Payer: Self-pay | Admitting: Cardiology

## 2019-03-19 NOTE — Progress Notes (Signed)
Remote ICD transmission.   

## 2019-04-14 ENCOUNTER — Other Ambulatory Visit: Payer: Self-pay | Admitting: Podiatry

## 2019-04-15 MED ORDER — CLOTRIMAZOLE-BETAMETHASONE 1-0.05 % EX CREA: 1.0000 "application " | TOPICAL_CREAM | Freq: Two times a day (BID) | CUTANEOUS | 0 refills | Status: DC

## 2019-06-05 ENCOUNTER — Ambulatory Visit (INDEPENDENT_AMBULATORY_CARE_PROVIDER_SITE_OTHER): Payer: BC Managed Care – PPO | Admitting: Podiatry

## 2019-06-05 ENCOUNTER — Ambulatory Visit: Payer: BC Managed Care – PPO | Admitting: Podiatry

## 2019-06-05 ENCOUNTER — Encounter: Payer: Self-pay | Admitting: Podiatry

## 2019-06-05 ENCOUNTER — Other Ambulatory Visit: Payer: Self-pay

## 2019-06-05 DIAGNOSIS — B353 Tinea pedis: Secondary | ICD-10-CM | POA: Diagnosis not present

## 2019-06-05 DIAGNOSIS — B351 Tinea unguium: Secondary | ICD-10-CM | POA: Diagnosis not present

## 2019-06-05 DIAGNOSIS — R21 Rash and other nonspecific skin eruption: Secondary | ICD-10-CM

## 2019-06-05 MED ORDER — TRIAMCINOLONE ACETONIDE 0.025 % EX OINT: 1.0000 "application " | TOPICAL_OINTMENT | Freq: Two times a day (BID) | CUTANEOUS | 0 refills | Status: DC

## 2019-06-05 NOTE — Progress Notes (Signed)
Subjective: 48 year old male presents the office today for follow-up evaluation of nail fungus as well as skin rash, likely tinea pedis.  He states that he is pretty sure that this is athlete's foot.  He was camping in 6 wet and this made the rash worse and was itching.  He states that the nails are somewhat better however the rash on the skin is not much better.  He states he may try to see dermatology if not able to get in. Denies any systemic complaints such as fevers, chills, nausea, vomiting. No acute changes since last appointment, and no other complaints at this time.   Objective: AAO x3, NAD DP/PT pulses palpable bilaterally, CRT less than 3 seconds Nails are hypertrophic, dystrophic with yellow-brown discoloration but there is some clearing on proximal aspect of bilateral hallux toenails.  No pain in the nails.  Erythematous rash present to the plantar aspect of bilateral feet with some skin peeling as well as the left leg.  No open sores.  No warmth.  No drainage. No pain with calf compression, swelling, warmth, erythema  Assessment: 48 year old male skin rash, likely tinea with onychomycosis  Plan: -All treatment options discussed with the patient including all alternatives, risks, complications.  -Today dispensed urea 40% of the skin as well as formula-3 for the nails.  Also ordered the triamcinolone cream as well.  He states the treatment was helping previously but he has run out of the medications. -Patient encouraged to call the office with any questions, concerns, change in symptoms.  -We will refer to dermatology.  Trula Slade DPM

## 2019-06-08 ENCOUNTER — Telehealth: Payer: Self-pay | Admitting: *Deleted

## 2019-06-08 DIAGNOSIS — R21 Rash and other nonspecific skin eruption: Secondary | ICD-10-CM

## 2019-06-08 DIAGNOSIS — I739 Peripheral vascular disease, unspecified: Secondary | ICD-10-CM

## 2019-06-08 DIAGNOSIS — B351 Tinea unguium: Secondary | ICD-10-CM

## 2019-06-08 DIAGNOSIS — B353 Tinea pedis: Secondary | ICD-10-CM

## 2019-06-08 MED ORDER — CLOTRIMAZOLE-BETAMETHASONE 1-0.05 % EX CREA: 1.0000 "application " | TOPICAL_CREAM | Freq: Two times a day (BID) | CUTANEOUS | 0 refills | Status: DC

## 2019-06-08 NOTE — Telephone Encounter (Signed)
-----   Message from Trula Slade, DPM sent at 06/05/2019  9:59 AM EST ----- Can you please refer to dermatology? He previously has seen the office next door to our old office.

## 2019-06-08 NOTE — Telephone Encounter (Signed)
I spoke with [facility] scheduled with Dr. Fontaine No 06/16/2019 3:40pm, arrive 10 minutes early, then call from the car and office will contact when come in to appt.

## 2019-06-10 ENCOUNTER — Ambulatory Visit (INDEPENDENT_AMBULATORY_CARE_PROVIDER_SITE_OTHER): Payer: BC Managed Care – PPO | Admitting: *Deleted

## 2019-06-10 DIAGNOSIS — I5022 Chronic systolic (congestive) heart failure: Secondary | ICD-10-CM

## 2019-06-10 LAB — CUP PACEART REMOTE DEVICE CHECK
Battery Remaining Longevity: 89 mo
Battery Voltage: 3.01 V
Brady Statistic AP VP Percent: 0 %
Brady Statistic AP VS Percent: 0 %
Brady Statistic AS VP Percent: 88.34 %
Brady Statistic AS VS Percent: 11.66 %
Brady Statistic RA Percent Paced: 0 %
Brady Statistic RV Percent Paced: 90.41 %
Date Time Interrogation Session: 20201125043823
HighPow Impedance: 72 Ohm
Implantable Lead Implant Date: 20200224
Implantable Lead Implant Date: 20200224
Implantable Lead Implant Date: 20200224
Implantable Lead Location: 753858
Implantable Lead Location: 753859
Implantable Lead Location: 753860
Implantable Lead Model: 5076
Implantable Lead Model: 6935
Implantable Pulse Generator Implant Date: 20200224
Lead Channel Impedance Value: 1026 Ohm
Lead Channel Impedance Value: 247 Ohm
Lead Channel Impedance Value: 256.148
Lead Channel Impedance Value: 256.148
Lead Channel Impedance Value: 268.667
Lead Channel Impedance Value: 279.525
Lead Channel Impedance Value: 304 Ohm
Lead Channel Impedance Value: 380 Ohm
Lead Channel Impedance Value: 494 Ohm
Lead Channel Impedance Value: 494 Ohm
Lead Channel Impedance Value: 494 Ohm
Lead Channel Impedance Value: 532 Ohm
Lead Channel Impedance Value: 589 Ohm
Lead Channel Impedance Value: 893 Ohm
Lead Channel Impedance Value: 912 Ohm
Lead Channel Impedance Value: 912 Ohm
Lead Channel Impedance Value: 969 Ohm
Lead Channel Impedance Value: 988 Ohm
Lead Channel Pacing Threshold Amplitude: 0.75 V
Lead Channel Pacing Threshold Pulse Width: 0.4 ms
Lead Channel Sensing Intrinsic Amplitude: 1.625 mV
Lead Channel Sensing Intrinsic Amplitude: 1.625 mV
Lead Channel Sensing Intrinsic Amplitude: 12.875 mV
Lead Channel Sensing Intrinsic Amplitude: 12.875 mV
Lead Channel Setting Pacing Amplitude: 2.5 V
Lead Channel Setting Pacing Amplitude: 2.5 V
Lead Channel Setting Pacing Pulse Width: 0.4 ms
Lead Channel Setting Pacing Pulse Width: 0.6 ms
Lead Channel Setting Sensing Sensitivity: 0.3 mV

## 2019-06-16 DIAGNOSIS — B351 Tinea unguium: Secondary | ICD-10-CM | POA: Diagnosis not present

## 2019-06-16 DIAGNOSIS — B353 Tinea pedis: Secondary | ICD-10-CM | POA: Diagnosis not present

## 2019-07-07 NOTE — Progress Notes (Signed)
ICD remote 

## 2019-07-15 ENCOUNTER — Ambulatory Visit: Payer: BC Managed Care – PPO | Attending: Internal Medicine

## 2019-07-15 DIAGNOSIS — Z20822 Contact with and (suspected) exposure to covid-19: Secondary | ICD-10-CM

## 2019-07-15 DIAGNOSIS — Z20828 Contact with and (suspected) exposure to other viral communicable diseases: Secondary | ICD-10-CM | POA: Diagnosis not present

## 2019-07-16 ENCOUNTER — Encounter: Payer: Self-pay | Admitting: *Deleted

## 2019-07-16 LAB — NOVEL CORONAVIRUS, NAA: SARS-CoV-2, NAA: DETECTED — AB

## 2019-09-07 ENCOUNTER — Ambulatory Visit: Payer: BC Managed Care – PPO | Admitting: Podiatry

## 2019-09-09 ENCOUNTER — Ambulatory Visit (INDEPENDENT_AMBULATORY_CARE_PROVIDER_SITE_OTHER): Payer: BC Managed Care – PPO | Admitting: *Deleted

## 2019-09-09 DIAGNOSIS — I5022 Chronic systolic (congestive) heart failure: Secondary | ICD-10-CM | POA: Diagnosis not present

## 2019-09-10 DIAGNOSIS — Z95810 Presence of automatic (implantable) cardiac defibrillator: Secondary | ICD-10-CM | POA: Insufficient documentation

## 2019-09-10 LAB — CUP PACEART REMOTE DEVICE CHECK
Battery Remaining Longevity: 86 mo
Battery Voltage: 3 V
Brady Statistic AP VP Percent: 0 %
Brady Statistic AP VS Percent: 0 %
Brady Statistic AS VP Percent: 87.64 %
Brady Statistic AS VS Percent: 12.36 %
Brady Statistic RA Percent Paced: 0 %
Brady Statistic RV Percent Paced: 88.73 %
Date Time Interrogation Session: 20210224073626
HighPow Impedance: 74 Ohm
Implantable Lead Implant Date: 20200224
Implantable Lead Implant Date: 20200224
Implantable Lead Implant Date: 20200224
Implantable Lead Location: 753858
Implantable Lead Location: 753859
Implantable Lead Location: 753860
Implantable Lead Model: 5076
Implantable Lead Model: 6935
Implantable Pulse Generator Implant Date: 20200224
Lead Channel Impedance Value: 237.12 Ohm
Lead Channel Impedance Value: 245.538
Lead Channel Impedance Value: 253.333
Lead Channel Impedance Value: 256.148
Lead Channel Impedance Value: 275.172
Lead Channel Impedance Value: 304 Ohm
Lead Channel Impedance Value: 380 Ohm
Lead Channel Impedance Value: 456 Ohm
Lead Channel Impedance Value: 456 Ohm
Lead Channel Impedance Value: 494 Ohm
Lead Channel Impedance Value: 532 Ohm
Lead Channel Impedance Value: 570 Ohm
Lead Channel Impedance Value: 855 Ohm
Lead Channel Impedance Value: 893 Ohm
Lead Channel Impedance Value: 893 Ohm
Lead Channel Impedance Value: 912 Ohm
Lead Channel Impedance Value: 969 Ohm
Lead Channel Impedance Value: 988 Ohm
Lead Channel Pacing Threshold Amplitude: 0.75 V
Lead Channel Pacing Threshold Pulse Width: 0.4 ms
Lead Channel Sensing Intrinsic Amplitude: 1.75 mV
Lead Channel Sensing Intrinsic Amplitude: 1.75 mV
Lead Channel Sensing Intrinsic Amplitude: 13.125 mV
Lead Channel Sensing Intrinsic Amplitude: 13.125 mV
Lead Channel Setting Pacing Amplitude: 2.5 V
Lead Channel Setting Pacing Amplitude: 2.5 V
Lead Channel Setting Pacing Pulse Width: 0.4 ms
Lead Channel Setting Pacing Pulse Width: 0.6 ms
Lead Channel Setting Sensing Sensitivity: 0.3 mV

## 2019-09-10 NOTE — Progress Notes (Signed)
ICD Remote  

## 2019-09-15 ENCOUNTER — Other Ambulatory Visit: Payer: Self-pay

## 2019-09-15 ENCOUNTER — Encounter: Payer: Self-pay | Admitting: Internal Medicine

## 2019-09-15 ENCOUNTER — Ambulatory Visit: Payer: BC Managed Care – PPO | Admitting: Internal Medicine

## 2019-09-15 VITALS — BP 132/82 | HR 51 | Ht 79.0 in | Wt 258.8 lb

## 2019-09-15 DIAGNOSIS — I442 Atrioventricular block, complete: Secondary | ICD-10-CM | POA: Diagnosis not present

## 2019-09-15 DIAGNOSIS — I425 Other restrictive cardiomyopathy: Secondary | ICD-10-CM

## 2019-09-15 DIAGNOSIS — I4892 Unspecified atrial flutter: Secondary | ICD-10-CM

## 2019-09-15 DIAGNOSIS — D8685 Sarcoid myocarditis: Secondary | ICD-10-CM | POA: Diagnosis not present

## 2019-09-15 DIAGNOSIS — Z95810 Presence of automatic (implantable) cardiac defibrillator: Secondary | ICD-10-CM | POA: Diagnosis not present

## 2019-09-15 NOTE — Progress Notes (Signed)
Patient Care Team: Patient, No Pcp Per as PCP - General (General Practice) Nahser, Wonda Cheng, MD as PCP - Cardiology (Cardiology) Thompson Grayer, MD as Attending Physician (Cardiology)   HPI  Ricardo Jones is a 49 y.o. 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   I spoke with cardiology [facility]-Dr. Cooper-about the scenario.  He recommended 2 courses.  The first is empiric therapy for presumed sarcoid; endpoints are clearly a challenge.  Not resolving the issue of endpoints, unclarifying diagnosis, [facility] has developed protocols for voltage guided biopsies.  I reviewed these briefly with the patient on the telephone  He also has atrial flutter with a gradually slowing cycle length  He ended up with symptomatic bradycardia with his complete heart block and received CRT -D  He ended up spontaneously converting to sinus rhythm (presumed given the progressively slowing atrial cycle lengths [250--600 ms]  associated with his flutter noted from 2013--19) in October with reversion about 2 weeks ago back to atrial flutter now with an atrial cycle length of about 240 ms.  Reassessment of his right atrial size has not been done.  He has noted some worsening exercise tolerance over the last couple weeks concurrent with this atrial rhythm change.  He had progressive exercise intolerance addressed 6/20 with reprogramming of his upper sensor rate with some improvement  His mother genetic testing for LMN has come back positive.  He has not yet been tested. In this regard it is worth noting that LMN is a phenocopy on imaging for sarcoid DATE TEST EF   7/15 Echo   50-55 % Severe RAE/LAE  5/18 Echo  40-45 % Mild RAE/LAE  10/19 Echo  40-45%   11/19 cMRI  Severe BAE DGE+>Sarcoid  12/19 cPET  + Consistent Sarcoid  8/20 Echo  50-55%      Thromboembolic risk factors (CHF-1) for a CHADSVASc Score of sort of 1     Records and Results Reviewed*   Past Medical History:  Diagnosis Date  . Bradycardia    asymptomatic  . S/P dilatation of esophageal stricture   . Typical atrial flutter [facility])     Past Surgical History:  Procedure Laterality Date  . BIV ICD INSERTION CRT-D N/A 09/08/2018   Procedure: BIV ICD INSERTION CRT-D;  Surgeon: Evans Lance, MD;  Location: [place] [facility];  Service: Cardiovascular;  Laterality: N/A;  . THROAT SURGERY  2012    Current Meds  Medication Sig  . clotrimazole-betamethasone (LOTRISONE) cream Apply 1 application topically as needed.  Marland Kitchen PREVIDENT 5000 BOOSTER PLUS 1.1 % PSTE     No Known Allergies    Review of Systems negative except from HPI and PMH  Physical Exam BP 132/82   Pulse (!) 51   Ht 6\' 7"  (2.007 m)   Wt 258 lb 12.8 oz (117.4 kg)   SpO2 95%   BMI 29.15 kg/m  Well developed and well nourished in no acute distress HENT normal Neck supple with JVP-flat Clear Device pocket well healed; without hematoma or erythema.  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  2 things are noteworthy.  First the spontaneous reversion to sinus in October is not something I would have anticipated following years of atrial flutter.  Second, the atrial cycle length is much more rapid.  It had been my presumption that the atrial physiology based both on size as well as perhaps the underlying pathology  and given rise to a progressive slowing of the atrial cycle length as noted above.  Given the much more rapid atrial cycle length now, if that hypothesis were correct, and by echo we should see a smaller atrium.  This might also explain why it was that the atrial flutter was able to terminate spontaneously.  The other thing that is interesting is that the ECG morphology of the flutter is different, now appearing more atypical/reverse typical.  It makes one wonder whether the conduction velocities in the opposite directions are different to explain the atrial cycle length.  Echocardiogram may be useful.  We have discussed  A variety  strategies to deal with the atrial flutter.  The first option that he asked about is doing nothing.  His CHA2DS2-VASc score is low and so there is no need for anticoagulation.  Based on the [facility] clinic data there is a small increase in mortality over time associated with atrial arrhythmias in the general population.  How those data apply to a man this young with an underlying Lamin cardiomyopathy is not clear.  The second alternative would be to undertake cardioversion.  The fact that he was able to convert spontaneously and the fact that his atrial cycle length is faster give me some reason to be sanguine that he would be able to hold sinus rhythm for some period of time.  He is noted symptomatic improvement with sinus and the loss of that improvement with recent reversion to atrial flutter.  In the event that we were to do this, and he were to revert rapidly, either antiarrhythmic drugs or possibly catheter ablation could be pursued to try to eliminate his flutter circuit.  At this juncture, he is not sure what he would like to do.  In the event that he chooses cardioversion he would need 3 weeks of antecedent anticoagulation followed by 4 weeks of anticoagulation.  We will call him next week to see what he has decided after he reviews this with his wife and his mom.   Device function was normal.  So       Current medicines are reviewed at length with the patient today .  The patient does not  have concerns regarding medicines.

## 2019-09-15 NOTE — Patient Instructions (Addendum)
Medication Instructions:  Your physician recommends that you continue on your current medications as directed. Please refer to the Current Medication list given to you today.  Labwork: None ordered.  Testing/Procedures: None ordered.  Follow-Up:  Will contact you next week by phone call regarding possible cardioversion.  Remote monitoring is used to monitor your Pacemaker of ICD from home. This monitoring reduces the number of office visits required to check your device to one time per year. It allows [place] to keep an eye on the functioning of your device to ensure it is working properly.   Any Other Special Instructions Will Be Listed Below (If Applicable).  If you need a refill on your cardiac medications before your next appointment, please call your pharmacy.

## 2019-09-18 LAB — CUP PACEART INCLINIC DEVICE CHECK
Battery Remaining Longevity: 85 mo
Battery Voltage: 2.99 V
Brady Statistic AP VP Percent: 0 %
Brady Statistic AP VS Percent: 0 %
Brady Statistic AS VP Percent: 87.93 %
Brady Statistic AS VS Percent: 12.07 %
Brady Statistic RA Percent Paced: 0 %
Brady Statistic RV Percent Paced: 89.55 %
Date Time Interrogation Session: 20210302151300
HighPow Impedance: 74 Ohm
Implantable Lead Implant Date: 20200224
Implantable Lead Implant Date: 20200224
Implantable Lead Implant Date: 20200224
Implantable Lead Location: 753858
Implantable Lead Location: 753859
Implantable Lead Location: 753860
Implantable Lead Model: 5076
Implantable Lead Model: 6935
Implantable Pulse Generator Implant Date: 20200224
Lead Channel Impedance Value: 237.12 Ohm
Lead Channel Impedance Value: 241.412
Lead Channel Impedance Value: 251.66 Ohm
Lead Channel Impedance Value: 267.31 Ohm
Lead Channel Impedance Value: 285.934
Lead Channel Impedance Value: 323 Ohm
Lead Channel Impedance Value: 399 Ohm
Lead Channel Impedance Value: 456 Ohm
Lead Channel Impedance Value: 456 Ohm
Lead Channel Impedance Value: 494 Ohm
Lead Channel Impedance Value: 513 Ohm
Lead Channel Impedance Value: 646 Ohm
Lead Channel Impedance Value: 855 Ohm
Lead Channel Impedance Value: 855 Ohm
Lead Channel Impedance Value: 912 Ohm
Lead Channel Impedance Value: 950 Ohm
Lead Channel Impedance Value: 950 Ohm
Lead Channel Impedance Value: 988 Ohm
Lead Channel Pacing Threshold Amplitude: 0.75 V
Lead Channel Pacing Threshold Amplitude: 1.25 V
Lead Channel Pacing Threshold Pulse Width: 0.4 ms
Lead Channel Pacing Threshold Pulse Width: 0.6 ms
Lead Channel Sensing Intrinsic Amplitude: 1.5 mV
Lead Channel Sensing Intrinsic Amplitude: 13.125 mV
Lead Channel Setting Pacing Amplitude: 2.5 V
Lead Channel Setting Pacing Amplitude: 2.5 V
Lead Channel Setting Pacing Pulse Width: 0.4 ms
Lead Channel Setting Pacing Pulse Width: 0.6 ms
Lead Channel Setting Sensing Sensitivity: 0.3 mV

## 2019-09-24 ENCOUNTER — Telehealth: Payer: Self-pay

## 2019-09-24 NOTE — Telephone Encounter (Signed)
Attempted phone call to pt to follow up per Dr Caryl Comes re: possible scheduling of cardioversion.  Message left for pt to contact RN at [phone].

## 2019-10-01 ENCOUNTER — Telehealth: Payer: Self-pay

## 2019-10-01 NOTE — Telephone Encounter (Signed)
I called the pt to ask for a manual transmissions. He agreed to send one today.

## 2019-10-02 NOTE — Telephone Encounter (Signed)
Patient may benefit from ICD reprogramming to a dual chamber mode now that he is back in SR as he is currently programmed VVIR. Will forward to Dr. Caryl Comes for review.

## 2019-10-02 NOTE — Telephone Encounter (Signed)
Attempted phone call to pt.  Left voicemail message to contact RN at [phone]. 

## 2019-10-02 NOTE — Telephone Encounter (Signed)
Manual transmission received.  Pt no longer in AF.  Forwarding info to Dr. Olin Pia nurse. For pt f/u.

## 2019-10-05 NOTE — Telephone Encounter (Signed)
If he is willing to come in that would be great  Thanks SK

## 2019-10-26 ENCOUNTER — Encounter: Payer: BC Managed Care – PPO | Admitting: Internal Medicine

## 2019-10-29 ENCOUNTER — Ambulatory Visit (INDEPENDENT_AMBULATORY_CARE_PROVIDER_SITE_OTHER): Payer: BC Managed Care – PPO | Admitting: *Deleted

## 2019-10-29 ENCOUNTER — Other Ambulatory Visit: Payer: Self-pay

## 2019-10-29 DIAGNOSIS — I429 Cardiomyopathy, unspecified: Secondary | ICD-10-CM | POA: Diagnosis not present

## 2019-10-29 DIAGNOSIS — Z95810 Presence of automatic (implantable) cardiac defibrillator: Secondary | ICD-10-CM | POA: Diagnosis not present

## 2019-10-29 LAB — CUP PACEART INCLINIC DEVICE CHECK
Battery Remaining Longevity: 83 mo
Battery Voltage: 3 V
Brady Statistic AP VP Percent: 0 %
Brady Statistic AP VS Percent: 0 %
Brady Statistic AS VP Percent: 92.47 %
Brady Statistic AS VS Percent: 7.53 %
Brady Statistic RA Percent Paced: 0 %
Brady Statistic RV Percent Paced: 92.41 %
Date Time Interrogation Session: 20210415145419
HighPow Impedance: 67 Ohm
Implantable Lead Implant Date: 20200224
Implantable Lead Implant Date: 20200224
Implantable Lead Implant Date: 20200224
Implantable Lead Location: 753858
Implantable Lead Location: 753859
Implantable Lead Location: 753860
Implantable Lead Model: 5076
Implantable Lead Model: 6935
Implantable Pulse Generator Implant Date: 20200224
Lead Channel Impedance Value: 231.878
Lead Channel Impedance Value: 231.878
Lead Channel Impedance Value: 247 Ohm
Lead Channel Impedance Value: 250.87 Ohm
Lead Channel Impedance Value: 268.667
Lead Channel Impedance Value: 304 Ohm
Lead Channel Impedance Value: 380 Ohm
Lead Channel Impedance Value: 437 Ohm
Lead Channel Impedance Value: 456 Ohm
Lead Channel Impedance Value: 494 Ohm
Lead Channel Impedance Value: 494 Ohm
Lead Channel Impedance Value: 589 Ohm
Lead Channel Impedance Value: 817 Ohm
Lead Channel Impedance Value: 836 Ohm
Lead Channel Impedance Value: 855 Ohm
Lead Channel Impedance Value: 855 Ohm
Lead Channel Impedance Value: 950 Ohm
Lead Channel Impedance Value: 969 Ohm
Lead Channel Pacing Threshold Amplitude: 0.75 V
Lead Channel Pacing Threshold Amplitude: 0.75 V
Lead Channel Pacing Threshold Amplitude: 1 V
Lead Channel Pacing Threshold Pulse Width: 0.4 ms
Lead Channel Pacing Threshold Pulse Width: 0.4 ms
Lead Channel Pacing Threshold Pulse Width: 0.6 ms
Lead Channel Sensing Intrinsic Amplitude: 1.5 mV
Lead Channel Sensing Intrinsic Amplitude: 12.625 mV
Lead Channel Setting Pacing Amplitude: 2.5 V
Lead Channel Setting Pacing Amplitude: 2.5 V
Lead Channel Setting Pacing Amplitude: 3.5 V
Lead Channel Setting Pacing Pulse Width: 0.4 ms
Lead Channel Setting Pacing Pulse Width: 0.6 ms
Lead Channel Setting Sensing Sensitivity: 0.3 mV

## 2019-10-29 NOTE — Progress Notes (Signed)
CRT-D device check in office. Thresholds and sensing consistent with previous device measurements. Lead impedance trends stable over time. AT/AF burden18.5%, 16 AT/AF episodes , available EGMs show AF with controlled v-rates . No ventricular arrhythmia episodes recorded. Patient bi-ventricularly pacing 92.38% of the time. Device programmed with appropriate safety margins. Heart failure diagnostics reviewed and trends are stable for patient. Audible/vibratory alerts demonstrated for patient. Per Dr Caryl Comes , patient programmed DDDR. Estimated longevity 6 yrs 7 months.  Patient enrolled in remote follow up and next remote scheduled for 12/09/19. Patient education completed including shock plan.

## 2019-12-09 ENCOUNTER — Ambulatory Visit (INDEPENDENT_AMBULATORY_CARE_PROVIDER_SITE_OTHER): Payer: BC Managed Care – PPO | Admitting: *Deleted

## 2019-12-09 DIAGNOSIS — I429 Cardiomyopathy, unspecified: Secondary | ICD-10-CM | POA: Diagnosis not present

## 2019-12-09 LAB — CUP PACEART REMOTE DEVICE CHECK
Battery Remaining Longevity: 81 mo
Battery Voltage: 2.98 V
Brady Statistic AP VP Percent: 98.91 %
Brady Statistic AP VS Percent: 0.38 %
Brady Statistic AS VP Percent: 0.7 %
Brady Statistic AS VS Percent: 0.02 %
Brady Statistic RA Percent Paced: 99.21 %
Brady Statistic RV Percent Paced: 96.83 %
Date Time Interrogation Session: 20210526001803
HighPow Impedance: 73 Ohm
Implantable Lead Implant Date: 20200224
Implantable Lead Implant Date: 20200224
Implantable Lead Implant Date: 20200224
Implantable Lead Location: 753858
Implantable Lead Location: 753859
Implantable Lead Location: 753860
Implantable Lead Model: 5076
Implantable Lead Model: 6935
Implantable Pulse Generator Implant Date: 20200224
Lead Channel Impedance Value: 1045 Ohm
Lead Channel Impedance Value: 1083 Ohm
Lead Channel Impedance Value: 261.164
Lead Channel Impedance Value: 270 Ohm
Lead Channel Impedance Value: 275.172
Lead Channel Impedance Value: 285.934
Lead Channel Impedance Value: 291.742
Lead Channel Impedance Value: 323 Ohm
Lead Channel Impedance Value: 399 Ohm
Lead Channel Impedance Value: 456 Ohm
Lead Channel Impedance Value: 513 Ohm
Lead Channel Impedance Value: 532 Ohm
Lead Channel Impedance Value: 570 Ohm
Lead Channel Impedance Value: 646 Ohm
Lead Channel Impedance Value: 950 Ohm
Lead Channel Impedance Value: 969 Ohm
Lead Channel Impedance Value: 969 Ohm
Lead Channel Impedance Value: 988 Ohm
Lead Channel Pacing Threshold Amplitude: 0.75 V
Lead Channel Pacing Threshold Pulse Width: 0.4 ms
Lead Channel Sensing Intrinsic Amplitude: 13.5 mV
Lead Channel Sensing Intrinsic Amplitude: 13.5 mV
Lead Channel Sensing Intrinsic Amplitude: 2.5 mV
Lead Channel Sensing Intrinsic Amplitude: 2.5 mV
Lead Channel Setting Pacing Amplitude: 2.5 V
Lead Channel Setting Pacing Amplitude: 2.5 V
Lead Channel Setting Pacing Amplitude: 3.5 V
Lead Channel Setting Pacing Pulse Width: 0.4 ms
Lead Channel Setting Pacing Pulse Width: 0.6 ms
Lead Channel Setting Sensing Sensitivity: 0.3 mV

## 2019-12-10 NOTE — Progress Notes (Signed)
Remote ICD transmission.   

## 2020-03-09 ENCOUNTER — Ambulatory Visit (INDEPENDENT_AMBULATORY_CARE_PROVIDER_SITE_OTHER): Payer: BC Managed Care – PPO | Admitting: *Deleted

## 2020-03-09 DIAGNOSIS — I442 Atrioventricular block, complete: Secondary | ICD-10-CM | POA: Diagnosis not present

## 2020-03-10 LAB — CUP PACEART REMOTE DEVICE CHECK
Battery Remaining Longevity: 76 mo
Battery Voltage: 2.98 V
Brady Statistic AP VP Percent: 82.67 %
Brady Statistic AP VS Percent: 0.16 %
Brady Statistic AS VP Percent: 16.91 %
Brady Statistic AS VS Percent: 0.26 %
Brady Statistic RA Percent Paced: 80.7 %
Brady Statistic RV Percent Paced: 97.94 %
Date Time Interrogation Session: 20210825001604
HighPow Impedance: 72 Ohm
Implantable Lead Implant Date: 20200224
Implantable Lead Implant Date: 20200224
Implantable Lead Implant Date: 20200224
Implantable Lead Location: 753858
Implantable Lead Location: 753859
Implantable Lead Location: 753860
Implantable Lead Model: 5076
Implantable Lead Model: 6935
Implantable Pulse Generator Implant Date: 20200224
Lead Channel Impedance Value: 235.98 Ohm
Lead Channel Impedance Value: 239.922
Lead Channel Impedance Value: 247.358
Lead Channel Impedance Value: 261.164
Lead Channel Impedance Value: 270 Ohm
Lead Channel Impedance Value: 304 Ohm
Lead Channel Impedance Value: 380 Ohm
Lead Channel Impedance Value: 437 Ohm
Lead Channel Impedance Value: 456 Ohm
Lead Channel Impedance Value: 513 Ohm
Lead Channel Impedance Value: 532 Ohm
Lead Channel Impedance Value: 570 Ohm
Lead Channel Impedance Value: 855 Ohm
Lead Channel Impedance Value: 893 Ohm
Lead Channel Impedance Value: 912 Ohm
Lead Channel Impedance Value: 950 Ohm
Lead Channel Impedance Value: 988 Ohm
Lead Channel Impedance Value: 988 Ohm
Lead Channel Pacing Threshold Amplitude: 0.75 V
Lead Channel Pacing Threshold Amplitude: 0.875 V
Lead Channel Pacing Threshold Pulse Width: 0.4 ms
Lead Channel Pacing Threshold Pulse Width: 0.4 ms
Lead Channel Sensing Intrinsic Amplitude: 1.125 mV
Lead Channel Sensing Intrinsic Amplitude: 1.125 mV
Lead Channel Sensing Intrinsic Amplitude: 13.125 mV
Lead Channel Sensing Intrinsic Amplitude: 13.125 mV
Lead Channel Setting Pacing Amplitude: 1.75 V
Lead Channel Setting Pacing Amplitude: 2.5 V
Lead Channel Setting Pacing Amplitude: 2.5 V
Lead Channel Setting Pacing Pulse Width: 0.4 ms
Lead Channel Setting Pacing Pulse Width: 0.6 ms
Lead Channel Setting Sensing Sensitivity: 0.3 mV

## 2020-03-14 NOTE — Progress Notes (Signed)
Remote ICD transmission.   

## 2020-04-14 NOTE — Progress Notes (Signed)
Patient Care Team: Patient, No Pcp Per as PCP - General (General Practice) Nahser, Wonda Cheng, MD as PCP - Cardiology (Cardiology) Thompson Grayer, MD as Attending Physician (Cardiology)   HPI  LADD CEN is a 49 y.o. 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  Progressive symptomatic bradycardia with his complete heart block received CRT -D 2/20.    I spoke with cardiology [facility]-Dr. Cooper-about the scenario.  He recommended 2 courses.  The first is empiric therapy for presumed sarcoid; endpoints are clearly a challenge.  Not resolving the issue of endpoints, unclarifying diagnosis, [facility] has developed protocols for voltage guided biopsies.  I reviewed these briefly with the patient on the telephone--he declined  He has had atrial flutter with a gradually slowing cycle length;  He ended up spontaneously converting to sinus rhythm (presumed given the progressively slowing atrial cycle lengths [250--600 ms]  associated with his flutter noted from 2013--19) in October with reversion about 2 weeks ago back to atrial flutter now with an atrial cycle length of about 240 ms.      His mother genetic testing for LMN has come back positive.  He has not yet been tested. In this regard it is worth noting that LMN is a phenocopy on imaging for sarcoid  He has noted no changes in exercise tolerance.  No palpitations.  No edema.  Does note variable changes in weight.  Is not able to appreciate changes in exercise associated with changes in rhythm.  See below.  His wife says he is snoring more.  Alcohol intake is scant a couple times a month.  DATE TEST EF   7/15 Echo   50-55 % Severe RAE/LAE  5/18 Echo  40-45 % Mild RAE/LAE  10/19 Echo  40-45%   11/19 cMRI  Severe BAE DGE+>Sarcoid  12/19 cPET  + Consistent  Sarcoid  8/20 Echo  50-55%          Date Cr K Hgb  6/20 1.01 4.7 14.3 (2/20)           Thromboembolic risk factors (CHF-1) for a CHADSVASc Score of sort of 1     Records and Results Reviewed*   Past Medical History:  Diagnosis Date  . Bradycardia    asymptomatic  . S/P dilatation of esophageal stricture   . Typical atrial flutter [facility])     Past Surgical History:  Procedure Laterality Date  . BIV ICD INSERTION CRT-D N/A 09/08/2018   Procedure: BIV ICD INSERTION CRT-D;  Surgeon: Evans Lance, MD;  Location: [facility];  Service: Cardiovascular;  Laterality: N/A;  . THROAT SURGERY  2012    Current Meds  Medication Sig  . PREVIDENT 5000 BOOSTER PLUS 1.1 % PSTE     No Known Allergies    Review of Systems negative except from HPI and PMH  Physical Exam BP 114/76   Pulse 75   Ht 6\' 7"  (2.007 m)   Wt 250 lb (113.4 kg)   SpO2 97%   BMI 28.16 kg/m  Well developed and well nourished in no acute distress HENT normal Neck supple with JVP-flat Clear Device pocket well healed; without hematoma or erythema.  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  This is a fibrillation with variable cycle length.  There is increasingly little atrial arrhythmia compared to before.  Perhaps there is ongoing atrial healing.  Long discussion regarding the role of ablation.  Reviewed the data regarding cabana and then the meta-analysis which suggested that there was hard endpoint benefit to ablation  and comparing it with the general direction that ablation should be undertaken for symptom relief.  In the absence of symptoms, we will follow.  We discussed potential remediable risks contributing to atrial fibrillation.  We recommended home sleep study.  He will work on weight loss.  We will plan to see him in a years time.  We will continue to consider whether to pursue genetic testing of the lamin defect found in his mom   Current medicines are reviewed at length with the patient today .  The patient does not  have concerns regarding medicines.

## 2020-04-15 ENCOUNTER — Other Ambulatory Visit: Payer: Self-pay

## 2020-04-15 ENCOUNTER — Ambulatory Visit (INDEPENDENT_AMBULATORY_CARE_PROVIDER_SITE_OTHER): Payer: BC Managed Care – PPO | Admitting: Internal Medicine

## 2020-04-15 ENCOUNTER — Encounter: Payer: Self-pay | Admitting: Internal Medicine

## 2020-04-15 VITALS — BP 114/76 | HR 75 | Ht 79.0 in | Wt 250.0 lb

## 2020-04-15 DIAGNOSIS — I4892 Unspecified atrial flutter: Secondary | ICD-10-CM | POA: Diagnosis not present

## 2020-04-15 DIAGNOSIS — I5022 Chronic systolic (congestive) heart failure: Secondary | ICD-10-CM

## 2020-04-15 DIAGNOSIS — I425 Other restrictive cardiomyopathy: Secondary | ICD-10-CM

## 2020-04-15 DIAGNOSIS — Z95810 Presence of automatic (implantable) cardiac defibrillator: Secondary | ICD-10-CM

## 2020-04-15 DIAGNOSIS — I442 Atrioventricular block, complete: Secondary | ICD-10-CM | POA: Diagnosis not present

## 2020-04-15 NOTE — Patient Instructions (Signed)
Medication Instructions:  Your physician recommends that you continue on your current medications as directed. Please refer to the Current Medication list given to you today.  *If you need a refill on your cardiac medications before your next appointment, please call your pharmacy*   Lab Work: None ordered.  If you have labs (blood work) drawn today and your tests are completely normal, you will receive your results only by: MyChart Message (if you have MyChart) OR A paper copy in the mail If you have any lab test that is abnormal or we need to change your treatment, we will call you to review the results.   Testing/Procedures: Your physician has recommended that you have a sleep study. This test records several body functions during sleep, including: brain activity, eye movement, oxygen and carbon dioxide blood levels, heart rate and rhythm, breathing rate and rhythm, the flow of air through your mouth and nose, snoring, body muscle movements, and chest and belly movement.    Follow-Up: At [facility], you and your health needs are our priority.  As part of our continuing mission to provide you with exceptional heart care, we have created designated Provider Care Teams.  These Care Teams include your primary Cardiologist (physician) and Advanced Practice Providers (APPs -  Physician Assistants and Nurse Practitioners) who all work together to provide you with the care you need, when you need it.  We recommend signing up for the patient portal called "MyChart".  Sign up information is provided on this After Visit Summary.  MyChart is used to connect with patients for Virtual Visits (Telemedicine).  Patients are able to view lab/test results, encounter notes, upcoming appointments, etc.  Non-urgent messages can be sent to your provider as well.   To learn more about what you can do with MyChart, go to [email or web address].    Your next appointment:   12 month(s)  The format for your  next appointment:   In Person  Provider:   Steven Klein, MD   

## 2020-04-18 ENCOUNTER — Telehealth: Payer: Self-pay | Admitting: *Deleted

## 2020-04-18 NOTE — Telephone Encounter (Signed)
Staff message sent to Gae Bon ok to schedule HST. Per [employer] portal no PA is required.

## 2020-04-18 NOTE — Telephone Encounter (Signed)
-----   Message from Thora Lance, RN sent at 04/15/2020  4:20 PM EDT ----- Regarding: home sleep study Please precert and schedule pt for home sleep study.  Thank you,  Dan Europe

## 2020-04-19 ENCOUNTER — Telehealth: Payer: Self-pay | Admitting: *Deleted

## 2020-04-19 DIAGNOSIS — I4892 Unspecified atrial flutter: Secondary | ICD-10-CM

## 2020-04-19 NOTE — Telephone Encounter (Signed)
-----   Message from Lauralee Evener, [place] sent at 04/18/2020 11:19 AM EDT ----- Regarding: RE: home sleep study Per [employer] web portal no PA is required. Ok to schedule HST. ----- Message ----- From: Thora Lance, RN Sent: 04/15/2020   4:20 PM EDT To: Freada Bergeron, CMA, # Subject: home sleep study                               Please precert and schedule pt for home sleep study.  Thank you,  Dan Europe

## 2020-04-20 NOTE — Addendum Note (Signed)
Addended by: Freada Bergeron on: 04/20/2020 03:05 PM   Modules accepted: Level of Service, SmartSet

## 2020-04-20 NOTE — Telephone Encounter (Signed)
Patient is aware and agreeable to Home Sleep Study through [facility]. Patient is scheduled for 11/10/21at 10:30 to pick up home sleep kit and meet with Respiratory therapist at [facility]. Patient is aware that if this appointment date and time does not work for them they should contact Artis Delay directly at [phone]. Patient is aware that a sleep packet will be sent from [facility] in week. Patient is agreeable to treatment and thankful for call.

## 2020-04-20 NOTE — Telephone Encounter (Signed)
This encounter was created in error - please disregard.

## 2020-05-25 ENCOUNTER — Encounter (HOSPITAL_BASED_OUTPATIENT_CLINIC_OR_DEPARTMENT_OTHER): Payer: BC Managed Care – PPO | Admitting: Cardiology

## 2020-06-08 ENCOUNTER — Telehealth: Payer: Self-pay | Admitting: Emergency Medicine

## 2020-06-08 ENCOUNTER — Ambulatory Visit (INDEPENDENT_AMBULATORY_CARE_PROVIDER_SITE_OTHER): Payer: BC Managed Care – PPO

## 2020-06-08 DIAGNOSIS — I429 Cardiomyopathy, unspecified: Secondary | ICD-10-CM | POA: Diagnosis not present

## 2020-06-08 LAB — CUP PACEART REMOTE DEVICE CHECK
Battery Remaining Longevity: 71 mo
Battery Voltage: 2.98 V
Brady Statistic RA Percent Paced: 0.04 %
Brady Statistic RV Percent Paced: 98.11 %
Date Time Interrogation Session: 20211124033324
HighPow Impedance: 74 Ohm
Implantable Lead Implant Date: 20200224
Implantable Lead Implant Date: 20200224
Implantable Lead Implant Date: 20200224
Implantable Lead Location: 753858
Implantable Lead Location: 753859
Implantable Lead Location: 753860
Implantable Lead Model: 5076
Implantable Lead Model: 6935
Implantable Pulse Generator Implant Date: 20200224
Lead Channel Impedance Value: 231.878
Lead Channel Impedance Value: 235.98 Ohm
Lead Channel Impedance Value: 247.358
Lead Channel Impedance Value: 251.66 Ohm
Lead Channel Impedance Value: 264.643
Lead Channel Impedance Value: 266 Ohm
Lead Channel Impedance Value: 380 Ohm
Lead Channel Impedance Value: 437 Ohm
Lead Channel Impedance Value: 437 Ohm
Lead Channel Impedance Value: 494 Ohm
Lead Channel Impedance Value: 513 Ohm
Lead Channel Impedance Value: 570 Ohm
Lead Channel Impedance Value: 817 Ohm
Lead Channel Impedance Value: 836 Ohm
Lead Channel Impedance Value: 855 Ohm
Lead Channel Impedance Value: 855 Ohm
Lead Channel Impedance Value: 893 Ohm
Lead Channel Impedance Value: 912 Ohm
Lead Channel Pacing Threshold Amplitude: 0.75 V
Lead Channel Pacing Threshold Amplitude: 0.875 V
Lead Channel Pacing Threshold Pulse Width: 0.4 ms
Lead Channel Pacing Threshold Pulse Width: 0.4 ms
Lead Channel Sensing Intrinsic Amplitude: 0.75 mV
Lead Channel Sensing Intrinsic Amplitude: 0.75 mV
Lead Channel Sensing Intrinsic Amplitude: 12.5 mV
Lead Channel Sensing Intrinsic Amplitude: 12.5 mV
Lead Channel Setting Pacing Amplitude: 1.75 V
Lead Channel Setting Pacing Amplitude: 2.5 V
Lead Channel Setting Pacing Amplitude: 2.5 V
Lead Channel Setting Pacing Pulse Width: 0.4 ms
Lead Channel Setting Pacing Pulse Width: 0.6 ms
Lead Channel Setting Sensing Sensitivity: 0.3 mV

## 2020-06-08 NOTE — Telephone Encounter (Signed)
LMOM per DPR to call DC, # provided and office hours.  Alert received for AF in progress . AF episode began 05/27/20 @ 1145. Patient declined [place] in past . Known AF history, assess for s/sx and have patient send remote transmission to assess current EGM.

## 2020-06-17 NOTE — Progress Notes (Signed)
Remote ICD transmission.   

## 2020-06-22 NOTE — Telephone Encounter (Signed)
Patient agreed to send transmission tomorrow 06/23/2020

## 2020-06-27 NOTE — Telephone Encounter (Signed)
LMOVM for patient to call the device clinic. 

## 2020-07-04 NOTE — Telephone Encounter (Signed)
Dr. Caryl Comes has reviewed and no changes per Epic procedure tab.

## 2020-09-07 ENCOUNTER — Ambulatory Visit (INDEPENDENT_AMBULATORY_CARE_PROVIDER_SITE_OTHER): Payer: BC Managed Care – PPO

## 2020-09-07 DIAGNOSIS — I442 Atrioventricular block, complete: Secondary | ICD-10-CM

## 2020-09-08 LAB — CUP PACEART REMOTE DEVICE CHECK
Battery Remaining Longevity: 66 mo
Battery Voltage: 2.98 V
Brady Statistic RA Percent Paced: 0.03 %
Brady Statistic RV Percent Paced: 97.96 %
Date Time Interrogation Session: 20220223022825
HighPow Impedance: 74 Ohm
Implantable Lead Implant Date: 20200224
Implantable Lead Implant Date: 20200224
Implantable Lead Implant Date: 20200224
Implantable Lead Location: 753858
Implantable Lead Location: 753859
Implantable Lead Location: 753860
Implantable Lead Model: 5076
Implantable Lead Model: 6935
Implantable Pulse Generator Implant Date: 20200224
Lead Channel Impedance Value: 235.98 Ohm
Lead Channel Impedance Value: 239.922
Lead Channel Impedance Value: 239.922
Lead Channel Impedance Value: 261.164
Lead Channel Impedance Value: 261.164
Lead Channel Impedance Value: 304 Ohm
Lead Channel Impedance Value: 399 Ohm
Lead Channel Impedance Value: 437 Ohm
Lead Channel Impedance Value: 437 Ohm
Lead Channel Impedance Value: 513 Ohm
Lead Channel Impedance Value: 532 Ohm
Lead Channel Impedance Value: 532 Ohm
Lead Channel Impedance Value: 855 Ohm
Lead Channel Impedance Value: 855 Ohm
Lead Channel Impedance Value: 855 Ohm
Lead Channel Impedance Value: 912 Ohm
Lead Channel Impedance Value: 950 Ohm
Lead Channel Impedance Value: 950 Ohm
Lead Channel Pacing Threshold Amplitude: 0.75 V
Lead Channel Pacing Threshold Amplitude: 0.875 V
Lead Channel Pacing Threshold Pulse Width: 0.4 ms
Lead Channel Pacing Threshold Pulse Width: 0.4 ms
Lead Channel Sensing Intrinsic Amplitude: 1 mV
Lead Channel Sensing Intrinsic Amplitude: 1 mV
Lead Channel Sensing Intrinsic Amplitude: 12.875 mV
Lead Channel Sensing Intrinsic Amplitude: 12.875 mV
Lead Channel Setting Pacing Amplitude: 1.75 V
Lead Channel Setting Pacing Amplitude: 2.5 V
Lead Channel Setting Pacing Amplitude: 2.5 V
Lead Channel Setting Pacing Pulse Width: 0.4 ms
Lead Channel Setting Pacing Pulse Width: 0.6 ms
Lead Channel Setting Sensing Sensitivity: 0.3 mV

## 2020-09-15 NOTE — Progress Notes (Signed)
Remote ICD transmission.   

## 2020-12-07 ENCOUNTER — Ambulatory Visit (INDEPENDENT_AMBULATORY_CARE_PROVIDER_SITE_OTHER): Payer: BC Managed Care – PPO

## 2020-12-07 DIAGNOSIS — Z95810 Presence of automatic (implantable) cardiac defibrillator: Secondary | ICD-10-CM

## 2020-12-07 DIAGNOSIS — I442 Atrioventricular block, complete: Secondary | ICD-10-CM

## 2020-12-08 LAB — CUP PACEART REMOTE DEVICE CHECK
Battery Remaining Longevity: 60 mo
Battery Voltage: 2.97 V
Brady Statistic RA Percent Paced: 0.02 %
Brady Statistic RV Percent Paced: 98.74 %
Date Time Interrogation Session: 20220525012403
HighPow Impedance: 67 Ohm
Implantable Lead Implant Date: 20200224
Implantable Lead Implant Date: 20200224
Implantable Lead Implant Date: 20200224
Implantable Lead Location: 753858
Implantable Lead Location: 753859
Implantable Lead Location: 753860
Implantable Lead Model: 5076
Implantable Lead Model: 6935
Implantable Pulse Generator Implant Date: 20200224
Lead Channel Impedance Value: 241.412
Lead Channel Impedance Value: 241.412
Lead Channel Impedance Value: 253.333
Lead Channel Impedance Value: 256.5 Ohm
Lead Channel Impedance Value: 270 Ohm
Lead Channel Impedance Value: 304 Ohm
Lead Channel Impedance Value: 380 Ohm
Lead Channel Impedance Value: 456 Ohm
Lead Channel Impedance Value: 456 Ohm
Lead Channel Impedance Value: 513 Ohm
Lead Channel Impedance Value: 513 Ohm
Lead Channel Impedance Value: 570 Ohm
Lead Channel Impedance Value: 836 Ohm
Lead Channel Impedance Value: 855 Ohm
Lead Channel Impedance Value: 912 Ohm
Lead Channel Impedance Value: 950 Ohm
Lead Channel Impedance Value: 969 Ohm
Lead Channel Impedance Value: 988 Ohm
Lead Channel Pacing Threshold Amplitude: 0.625 V
Lead Channel Pacing Threshold Amplitude: 0.875 V
Lead Channel Pacing Threshold Pulse Width: 0.4 ms
Lead Channel Pacing Threshold Pulse Width: 0.4 ms
Lead Channel Sensing Intrinsic Amplitude: 1 mV
Lead Channel Sensing Intrinsic Amplitude: 1 mV
Lead Channel Sensing Intrinsic Amplitude: 13.25 mV
Lead Channel Sensing Intrinsic Amplitude: 13.25 mV
Lead Channel Setting Pacing Amplitude: 1.75 V
Lead Channel Setting Pacing Amplitude: 2.5 V
Lead Channel Setting Pacing Amplitude: 2.5 V
Lead Channel Setting Pacing Pulse Width: 0.4 ms
Lead Channel Setting Pacing Pulse Width: 0.6 ms
Lead Channel Setting Sensing Sensitivity: 0.3 mV

## 2020-12-29 NOTE — Progress Notes (Signed)
Remote ICD transmission.   

## 2021-03-08 ENCOUNTER — Ambulatory Visit (INDEPENDENT_AMBULATORY_CARE_PROVIDER_SITE_OTHER): Payer: BC Managed Care – PPO

## 2021-03-08 DIAGNOSIS — I442 Atrioventricular block, complete: Secondary | ICD-10-CM

## 2021-03-08 LAB — CUP PACEART REMOTE DEVICE CHECK
Battery Remaining Longevity: 53 mo
Battery Voltage: 2.97 V
Brady Statistic AP VP Percent: 28.51 %
Brady Statistic AP VS Percent: 0.01 %
Brady Statistic AS VP Percent: 70.56 %
Brady Statistic AS VS Percent: 0.91 %
Brady Statistic RA Percent Paced: 23.33 %
Brady Statistic RV Percent Paced: 98.8 %
Date Time Interrogation Session: 20220824001803
HighPow Impedance: 73 Ohm
Implantable Lead Implant Date: 20200224
Implantable Lead Implant Date: 20200224
Implantable Lead Implant Date: 20200224
Implantable Lead Location: 753858
Implantable Lead Location: 753859
Implantable Lead Location: 753860
Implantable Lead Model: 5076
Implantable Lead Model: 6935
Implantable Pulse Generator Implant Date: 20200224
Lead Channel Impedance Value: 239.922
Lead Channel Impedance Value: 239.922
Lead Channel Impedance Value: 247.358
Lead Channel Impedance Value: 266 Ohm
Lead Channel Impedance Value: 275.172
Lead Channel Impedance Value: 323 Ohm
Lead Channel Impedance Value: 399 Ohm
Lead Channel Impedance Value: 437 Ohm
Lead Channel Impedance Value: 456 Ohm
Lead Channel Impedance Value: 532 Ohm
Lead Channel Impedance Value: 532 Ohm
Lead Channel Impedance Value: 570 Ohm
Lead Channel Impedance Value: 836 Ohm
Lead Channel Impedance Value: 893 Ohm
Lead Channel Impedance Value: 893 Ohm
Lead Channel Impedance Value: 969 Ohm
Lead Channel Impedance Value: 969 Ohm
Lead Channel Impedance Value: 969 Ohm
Lead Channel Pacing Threshold Amplitude: 0.75 V
Lead Channel Pacing Threshold Amplitude: 0.875 V
Lead Channel Pacing Threshold Pulse Width: 0.4 ms
Lead Channel Pacing Threshold Pulse Width: 0.4 ms
Lead Channel Sensing Intrinsic Amplitude: 1 mV
Lead Channel Sensing Intrinsic Amplitude: 1 mV
Lead Channel Sensing Intrinsic Amplitude: 12.5 mV
Lead Channel Sensing Intrinsic Amplitude: 12.5 mV
Lead Channel Setting Pacing Amplitude: 1.75 V
Lead Channel Setting Pacing Amplitude: 2.5 V
Lead Channel Setting Pacing Amplitude: 2.5 V
Lead Channel Setting Pacing Pulse Width: 0.4 ms
Lead Channel Setting Pacing Pulse Width: 0.6 ms
Lead Channel Setting Sensing Sensitivity: 0.3 mV

## 2021-03-23 NOTE — Progress Notes (Signed)
Remote ICD transmission.   

## 2021-06-07 ENCOUNTER — Ambulatory Visit (INDEPENDENT_AMBULATORY_CARE_PROVIDER_SITE_OTHER): Payer: BC Managed Care – PPO

## 2021-06-07 DIAGNOSIS — I442 Atrioventricular block, complete: Secondary | ICD-10-CM

## 2021-06-07 LAB — CUP PACEART REMOTE DEVICE CHECK
Battery Remaining Longevity: 46 mo
Battery Voltage: 2.96 V
Brady Statistic AP VP Percent: 96.52 %
Brady Statistic AP VS Percent: 0.05 %
Brady Statistic AS VP Percent: 3.34 %
Brady Statistic AS VS Percent: 0.08 %
Brady Statistic RA Percent Paced: 96.11 %
Brady Statistic RV Percent Paced: 99.05 %
Date Time Interrogation Session: 20221123022723
HighPow Impedance: 79 Ohm
Implantable Lead Implant Date: 20200224
Implantable Lead Implant Date: 20200224
Implantable Lead Implant Date: 20200224
Implantable Lead Location: 753858
Implantable Lead Location: 753859
Implantable Lead Location: 753860
Implantable Lead Model: 5076
Implantable Lead Model: 6935
Implantable Pulse Generator Implant Date: 20200224
Lead Channel Impedance Value: 1045 Ohm
Lead Channel Impedance Value: 245.538
Lead Channel Impedance Value: 257.018
Lead Channel Impedance Value: 257.018
Lead Channel Impedance Value: 279.525
Lead Channel Impedance Value: 279.525
Lead Channel Impedance Value: 323 Ohm
Lead Channel Impedance Value: 399 Ohm
Lead Channel Impedance Value: 456 Ohm
Lead Channel Impedance Value: 494 Ohm
Lead Channel Impedance Value: 532 Ohm
Lead Channel Impedance Value: 589 Ohm
Lead Channel Impedance Value: 589 Ohm
Lead Channel Impedance Value: 893 Ohm
Lead Channel Impedance Value: 912 Ohm
Lead Channel Impedance Value: 950 Ohm
Lead Channel Impedance Value: 969 Ohm
Lead Channel Impedance Value: 988 Ohm
Lead Channel Pacing Threshold Amplitude: 0.75 V
Lead Channel Pacing Threshold Amplitude: 0.875 V
Lead Channel Pacing Threshold Pulse Width: 0.4 ms
Lead Channel Pacing Threshold Pulse Width: 0.4 ms
Lead Channel Sensing Intrinsic Amplitude: 13.125 mV
Lead Channel Sensing Intrinsic Amplitude: 13.125 mV
Lead Channel Sensing Intrinsic Amplitude: 2.5 mV
Lead Channel Sensing Intrinsic Amplitude: 2.5 mV
Lead Channel Setting Pacing Amplitude: 1.75 V
Lead Channel Setting Pacing Amplitude: 2.5 V
Lead Channel Setting Pacing Amplitude: 2.5 V
Lead Channel Setting Pacing Pulse Width: 0.4 ms
Lead Channel Setting Pacing Pulse Width: 0.6 ms
Lead Channel Setting Sensing Sensitivity: 0.3 mV

## 2021-06-19 NOTE — Progress Notes (Signed)
Remote ICD transmission.   

## 2021-06-28 ENCOUNTER — Ambulatory Visit: Payer: BC Managed Care – PPO | Admitting: Internal Medicine

## 2021-06-28 ENCOUNTER — Encounter: Payer: Self-pay | Admitting: Internal Medicine

## 2021-06-28 ENCOUNTER — Other Ambulatory Visit: Payer: Self-pay

## 2021-06-28 VITALS — BP 128/80 | HR 74 | Ht 79.0 in | Wt 250.0 lb

## 2021-06-28 DIAGNOSIS — Z95810 Presence of automatic (implantable) cardiac defibrillator: Secondary | ICD-10-CM

## 2021-06-28 DIAGNOSIS — I429 Cardiomyopathy, unspecified: Secondary | ICD-10-CM

## 2021-06-28 DIAGNOSIS — I442 Atrioventricular block, complete: Secondary | ICD-10-CM | POA: Diagnosis not present

## 2021-06-28 DIAGNOSIS — I5022 Chronic systolic (congestive) heart failure: Secondary | ICD-10-CM

## 2021-06-28 DIAGNOSIS — D8685 Sarcoid myocarditis: Secondary | ICD-10-CM

## 2021-06-28 DIAGNOSIS — I4892 Unspecified atrial flutter: Secondary | ICD-10-CM

## 2021-06-28 NOTE — Progress Notes (Signed)
Patient Care Team: Patient, No Pcp Per (Inactive) as PCP - General (General Practice) Nahser, Wonda Cheng, MD as PCP - Cardiology (Cardiology) Thompson Grayer, MD as Attending Physician (Cardiology)   HPI  Ricardo Jones is a 50 y.o. 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 High resolution CT scan unrevealing of sarcoid.  Progressive symptomatic bradycardia with his complete heart block received CRT -D 2/20.  Declined further evaluation at [facility]  His mother genetic testing for LMN has come back positive.  He has not yet been tested. In this regard it is worth noting that LMN is a phenocopy on imaging for sarcoid  He has had atrial flutter with a gradually slowing cycle length;  He ended up spontaneously converting to sinus rhythm (presumed given the progressively slowing atrial cycle lengths [250--600 ms]  associated with his flutter noted from 2013--19) in October with reversion about 2 weeks ago back to atrial flutter now with an atrial cycle length of about 240 ms.      The patient denies chest pain, shortness of breath, nocturnal dyspnea, orthopnea or peripheral edema.  There have been no palpitations, lightheadedness or syncope. Complains of being winded by "weird things". For example, he can get winded when going up flights of stairs, and can tell that the accelerometer pacemaker has not kicked in yet.  He was significantly short of breath having to stop a number of times climbing up the [facility]      He states that his energy levels have been fine. He states that currently he has some sickness due to having elementary school children.  DATE TEST EF    7/15 Echo   50-55 % Severe RAE/LAE  5/18 Echo  40-45 % Mild RAE/LAE  10/19 Echo  40-45%    11/19 cMRI   Severe BAE DGE+>Sarcoid  12/19 cPET   + Consistent Sarcoid  8/20 Echo   50-55%            Date Cr K Hgb  6/20 1.01 4.7 14.3 (2/20)           Thromboembolic risk factors (CHF-1) for a CHADSVASc Score of sort of 1      Records and Results Reviewed*   Past Medical History:  Diagnosis Date   Bradycardia    asymptomatic   S/P dilatation of esophageal stricture    Typical atrial flutter [facility])     Past Surgical History:  Procedure Laterality Date   BIV ICD INSERTION CRT-D N/A 09/08/2018   Procedure: BIV ICD INSERTION CRT-D;  Surgeon: Evans Lance, MD;  Location: [facility];  Service: Cardiovascular;  Laterality: N/A;   THROAT SURGERY  2012    Current Meds  Medication Sig   PREVIDENT 5000 BOOSTER PLUS 1.1 % PSTE as directed.    No Known Allergies    Review of Systems negative except from HPI and PMH  Physical Exam BP 128/80    Pulse 74    Ht 6\' 7"  (2.007 m)    Wt 250 lb (113.4 kg)    SpO2 97%    BMI 28.16 kg/m  Well developed and well nourished in no acute distress HENT normal Neck supple with JVP-flat Clear Device pocket well healed; without hematoma or erythema.  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  CHADS-VASc score remains 1 or so (related to LV dysfunction).  No anticoagulation is indicated at this time.  No associated symptoms. Sinus node dysfunction with relative chronotropic incompetence I suspect contributes to his dyspnea.  We will reprogram his rate program to increase his upper sensor rate as well  as to increase his exercise slope.  We will leave his ADL slope as it is for now.  Reviewed device physiology      Recurrence of atrial arrhythmias.  This is a fibrillation with variable cycle length.  There is increasingly little atrial arrhythmia compared to before.  Perhaps there is ongoing atrial healing.  Long discussion regarding the role of ablation.  Reviewed the data regarding cabana and then the meta-analysis which suggested that there was hard endpoint benefit to ablation and comparing it with the general direction that ablation should be undertaken for symptom relief.  In the absence of symptoms, we will follow.  We discussed potential remediable risks contributing to atrial fibrillation.  We recommended home sleep study.  He will work on weight loss.  We will plan to see him in a years time.  We will continue to consider whether to pursue genetic testing of the lamin defect found in his mom   Current medicines are reviewed at length with the patient today .  The patient does not  have concerns regarding medicines.   I,Jordan Kelly,acting as a Education administrator for Virl Axe, MD.,have documented all relevant documentation on the behalf of Virl Axe, MD,as directed by  Virl Axe, MD while in the presence of Virl Axe, MD. I, Virl Axe, MD, have reviewed all documentation for this visit. The documentation on 06/28/21 for the exam, diagnosis, procedures, and orders are all accurate and complete.

## 2021-06-28 NOTE — Progress Notes (Deleted)
Patient Care Team: Patient, No Pcp Per (Inactive) as PCP - General (General Practice) Nahser, Wonda Cheng, MD as PCP - Cardiology (Cardiology) Thompson Grayer, MD as Attending Physician (Cardiology)   HPI  Ricardo Jones is a 50 y.o. 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 High resolution CT scan unrevealing of sarcoid.  Progressive symptomatic bradycardia with his complete heart block received CRT -D 2/20.  Declined further evaluation at [facility]  His mother genetic testing for LMN has come back positive.  He has not yet been tested. In this regard it is worth noting that LMN is a phenocopy on imaging for sarcoid  He has had atrial flutter with a gradually slowing cycle length;  He ended up spontaneously converting to sinus rhythm (presumed given the progressively slowing atrial cycle lengths [250--600 ms]  associated with his flutter noted from 2013--19) in October with reversion about 2 weeks ago back to atrial flutter now with an atrial cycle length of about 240 ms.      The patient denies chest pain***, shortness of breath***, nocturnal dyspnea***, orthopnea*** or peripheral edema***.  There have been no palpitations***, lightheadedness*** or syncope***.  Complains of ***.   DATE TEST EF    7/15 Echo   50-55 % Severe RAE/LAE  5/18 Echo  40-45 % Mild RAE/LAE  10/19 Echo  40-45%    11/19 cMRI   Severe BAE DGE+>Sarcoid  12/19 cPET   + Consistent Sarcoid  8/20 Echo  50-55%            Date Cr K Hgb  6/20 1.01 4.7 14.3 (2/20)           Thromboembolic risk factors (CHF-1) for a CHADSVASc Score of sort of 1      Records and Results Reviewed*   Past Medical History:  Diagnosis Date   Bradycardia    asymptomatic   S/P dilatation of esophageal stricture    Typical atrial flutter [facility])     Past Surgical History:  Procedure Laterality  Date   BIV ICD INSERTION CRT-D N/A 09/08/2018   Procedure: BIV ICD INSERTION CRT-D;  Surgeon: Evans Lance, MD;  Location: [facility];  Service: Cardiovascular;  Laterality: N/A;   THROAT SURGERY  2012    No outpatient medications have been marked as taking for the 06/28/21 encounter (Appointment) with Deboraha Sprang, MD.    No Known Allergies    Review of Systems negative except from HPI and PMH  Physical Exam There were no vitals taken for this visit. Well developed and well nourished in no acute distress HENT normal Neck supple with JVP-flat Clear Device pocket well healed; without hematoma or erythema.  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  This is a fibrillation with variable cycle length.  There is increasingly little atrial arrhythmia compared to before.  Perhaps there is ongoing atrial healing.  Long discussion regarding the role of ablation.  Reviewed the data regarding cabana and then the meta-analysis which suggested that there was hard endpoint benefit to ablation and comparing it with the general direction that ablation should be undertaken for symptom relief.  In the absence of symptoms, we will follow.  We discussed potential remediable risks contributing to atrial fibrillation.  We recommended home sleep study.  He will work on weight loss.  We will plan to see him in a years time.  We will continue to consider whether to pursue genetic testing of the lamin defect  found in his mom   Current medicines are reviewed at length with the patient today .  The patient does not  have concerns regarding medicines.

## 2021-06-28 NOTE — Patient Instructions (Addendum)
Medication Instructions:  Your physician recommends that you continue on your current medications as directed. Please refer to the Current Medication list given to you today.  *If you need a refill on your cardiac medications before your next appointment, please call your pharmacy*   Lab Work: None ordered.  If you have labs (blood work) drawn today and your tests are completely normal, you will receive your results only by: [place] (if you have MyChart) OR A paper copy in the mail If you have any lab test that is abnormal or we need to change your treatment, we will call you to review the results.   Testing/Procedures: Your physician has requested that you have an echocardiogram. Echocardiography is a painless test that uses sound waves to create images of your heart. It provides your doctor with information about the size and shape of your heart and how well your hearts chambers and valves are working. This procedure takes approximately one hour. There are no restrictions for this procedure.    Follow-Up: At [facility], you and your health needs are our priority.  As part of our continuing mission to provide you with exceptional heart care, we have created designated Provider Care Teams.  These Care Teams include your primary Cardiologist (physician) and Advanced Practice Providers (APPs -  Physician Assistants and Nurse Practitioners) who all work together to provide you with the care you need, when you need it.  We recommend signing up for the patient portal called "MyChart".  Sign up information is provided on this After Visit Summary.  MyChart is used to connect with patients for Virtual Visits (Telemedicine).  Patients are able to view lab/test results, encounter notes, upcoming appointments, etc.  Non-urgent messages can be sent to your provider as well.   To learn more about what you can do with MyChart, go to [email or web address].    Your next appointment:   6  months with Dr Klein:1}   [facility]  [phone]

## 2021-07-05 NOTE — Addendum Note (Signed)
Addended by: Drue Novel I on: 07/05/2021 03:43 PM   Modules accepted: Orders

## 2021-07-20 ENCOUNTER — Encounter: Payer: Self-pay | Admitting: Internal Medicine

## 2021-08-03 ENCOUNTER — Other Ambulatory Visit: Payer: Self-pay

## 2021-08-03 ENCOUNTER — Ambulatory Visit (INDEPENDENT_AMBULATORY_CARE_PROVIDER_SITE_OTHER): Payer: BC Managed Care – PPO

## 2021-08-03 DIAGNOSIS — D8685 Sarcoid myocarditis: Secondary | ICD-10-CM

## 2021-08-03 DIAGNOSIS — I5022 Chronic systolic (congestive) heart failure: Secondary | ICD-10-CM

## 2021-08-03 DIAGNOSIS — I429 Cardiomyopathy, unspecified: Secondary | ICD-10-CM

## 2021-08-03 LAB — ECHOCARDIOGRAM COMPLETE
AR max vel: 2.58 cm2
AV Area VTI: 2.48 cm2
AV Area mean vel: 2.3 cm2
AV Mean grad: 4 mmHg
AV Peak grad: 6.1 mmHg
Ao pk vel: 1.23 m/s
Area-P 1/2: 3.65 cm2
Calc EF: 44.2 %
S' Lateral: 4.4 cm
Single Plane A2C EF: 40.8 %
Single Plane A4C EF: 48.5 %

## 2021-09-06 ENCOUNTER — Ambulatory Visit (INDEPENDENT_AMBULATORY_CARE_PROVIDER_SITE_OTHER): Payer: BC Managed Care – PPO

## 2021-09-06 DIAGNOSIS — I442 Atrioventricular block, complete: Secondary | ICD-10-CM

## 2021-09-06 LAB — CUP PACEART REMOTE DEVICE CHECK
Battery Remaining Longevity: 43 mo
Battery Voltage: 2.96 V
Brady Statistic AP VP Percent: 97.04 %
Brady Statistic AP VS Percent: 0.02 %
Brady Statistic AS VP Percent: 2.87 %
Brady Statistic AS VS Percent: 0.07 %
Brady Statistic RA Percent Paced: 95.77 %
Brady Statistic RV Percent Paced: 99.16 %
Date Time Interrogation Session: 20230222001804
HighPow Impedance: 70 Ohm
Implantable Lead Implant Date: 20200224
Implantable Lead Implant Date: 20200224
Implantable Lead Implant Date: 20200224
Implantable Lead Location: 753858
Implantable Lead Location: 753859
Implantable Lead Location: 753860
Implantable Lead Model: 5076
Implantable Lead Model: 6935
Implantable Pulse Generator Implant Date: 20200224
Lead Channel Impedance Value: 235.98 Ohm
Lead Channel Impedance Value: 235.98 Ohm
Lead Channel Impedance Value: 239.922
Lead Channel Impedance Value: 256.5 Ohm
Lead Channel Impedance Value: 261.164
Lead Channel Impedance Value: 304 Ohm
Lead Channel Impedance Value: 380 Ohm
Lead Channel Impedance Value: 437 Ohm
Lead Channel Impedance Value: 437 Ohm
Lead Channel Impedance Value: 513 Ohm
Lead Channel Impedance Value: 513 Ohm
Lead Channel Impedance Value: 532 Ohm
Lead Channel Impedance Value: 779 Ohm
Lead Channel Impedance Value: 836 Ohm
Lead Channel Impedance Value: 836 Ohm
Lead Channel Impedance Value: 912 Ohm
Lead Channel Impedance Value: 912 Ohm
Lead Channel Impedance Value: 950 Ohm
Lead Channel Pacing Threshold Amplitude: 0.75 V
Lead Channel Pacing Threshold Amplitude: 0.875 V
Lead Channel Pacing Threshold Pulse Width: 0.4 ms
Lead Channel Pacing Threshold Pulse Width: 0.4 ms
Lead Channel Sensing Intrinsic Amplitude: 2 mV
Lead Channel Sensing Intrinsic Amplitude: 2 mV
Lead Channel Sensing Intrinsic Amplitude: 4.5 mV
Lead Channel Sensing Intrinsic Amplitude: 4.5 mV
Lead Channel Setting Pacing Amplitude: 1.75 V
Lead Channel Setting Pacing Amplitude: 2.5 V
Lead Channel Setting Pacing Amplitude: 2.5 V
Lead Channel Setting Pacing Pulse Width: 0.4 ms
Lead Channel Setting Pacing Pulse Width: 0.6 ms
Lead Channel Setting Sensing Sensitivity: 0.3 mV

## 2021-09-13 NOTE — Progress Notes (Signed)
Remote ICD transmission.   

## 2021-09-25 ENCOUNTER — Emergency Department (HOSPITAL_COMMUNITY): Payer: BC Managed Care – PPO

## 2021-09-25 ENCOUNTER — Inpatient Hospital Stay (HOSPITAL_COMMUNITY): Payer: BC Managed Care – PPO

## 2021-09-25 ENCOUNTER — Inpatient Hospital Stay (HOSPITAL_COMMUNITY): Payer: BC Managed Care – PPO | Admitting: Certified Registered Nurse Anesthetist

## 2021-09-25 ENCOUNTER — Inpatient Hospital Stay (HOSPITAL_COMMUNITY)
Admission: EM | Admit: 2021-09-25 | Discharge: 2021-09-28 | DRG: 023 | Disposition: A | Discharge status: Home / Self Care | Payer: BC Managed Care – PPO | Attending: Neurology | Admitting: Neurology

## 2021-09-25 ENCOUNTER — Encounter (HOSPITAL_COMMUNITY)
Admission: EM | Disposition: A | Discharge status: Home / Self Care | Payer: Self-pay | Source: Home / Self Care | Attending: Neurology

## 2021-09-25 DIAGNOSIS — Z20822 Contact with and (suspected) exposure to covid-19: Secondary | ICD-10-CM | POA: Diagnosis present

## 2021-09-25 DIAGNOSIS — E785 Hyperlipidemia, unspecified: Secondary | ICD-10-CM | POA: Diagnosis present

## 2021-09-25 DIAGNOSIS — R4701 Aphasia: Secondary | ICD-10-CM | POA: Diagnosis present

## 2021-09-25 DIAGNOSIS — I63411 Cerebral infarction due to embolism of right middle cerebral artery: Secondary | ICD-10-CM | POA: Diagnosis not present

## 2021-09-25 DIAGNOSIS — G510 Bell's palsy: Secondary | ICD-10-CM | POA: Diagnosis present

## 2021-09-25 DIAGNOSIS — I5022 Chronic systolic (congestive) heart failure: Secondary | ICD-10-CM | POA: Diagnosis not present

## 2021-09-25 DIAGNOSIS — I5023 Acute on chronic systolic (congestive) heart failure: Secondary | ICD-10-CM | POA: Diagnosis present

## 2021-09-25 DIAGNOSIS — R29726 NIHSS score 26: Secondary | ICD-10-CM | POA: Diagnosis present

## 2021-09-25 DIAGNOSIS — I493 Ventricular premature depolarization: Secondary | ICD-10-CM | POA: Diagnosis not present

## 2021-09-25 DIAGNOSIS — D638 Anemia in other chronic diseases classified elsewhere: Secondary | ICD-10-CM | POA: Diagnosis present

## 2021-09-25 DIAGNOSIS — G8194 Hemiplegia, unspecified affecting left nondominant side: Secondary | ICD-10-CM | POA: Diagnosis present

## 2021-09-25 DIAGNOSIS — R339 Retention of urine, unspecified: Secondary | ICD-10-CM | POA: Diagnosis not present

## 2021-09-25 DIAGNOSIS — R0689 Other abnormalities of breathing: Secondary | ICD-10-CM | POA: Diagnosis not present

## 2021-09-25 DIAGNOSIS — R1310 Dysphagia, unspecified: Secondary | ICD-10-CM | POA: Diagnosis present

## 2021-09-25 DIAGNOSIS — I6389 Other cerebral infarction: Secondary | ICD-10-CM | POA: Diagnosis not present

## 2021-09-25 DIAGNOSIS — E876 Hypokalemia: Secondary | ICD-10-CM | POA: Diagnosis present

## 2021-09-25 DIAGNOSIS — D8689 Sarcoidosis of other sites: Secondary | ICD-10-CM | POA: Diagnosis present

## 2021-09-25 DIAGNOSIS — J9589 Other postprocedural complications and disorders of respiratory system, not elsewhere classified: Secondary | ICD-10-CM

## 2021-09-25 DIAGNOSIS — I4729 Other ventricular tachycardia: Secondary | ICD-10-CM | POA: Diagnosis not present

## 2021-09-25 DIAGNOSIS — I442 Atrioventricular block, complete: Secondary | ICD-10-CM | POA: Diagnosis present

## 2021-09-25 DIAGNOSIS — D6489 Other specified anemias: Secondary | ICD-10-CM | POA: Diagnosis present

## 2021-09-25 DIAGNOSIS — I472 Ventricular tachycardia, unspecified: Secondary | ICD-10-CM | POA: Diagnosis not present

## 2021-09-25 DIAGNOSIS — T886XXA Anaphylactic reaction due to adverse effect of correct drug or medicament properly administered, initial encounter: Secondary | ICD-10-CM | POA: Diagnosis not present

## 2021-09-25 DIAGNOSIS — H510 Palsy (spasm) of conjugate gaze: Secondary | ICD-10-CM | POA: Diagnosis present

## 2021-09-25 DIAGNOSIS — I480 Paroxysmal atrial fibrillation: Secondary | ICD-10-CM | POA: Diagnosis present

## 2021-09-25 DIAGNOSIS — I639 Cerebral infarction, unspecified: Secondary | ICD-10-CM | POA: Diagnosis present

## 2021-09-25 DIAGNOSIS — I63511 Cerebral infarction due to unspecified occlusion or stenosis of right middle cerebral artery: Principal | ICD-10-CM | POA: Diagnosis present

## 2021-09-25 DIAGNOSIS — D696 Thrombocytopenia, unspecified: Secondary | ICD-10-CM | POA: Diagnosis present

## 2021-09-25 DIAGNOSIS — I255 Ischemic cardiomyopathy: Secondary | ICD-10-CM | POA: Diagnosis not present

## 2021-09-25 DIAGNOSIS — I10 Essential (primary) hypertension: Secondary | ICD-10-CM | POA: Diagnosis present

## 2021-09-25 DIAGNOSIS — I420 Dilated cardiomyopathy: Secondary | ICD-10-CM | POA: Diagnosis present

## 2021-09-25 DIAGNOSIS — Z9282 Status post administration of tPA (rtPA) in a different facility within the last 24 hours prior to admission to current facility: Secondary | ICD-10-CM | POA: Diagnosis not present

## 2021-09-25 DIAGNOSIS — Z8249 Family history of ischemic heart disease and other diseases of the circulatory system: Secondary | ICD-10-CM

## 2021-09-25 DIAGNOSIS — I483 Typical atrial flutter: Secondary | ICD-10-CM | POA: Diagnosis present

## 2021-09-25 DIAGNOSIS — Z95810 Presence of automatic (implantable) cardiac defibrillator: Secondary | ICD-10-CM

## 2021-09-25 DIAGNOSIS — I428 Other cardiomyopathies: Secondary | ICD-10-CM | POA: Diagnosis not present

## 2021-09-25 DIAGNOSIS — I110 Hypertensive heart disease with heart failure: Secondary | ICD-10-CM | POA: Diagnosis present

## 2021-09-25 DIAGNOSIS — Z9289 Personal history of other medical treatment: Secondary | ICD-10-CM

## 2021-09-25 DIAGNOSIS — I6601 Occlusion and stenosis of right middle cerebral artery: Secondary | ICD-10-CM

## 2021-09-25 DIAGNOSIS — T4275XA Adverse effect of unspecified antiepileptic and sedative-hypnotic drugs, initial encounter: Secondary | ICD-10-CM | POA: Diagnosis not present

## 2021-09-25 DIAGNOSIS — E7800 Pure hypercholesterolemia, unspecified: Secondary | ICD-10-CM | POA: Diagnosis not present

## 2021-09-25 DIAGNOSIS — I4892 Unspecified atrial flutter: Secondary | ICD-10-CM | POA: Diagnosis not present

## 2021-09-25 DIAGNOSIS — I63311 Cerebral infarction due to thrombosis of right middle cerebral artery: Principal | ICD-10-CM | POA: Diagnosis present

## 2021-09-25 HISTORY — DX: Cerebral infarction due to unspecified occlusion or stenosis of right middle cerebral artery: I63.511

## 2021-09-25 LAB — RESP PANEL BY RT-PCR (FLU A&B, COVID) ARPGX2
Influenza A by PCR: NEGATIVE
Influenza B by PCR: NEGATIVE
SARS Coronavirus 2 by RT PCR: NEGATIVE

## 2021-09-25 LAB — COMPREHENSIVE METABOLIC PANEL
ALT: 25 U/L (ref 0–44)
AST: 28 U/L (ref 15–41)
Albumin: 3.9 g/dL (ref 3.5–5.0)
Alkaline Phosphatase: 72 U/L (ref 38–126)
Anion gap: 11 (ref 5–15)
BUN: 20 mg/dL (ref 6–20)
CO2: 25 mmol/L (ref 22–32)
Calcium: 9.2 mg/dL (ref 8.9–10.3)
Chloride: 104 mmol/L (ref 98–111)
Creatinine, Ser: 1.11 mg/dL (ref 0.61–1.24)
GFR, Estimated: >60 mL/min (ref >60)
Glucose, Bld: 102 mg/dL — ABNORMAL HIGH (ref 70–99)
Potassium: 4.3 mmol/L (ref 3.5–5.1)
Sodium: 140 mmol/L (ref 135–145)
Total Bilirubin: 0.9 mg/dL (ref 0.3–1.2)
Total Protein: 6.6 g/dL (ref 6.5–8.1)

## 2021-09-25 LAB — CBG MONITORING, ED: Glucose-Capillary: 90 mg/dL (ref 70–99)

## 2021-09-25 LAB — POCT I-STAT 7, (LYTES, BLD GAS, ICA,H+H)
Acid-Base Excess: 2 mmol/L (ref 0.0–2.0)
Bicarbonate: 26.2 mmol/L (ref 20.0–28.0)
Calcium, Ion: 1.18 mmol/L (ref 1.15–1.40)
HCT: 37 % — ABNORMAL LOW (ref 39.0–52.0)
Hemoglobin: 12.6 g/dL — ABNORMAL LOW (ref 13.0–17.0)
O2 Saturation: 100 %
Patient temperature: 97.6
Potassium: 3.8 mmol/L (ref 3.5–5.1)
Sodium: 140 mmol/L (ref 135–145)
TCO2: 27 mmol/L (ref 22–32)
pCO2 arterial: 37 mmHg (ref 32–48)
pH, Arterial: 7.457 — ABNORMAL HIGH (ref 7.35–7.45)
pO2, Arterial: 509 mmHg — ABNORMAL HIGH (ref 83–108)

## 2021-09-25 LAB — DIFFERENTIAL
Abs Immature Granulocytes: 0.02 10*3/uL (ref 0.00–0.07)
Basophils Absolute: 0 10*3/uL (ref 0.0–0.1)
Basophils Relative: 1 %
Eosinophils Absolute: 0.2 10*3/uL (ref 0.0–0.5)
Eosinophils Relative: 3 %
Immature Granulocytes: 0 %
Lymphocytes Relative: 25 %
Lymphs Abs: 1.7 10*3/uL (ref 0.7–4.0)
Monocytes Absolute: 0.4 10*3/uL (ref 0.1–1.0)
Monocytes Relative: 6 %
Neutro Abs: 4.4 10*3/uL (ref 1.7–7.7)
Neutrophils Relative %: 65 %

## 2021-09-25 LAB — I-STAT CHEM 8, ED
BUN: 24 mg/dL — ABNORMAL HIGH (ref 6–20)
Calcium, Ion: 1.14 mmol/L — ABNORMAL LOW (ref 1.15–1.40)
Chloride: 105 mmol/L (ref 98–111)
Creatinine, Ser: 1.1 mg/dL (ref 0.61–1.24)
Glucose, Bld: 97 mg/dL (ref 70–99)
HCT: 43 % (ref 39.0–52.0)
Hemoglobin: 14.6 g/dL (ref 13.0–17.0)
Potassium: 4.4 mmol/L (ref 3.5–5.1)
Sodium: 141 mmol/L (ref 135–145)
TCO2: 27 mmol/L (ref 22–32)

## 2021-09-25 LAB — ETHANOL: Alcohol, Ethyl (B): <10 mg/dL (ref <10)

## 2021-09-25 LAB — CBC
HCT: 44.1 % (ref 39.0–52.0)
Hemoglobin: 14.4 g/dL (ref 13.0–17.0)
MCH: 29.5 pg (ref 26.0–34.0)
MCHC: 32.7 g/dL (ref 30.0–36.0)
MCV: 90.4 fL (ref 80.0–100.0)
Platelets: 159 10*3/uL (ref 150–400)
RBC: 4.88 MIL/uL (ref 4.22–5.81)
RDW: 12.5 % (ref 11.5–15.5)
WBC: 6.8 10*3/uL (ref 4.0–10.5)
nRBC: 0 % (ref 0.0–0.2)

## 2021-09-25 LAB — APTT: aPTT: 33 seconds (ref 24–36)

## 2021-09-25 LAB — GLUCOSE, CAPILLARY: Glucose-Capillary: 88 mg/dL (ref 70–99)

## 2021-09-25 LAB — PROTIME-INR
INR: 1.1 (ref 0.8–1.2)
Prothrombin Time: 13.7 seconds (ref 11.4–15.2)

## 2021-09-25 LAB — HIV ANTIBODY (ROUTINE TESTING W REFLEX): HIV Screen 4th Generation wRfx: NONREACTIVE

## 2021-09-25 SURGERY — IR WITH ANESTHESIA
Anesthesia: General

## 2021-09-25 MED ORDER — LIDOCAINE HCL (CARDIAC) PF 100 MG/5ML IV SOSY
PREFILLED_SYRINGE | INTRAVENOUS | Status: DC | PRN
  Administered 2021-09-25: 100 mg via INTRAVENOUS

## 2021-09-25 MED ORDER — CEFAZOLIN SODIUM-DEXTROSE 2-3 GM-%(50ML) IV SOLR
INTRAVENOUS | Status: DC | PRN
  Administered 2021-09-25: 2 g via INTRAVENOUS

## 2021-09-25 MED ORDER — CEFAZOLIN SODIUM-DEXTROSE 2-4 GM/100ML-% IV SOLN
INTRAVENOUS | Status: AC
Start: 2021-09-25 — End: 2021-09-26
  Filled 2021-09-25: qty 100

## 2021-09-25 MED ORDER — IOHEXOL 300 MG/ML  SOLN: 100.0000 mL | Freq: Once | INTRAMUSCULAR | Status: DC | PRN

## 2021-09-25 MED ORDER — POLYETHYLENE GLYCOL 3350 17 G PO PACK: 17.0000 g | PACK | Freq: Every day | ORAL | Status: DC

## 2021-09-25 MED ORDER — ORAL CARE MOUTH RINSE
15.0000 mL | OROMUCOSAL | Status: DC
  Administered 2021-09-26 (×8): 15 mL via OROMUCOSAL

## 2021-09-25 MED ORDER — LACTATED RINGERS IV SOLN: INTRAVENOUS | Status: DC | PRN

## 2021-09-25 MED ORDER — FENTANYL CITRATE (PF) 100 MCG/2ML IJ SOLN
INTRAMUSCULAR | Status: DC | PRN
  Administered 2021-09-25 (×2): 50 ug via INTRAVENOUS

## 2021-09-25 MED ORDER — IOHEXOL 350 MG/ML SOLN
75.0000 mL | Freq: Once | INTRAVENOUS | Status: AC | PRN
  Administered 2021-09-25: 75 mL via INTRAVENOUS

## 2021-09-25 MED ORDER — PROPOFOL 500 MG/50ML IV EMUL
INTRAVENOUS | Status: DC | PRN
  Administered 2021-09-25: 75 ug/kg/min via INTRAVENOUS

## 2021-09-25 MED ORDER — ACETAMINOPHEN 650 MG RE SUPP: 650.0000 mg | RECTAL | Status: DC | PRN

## 2021-09-25 MED ORDER — ROCURONIUM BROMIDE 100 MG/10ML IV SOLN
INTRAVENOUS | Status: DC | PRN
  Administered 2021-09-25: 100 mg via INTRAVENOUS

## 2021-09-25 MED ORDER — PANTOPRAZOLE SODIUM 40 MG IV SOLR
40.0000 mg | Freq: Every day | INTRAVENOUS | Status: DC
  Filled 2021-09-25: qty 10

## 2021-09-25 MED ORDER — LABETALOL HCL 5 MG/ML IV SOLN: 10.0000 mg | Freq: Once | INTRAVENOUS | Status: DC | PRN

## 2021-09-25 MED ORDER — SODIUM CHLORIDE 0.9 % IV SOLN: 50.0000 mL/h | INTRAVENOUS | Status: DC

## 2021-09-25 MED ORDER — ACETAMINOPHEN 160 MG/5ML PO SOLN: 650.0000 mg | ORAL | Status: DC | PRN

## 2021-09-25 MED ORDER — STROKE: EARLY STAGES OF RECOVERY BOOK
Freq: Once | Status: AC
  Filled 2021-09-25: qty 1

## 2021-09-25 MED ORDER — FENTANYL CITRATE PF 50 MCG/ML IJ SOSY: 50.0000 ug | PREFILLED_SYRINGE | INTRAMUSCULAR | Status: DC | PRN

## 2021-09-25 MED ORDER — SUCCINYLCHOLINE CHLORIDE 200 MG/10ML IV SOSY
PREFILLED_SYRINGE | INTRAVENOUS | Status: DC | PRN
  Administered 2021-09-25: 120 mg via INTRAVENOUS

## 2021-09-25 MED ORDER — ACETAMINOPHEN 650 MG RE SUPP
650.0000 mg | RECTAL | Status: DC | PRN
Start: 2021-09-25 — End: 2021-09-28

## 2021-09-25 MED ORDER — DOCUSATE SODIUM 50 MG/5ML PO LIQD: 100.0000 mg | Freq: Two times a day (BID) | ORAL | Status: DC

## 2021-09-25 MED ORDER — NITROGLYCERIN 1 MG/10 ML FOR IR/CATH LAB
INTRA_ARTERIAL | Status: AC
  Administered 2021-09-25: 25 ug via INTRA_ARTERIAL
  Filled 2021-09-25: qty 10

## 2021-09-25 MED ORDER — CLEVIDIPINE BUTYRATE 0.5 MG/ML IV EMUL: 0.0000 mg/h | INTRAVENOUS | Status: DC

## 2021-09-25 MED ORDER — FENTANYL CITRATE (PF) 100 MCG/2ML IJ SOLN
INTRAMUSCULAR | Status: AC
  Filled 2021-09-25: qty 2

## 2021-09-25 MED ORDER — CHLORHEXIDINE GLUCONATE 0.12% ORAL RINSE (MEDLINE KIT)
15.0000 mL | Freq: Two times a day (BID) | OROMUCOSAL | Status: DC
  Administered 2021-09-25 – 2021-09-26 (×2): 15 mL via OROMUCOSAL

## 2021-09-25 MED ORDER — TENECTEPLASE FOR STROKE
25.0000 mg | PACK | Freq: Once | INTRAVENOUS | Status: AC
  Administered 2021-09-25: 25 mg via INTRAVENOUS
  Filled 2021-09-25: qty 10

## 2021-09-25 MED ORDER — PROPOFOL 1000 MG/100ML IV EMUL
5.0000 ug/kg/min | INTRAVENOUS | Status: DC
  Administered 2021-09-25: 55 ug/kg/min via INTRAVENOUS
  Administered 2021-09-26: 60 ug/kg/min via INTRAVENOUS
  Administered 2021-09-26: 55 ug/kg/min via INTRAVENOUS
  Administered 2021-09-26: 60 ug/kg/min via INTRAVENOUS
  Filled 2021-09-25 (×3): qty 100

## 2021-09-25 MED ORDER — SODIUM CHLORIDE 0.9 % IV SOLN: INTRAVENOUS | Status: DC

## 2021-09-25 MED ORDER — CHLORHEXIDINE GLUCONATE CLOTH 2 % EX PADS
6.0000 | MEDICATED_PAD | Freq: Every day | CUTANEOUS | Status: DC
  Administered 2021-09-26 – 2021-09-28 (×2): 6 via TOPICAL

## 2021-09-25 MED ORDER — NICARDIPINE HCL IN NACL 20-0.86 MG/200ML-% IV SOLN: 0.0000 mg/h | INTRAVENOUS | Status: DC | PRN

## 2021-09-25 MED ORDER — CLEVIDIPINE BUTYRATE 0.5 MG/ML IV EMUL
0.0000 mg/h | INTRAVENOUS | Status: DC
  Administered 2021-09-26: 1 mg/h via INTRAVENOUS
  Filled 2021-09-25: qty 50

## 2021-09-25 MED ORDER — ACETAMINOPHEN 325 MG PO TABS: 650.0000 mg | ORAL_TABLET | ORAL | Status: DC | PRN

## 2021-09-25 MED ORDER — PHENYLEPHRINE HCL-NACL 20-0.9 MG/250ML-% IV SOLN
INTRAVENOUS | Status: DC | PRN
  Administered 2021-09-25: 20 ug/min via INTRAVENOUS

## 2021-09-25 MED ORDER — PROPOFOL 10 MG/ML IV BOLUS
INTRAVENOUS | Status: DC | PRN
  Administered 2021-09-25: 130 mg via INTRAVENOUS

## 2021-09-25 MED ORDER — PHENYLEPHRINE HCL (PRESSORS) 10 MG/ML IV SOLN
INTRAVENOUS | Status: DC | PRN
  Administered 2021-09-25: 120 ug via INTRAVENOUS

## 2021-09-25 NOTE — Anesthesia Procedure Notes (Signed)
Arterial Line Insertion ?Start/End3/13/2023 7:57 PM, 09/25/2021 8:02 PM ?Performed by: Santa Lighter, MD, anesthesiologist ? Patient location: OR. ?Preanesthetic checklist: patient identified, IV checked, site marked, risks and benefits discussed, surgical consent, monitors and equipment checked, pre-op evaluation, timeout performed and anesthesia consent ?Left, radial was placed ?Catheter size: 20 G ?Hand hygiene performed  and maximum sterile barriers used  ? ?Attempts: 1 ?Procedure performed without using ultrasound guided technique. ?Following insertion, dressing applied and Biopatch. ?Post procedure assessment: normal and unchanged ? ?Post procedure complications: second provider assisted. ?Patient tolerated the procedure well with no immediate complications. ? ? ?

## 2021-09-25 NOTE — Procedures (Signed)
INR . Bilateral common carotid arteriograms.  ?RT CFA approach . ?Findings. ?1.Occluded RT MCA M1 origin.  ?S/P complete revascularization of occluded RT MCA origin with x 1 pass with 91m x 40 mm Solitaire X retriever and contact aspiration achieving a TICi 3 revascularization . ?Post CT brain NO ICH. ?67F angioseal used for hemostasis at RT CFA puncture site. Distal pulses all intact.  ?Patient left intubated  for airway protection due to med condition. ? ?S.Sulay Brymer MD . ?

## 2021-09-25 NOTE — Progress Notes (Signed)
RT Note:  Received patient from IR. Placed on mechanical ventilation. Tube holder placed. ETT 22 cm '@lip'$ . Placed on VT 650-18-100%+5 per CCM at bedside. ?

## 2021-09-25 NOTE — Consult Note (Signed)
? ?NAME:  Ricardo Jones, MRN:  [id number], DOB:  12-11-1970, LOS: 0 ?ADMISSION DATE:  09/25/2021, CONSULTATION DATE:  09/25/21 ?REFERRING MD:  Leonel Ramsay, CHIEF COMPLAINT:  Acute CVA  ? ?History of Present Illness:  ?Ricardo Jones is a 51 y.o. M with PMH of Atrial fibrillation not on anti-coagulation s/p ICD, ? Cardiac sarcoid who was brought into the ED after experiencing L-sided weakness, AMS and aphasia that began while on an elliptical machine at the gym.  LKW 1807 3/13.  He was given TNK and work-up revealed R MCA M1  occlusion.  He was taken for thrombectomy with TiCi3 revascularization after one pass and post procedure CT brain without ICH.  Post-procedure pt was left intubated and PCCM consulted for ventilator management ? ?Pertinent  Medical History  ? has a past medical history of Bradycardia, S/P dilatation of esophageal stricture, and Typical atrial flutter ([place]). 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 ?Significant Hospital Events: ?Including procedures, antibiotic start and stop dates in addition to other pertinent events   ?3/13 Presented as Code stroke, received TNK and taken for revascularization, left intubated post-procedure ? ?Interim History / Subjective:  ?Pt arrived to the ICU on 20mg propofol and Neo 327m ? ? ?Objective   ?Blood pressure 138/85, resp. rate 20, height '6\' 7"'$  (2.007 m), weight 115.6 kg, SpO2 99 %. ?   ?   ? ?Intake/Output Summary (Last 24 hours) at 09/25/2021 2125 ?Last data filed at 09/25/2021 2113 ?Gross per 24 hour  ?Intake 1600 ml  ?Output --  ?Net 1600 ml  ? ?Filed Weights  ? 09/25/21 1800  ?Weight: 115.6 kg  ? ? ?General:  well-nourished M, intubated and sedated on full vent support ?HEENT: MM pink/moist, pupils equal, sclera anicteric ?Neuro: examined on propofol, unresponsive to voice or pain, RASS -5 ?CV: s1s2 rrr, no m/r/g ?PULM:  mechanical breath sounds bilaterally, minimal vent settings, no rhonchi or wheezing ?GI: soft, bsx4 active  ?Extremities: warm/dry, no edema  ?Skin: no  rashes or lesions ? ? ?Resolved Hospital Problem list   ? ? ?Assessment & Plan:  ? ?Acute CVA, R MI LVO with expected post-op ventilator management ?TICi 3 revascularization  ?-post-stroke care per neurology, MRI, echo pending ?-serial neuro exams ?-BP control, expect can wean off Neosynephrine with decreased sedation, add Fentanyl ?-ABG and CXR ?-supportive care ?--Maintain full vent support with SAT/SBT as tolerated ?-titrate Vent setting to maintain SpO2 greater than or equal to 90%. ?-HOB elevated 30 degrees. ?-Plateau pressures less than 30 cm H20.  ?-Follow chest x-ray, ABG prn.   ?-Bronchial hygiene and RT/bronchodilator protocol. ? 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 ? ? ? ? ?Best Practice (right click and "Reselect all SmartList Selections" daily)  ? ?Diet/type: NPO ?DVT prophylaxis: SCD ?GI prophylaxis: PPI ?Lines: N/A ?Foley:  N/A ?Code Status:  full code ?Last date of multidisciplinary goals of care discussion '[]'$  ? ?Labs   ?CBC: ?Recent Labs  ?Lab 09/25/21 ?1840 09/25/21 ?1905  ?WBC 6.8  --   ?NEUTROABS 4.4  --   ?HGB 14.4 14.6  ?HCT 44.1 43.0  ?MCV 90.4  --   ?PLT 159  --   ? ? ?Basic Metabolic Panel: ?Recent Labs  ?Lab 09/25/21 ?1840 09/25/21 ?1905  ?NA 140 141  ?K 4.3 4.4  ?CL 104 105  ?CO2 25  --   ?GLUCOSE 102* 97  ?BUN  20 24*  ?CREATININE 1.11 1.10  ?CALCIUM 9.2  --   ? ?GFR: ?Estimated Creatinine Clearance: 113.9 mL/min (by C-G formula based on SCr of 1.1 mg/dL). ?Recent Labs  ?Lab 09/25/21 ?1840  ?WBC 6.8  ? ? ?Liver Function Tests: ?Recent Labs  ?Lab 09/25/21 ?1840  ?AST 28  ?ALT 25  ?ALKPHOS 72  ?BILITOT 0.9  ?PROT 6.6  ?ALBUMIN 3.9  ? ?No results for input(s): LIPASE, AMYLASE in the last 168 hours. ?No results for input(s): AMMONIA in the last 168 hours. ? ?ABG ?   ?Component Value Date/Time  ? TCO2 27 09/25/2021 1905  ?   ? ?Coagulation Profile: ?Recent Labs  ?Lab 09/25/21 ?1840  ?INR 1.1  ? ? ?Cardiac Enzymes: ?No results for input(s): CKTOTAL, CKMB, CKMBINDEX, TROPONINI in the last 168 hours. ? ?HbA1C: ?No results found for: HGBA1C ? ?CBG: ?Recent Labs  ?Lab 09/25/21 ?1838  ?GLUCAP 90  ? ? ?Review of Systems:   ?Unable to obtain secondary to intuabtion  ? ?Past Medical History:  ?He,  has a past medical history of Bradycardia, S/P dilatation of esophageal stricture, and Typical atrial flutter ([place]).  ? ?Surgical History:  ? ?Past Surgical History:  ?Procedure Laterality Date  ? BIV ICD INSERTION CRT-D N/A 09/08/2018  ? Procedure: BIV ICD INSERTION CRT-D;  Surgeon: Evans Lance, MD;  Location: [facility];  Service: Cardiovascular;  Laterality: N/A;  ? THROAT SURGERY  2012  ?  ? ?Social History:  ? reports that he has never smoked. He has never used smokeless tobacco. He reports current alcohol use. He reports that he does not use drugs.  ? ?Family History:  ?His family history includes Asthma in his mother; Bradycardia in an other family member; Cancer in his maternal grandfather, maternal grandmother, and paternal grandmother.  ? ?Allergies ?No Known Allergies  ? ?Home Medications  ?Prior to Admission medications   ?Medication Sig Start Date End Date Taking? Authorizing Provider  ?PREVIDENT 5000 BOOSTER PLUS 1.1 % PSTE as directed. 09/23/18   [provider]  ?  ? ?Critical care time:  35 minutes ?  ? ? ? ?CRITICAL CARE ?Performed by: Otilio Carpen Althia Egolf ? ? ?Total critical care time: 35 minutes ? ?Critical care time was exclusive of separately billable procedures and treating other patients. ? ?Critical care was necessary to treat or prevent imminent or life-threatening deterioration. ? ?Critical care was time spent personally by me on the following activities: development of treatment plan with patient and/or surrogate as well as nursing, discussions with consultants, evaluation of patient's response to treatment,  examination of patient, obtaining history from patient or surrogate, ordering and performing treatments and interventions, ordering and review of laboratory studies, ordering and review of radiographic studies, pulse oximetry and re-evaluation of patient's condition. ? ?Otilio Carpen Deasiah Hagberg, PA-C ?[facility] ?See Amion for pager ?If no response to pager , please call [phone] until 7pm ?After 7:00 pm call Elink  [phone] ? ?

## 2021-09-25 NOTE — Transfer of Care (Signed)
Immediate Anesthesia Transfer of Care Note ? ?Patient: Ricardo Jones ? ?Procedure(s) Performed: IR WITH ANESTHESIA ? ?Patient Location: ICU ? ?Anesthesia Type:General ? ?Level of Consciousness: Patient remains intubated per anesthesia plan ? ?Airway & Oxygen Therapy: Patient remains intubated per anesthesia plan and Patient placed on Ventilator (see vital sign flow sheet for setting) ? ?Post-op Assessment: Report given to RN and Post -op Vital signs reviewed and stable ? ?Post vital signs: Reviewed and stable ? ?Last Vitals:  ?Vitals Value Taken Time  ?BP    ?Temp    ?Pulse 69 09/25/21 2157  ?Resp 0 09/25/21 2157  ?SpO2 100 % 09/25/21 2157  ?Vitals shown include unvalidated device data. ? ?Last Pain: There were no vitals filed for this visit.   ? ?  ? ?Complications: No notable events documented. ?

## 2021-09-25 NOTE — Progress Notes (Signed)
PHARMACIST CODE STROKE RESPONSE ? ?Notified to mix TNK at 6:50 by Dr. Leonel Ramsay ?Delivered TNK to RN at 6:52 ? ?TNK dose = 25 mg IV over 5 seconds.  ? ?Issues/delays encountered (if applicable): radiology evaluation for lesion for decision to give TNK ? ?Ricardo Jones ?09/25/21 7:02 PM ?

## 2021-09-25 NOTE — Progress Notes (Signed)
eLink Physician-Brief Progress Note ?Patient Name: Ricardo Jones ?DOB: 01-07-1971 ?MRN: [id number] ? ? ?Date of Service ? 09/25/2021  ?HPI/Events of Note ? Patient admitted with left sided weakness and received TNK, then underwent right MCA M 1 branch thrombectomy by interventional neurology, he was brought back to the ICU intubated, sedated and mechanically ventilated.  ?eICU Interventions ? New Patient Evaluation.  ? ? ? ?  ? ?Frederik Pear ?09/25/2021, 9:57 PM ?

## 2021-09-25 NOTE — Anesthesia Procedure Notes (Signed)
Procedure Name: Intubation ?Date/Time: 09/25/2021 7:54 PM ?Performed by: Danilynn Jemison T, CRNA ?Pre-anesthesia Checklist: Patient identified, Emergency Drugs available, Suction available and Patient being monitored ?Patient Re-evaluated:Patient Re-evaluated prior to induction ?Oxygen Delivery Method: Circle system utilized ?Preoxygenation: Pre-oxygenation with 100% oxygen ?Induction Type: IV induction, Rapid sequence and Cricoid Pressure applied ?Laryngoscope Size: Sabra Heck and 3 ?Grade View: Grade II ?Tube type: Oral ?Tube size: 7.5 mm ?Number of attempts: 1 ?Airway Equipment and Method: Stylet and Oral airway ?Placement Confirmation: ETT inserted through vocal cords under direct vision, positive ETCO2 and breath sounds checked- equal and bilateral ?Secured at: 22 cm ?Tube secured with: Tape ?Dental Injury: Teeth and Oropharynx as per pre-operative assessment  ? ? ? ? ?

## 2021-09-25 NOTE — Consult Note (Signed)
Neurology Consultation  Reason for Consult: Code Stroke Referring Physician: Dr. Matilde Sprang  CC: Patient is unable to provide a chief complaint due to aphasia on arrival. Per EMS, aphasia, left-sided weakness  History is obtained from: EMS, unable to obtain from patient due to aphasia, chart review, patient's wife via telephone  HPI: Ricardo Jones is a 51 y.o. male with a medical history significant for atrial flutter not on anticoagulation due to CHA2DS2-VASc score of 1 and presumed cardiac sarcoid who presented to the ED via EMS from the [facility].  Patient was on the elliptical when a bystander noticed that he was unresponsive and acting "off".  The bystander assisted patient off the elliptical and activated EMS.  On EMS arrival patient had a right gaze, was nonverbal, did not follow commands, and had left-sided weakness and was activated as a code stroke for further evaluation.  His wife reports that he has been completely normal, no recent illnesses or other concerns.  LKW: Patient seen walking to the gym at 18:07, acting normally TNK given?:  Yes IR Thrombectomy?  Yes Modified Rankin Scale: 0-Completely asymptomatic and back to baseline post- stroke  XIP:JASNKN to obtain due to altered mental status.   Past Medical History:  Diagnosis Date   Bradycardia    asymptomatic   S/P dilatation of esophageal stricture    Typical atrial flutter [facility])    Past Surgical History:  Procedure Laterality Date   BIV ICD INSERTION CRT-D N/A 09/08/2018   Procedure: BIV ICD INSERTION CRT-D;  Surgeon: Evans Lance, MD;  Location: [facility];  Service: Cardiovascular;  Laterality: N/A;   THROAT SURGERY  2012   Family History  Problem Relation Age of Onset   Bradycardia Other        multiple family members with pacemakers   Asthma Mother    Cancer Maternal Grandmother    Cancer Maternal Grandfather    Cancer Paternal Grandmother    Social History:   reports that he has never smoked. He has  never used smokeless tobacco. He reports current alcohol use. He reports that he does not use drugs.  Medications No current facility-administered medications for this encounter.  Current Outpatient Medications:    PREVIDENT 5000 BOOSTER PLUS 1.1 % PSTE, as directed., Disp: , Rfl:   Exam: Current vital signs: BP 140/87 (BP Location: Right Arm)    Ht '6\' 7"'$  (2.007 m)    Wt 115.6 kg    BMI 28.71 kg/m  Vital signs in last 24 hours: BP: (140)/(87) 140/87 (03/13 1843) Weight:  [115.6 kg] 115.6 kg (03/13 1800)  GENERAL: Awake, alert, in no acute distress Psych: Unable to assess due to patient's condition Head: Normocephalic and atraumatic, without obvious abnormality EENT: Normal conjunctivae, dry mucous membranes, no OP obstruction LUNGS: Normal respiratory effort. Non-labored breathing on room air CV: Regular rate and rhythm on telemetry ABDOMEN: Soft, non-tender, non-distended Extremities: Warm, well perfused, without obvious deformity  NEURO:  Mental Status: Awake, alert, globally aphasic. Patient does not verbalize, does not follow commands, does not answer orientation questions. Cranial Nerves:  II: PERRL.  III, IV, VI: Patient has a right gaze preference and is unable to cross midline towards the left. V: Does not blink to threat on the left visual fields. VII: Face is asymmetric with complete left facial paralysis with grimace VIII: Unable to assess due to patient's condition IX, X: Patient does not allow for evaluation of soft palate. XI: Head is grossly midline XII: Does not protrude  tongue to command Motor: Right upper extremity is able to elevate antigravity without vertical drift with constant coaching. Right lower extremity has some antigravity movement but patient is unable to follow commands to sustain antigravity movement. Left upper lower extremities withdraw to noxious stimuli. Tone is normal. Bulk is normal.  Sensation: Patient grimaces and withdraws to noxious  stimuli throughout. Coordination: Does not perform. Gait: Deferred  NIHSS: 1a Level of Conscious.: 0 1b LOC Questions: 2 1c LOC Commands: 2 2 Best Gaze: 1 3 Visual: 2 4 Facial Palsy: 2 5a Motor Arm - left: 3 5b Motor Arm - Right: 0 6a Motor Leg - Left: 3 6b Motor Leg - Right: 2 7 Limb Ataxia: 0 8 Sensory: 0 9 Best Language: 3 10 Dysarthria: 2 11 Extinct. and Inatten.: 0 TOTAL: 22  Labs I have reviewed labs in epic and the results pertinent to this consultation are: CBC    Component Value Date/Time   WBC 4.8 09/08/2018 0312   RBC 4.95 09/08/2018 0312   HGB 14.3 09/08/2018 0312   HCT 44.4 09/08/2018 0312   PLT 103 (L) 09/08/2018 0312   MCV 89.7 09/08/2018 0312   MCH 28.9 09/08/2018 0312   MCHC 32.2 09/08/2018 0312   RDW 12.7 09/08/2018 0312   LYMPHSABS 2.0 09/07/2018 1950   MONOABS 0.5 09/07/2018 1950   EOSABS 0.2 09/07/2018 1950   BASOSABS 0.0 09/07/2018 1950   CMP     Component Value Date/Time   NA 146 (H) 12/15/2018 1041   K 4.7 12/15/2018 1041   CL 104 12/15/2018 1041   CO2 24 12/15/2018 1041   GLUCOSE 98 12/15/2018 1041   GLUCOSE 96 09/09/2018 0255   BUN 23 12/15/2018 1041   CREATININE 1.01 12/15/2018 1041   CALCIUM 9.5 12/15/2018 1041   PROT 6.8 09/07/2018 1950   ALBUMIN 4.3 09/07/2018 1950   AST 30 09/07/2018 1950   ALT 39 09/07/2018 1950   ALKPHOS 98 09/07/2018 1950   BILITOT 1.5 (H) 09/07/2018 1950   GFRNONAA 87 12/15/2018 1041   GFRAA 100 12/15/2018 1041   Lipid Panel     Component Value Date/Time   CHOL  08/20/2010 0002    179        ATP III CLASSIFICATION:  <200     mg/dL   Desirable  200-239  mg/dL   Borderline High  >=240    mg/dL   High          TRIG 31 08/20/2010 0002   HDL 45 08/20/2010 0002   CHOLHDL 4.0 08/20/2010 0002   VLDL 6 08/20/2010 0002   LDLCALC (H) 08/20/2010 0002    128        Total Cholesterol/HDL:CHD Risk Coronary Heart Disease Risk Table                     Men   Women  1/2 Average Risk   3.4   3.3   Average Risk       5.0   4.4  2 X Average Risk   9.6   7.1  3 X Average Risk  23.4   11.0        Use the calculated Patient Ratio above and the CHD Risk Table to determine the patient's CHD Risk.        ATP III CLASSIFICATION (LDL):  <100     mg/dL   Optimal  100-129  mg/dL   Near or Above  Optimal  130-159  mg/dL   Borderline  160-189  mg/dL   High  >190     mg/dL   Very High  No results found for: HGBA1C  Imaging I have reviewed the images obtained:  CT-scan of the brain 3/13: 1. No acute intracranial abnormality. 2. ASPECTS is 10.  CTA - R M1 occlusion  Assessment: 51 y.o. 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 Patient's last known well time was when he walked into the gym at 18:07, otherwise, patient's family saw him at his baseline at 07:00 this morning. Patient with a history of atrial flutter not on anticoagulation due to CHA2DS2-VASc score of 1 per outpatient cardiology notes. There was some delay for CTA and TNKase due to inability to obtain peripheral IV access, but once obtained there was an M1 occlusion.  After discussion of the risks and benefits of IV tenecteplase, his wife agreed with proceeding.  Then, after discussion with Dr. Estanislado Pandy regarding the risks and benefits of thrombectomy, she agreed with proceeding with that intervention as well.  Impression: M1 occlusion, likely secondary to atrial flutter.   Anibal Henderson, AGAC-NP [facility] Pager: [phone]  I have seen the patient and was present for and guiding the entirety of the evaluation and management reflected in the above note.  He has atrial flutter, not on anticoagulation due to low CHA2DS2-VASc, now with M1 occlusion presumably from embolization.  He is a candidate for thrombectomy, and after  discussion of risks and benefits, proceeded with both TNKase and thrombectomy.  Recommendations: - HgbA1c, fasting lipid panel -MRI tomorrow, ICD is compatible - Frequent neuro checks - Echocardiogram - Prophylactic therapy-none for 24 hours - Risk factor modification - Telemetry monitoring - PT consult, OT consult, Speech consult - Stroke team to follow  This patient is critically ill and at significant risk of neurological worsening, death and care requires constant monitoring of vital signs, hemodynamics,respiratory and cardiac monitoring, neurological assessment, discussion with family, other specialists and medical decision making of high complexity. I spent 65 minutes of neurocritical care time  in the care of  this patient. This was time spent independent of any time provided by nurse practitioner or PA.  Roland Rack, MD [facility] [phone]  If 7pm- 7am, please page neurology on call as listed in [place]. 09/25/2021  7:49 PM

## 2021-09-25 NOTE — Progress Notes (Signed)
Patient ID: Ricardo Jones, male   DOB: [birth year],    MRN: [id number] ? ?24 Y RT H M LSW 6.07 pm  ?MRS 0  ?New onset RT gaze deviation and Lt sided ?weakness and non verbal . ?CT brain No ICH. ASPECTS 10? ?CTA occluded RT MCA origin.  ?Endovascular treatment D/W spouse in a 3 way call. Procedure,reasons,risks and alternatives discussed. Risks of ICH of 10 %,worsening neuro condition,death and inability to revascularize reviewed.  ?Spouse expressed understanding and provided consent to the treatment. ? ?S.Manisha Cancel MD. ?

## 2021-09-25 NOTE — Code Documentation (Addendum)
Stroke Response Nurse Documentation ?Code Documentation ? ?CALAN DOREN is a 51 y.o. male arriving to [facility]  via [facility] on 09/25/2021 with past medical hx of atrial flutter, bradycardia, s/p dilation of esophageal stricture and BIV ICD insertion 08/2018. Code stroke was activated by EMS.  ? ?Patient from gym where he was LKW at 1807 while on the elliptical machine. Someone noted he was acting strange. Helped him off the machine. Called 911. Noted to be flaccid on the left and aphasic.  ? ?Stroke team at the bedside on patient arrival. Unable to get labs, but patient cleared for CT by Dr. Tinnie Gens. Patient to CT with team. Labs obtained in CT. NIHSS 26, see documentation for details and code stroke times. Patient with disoriented, not following commands, right gaze preference , left hemianopia, left facial droop, left arm weakness, left leg weakness, Global aphasia , dysarthria , and Visual  neglect on exam. The following imaging was completed:  CT Head and CTA. Patient is a candidate for IV Thrombolytic, given 1859. Delay related to IV access issues. Patient is a candidate for IR due to LVO. Dr. Leonel Ramsay discussed case with Dr. Bronson Curb and they obtained consent from patient's wife. Taken to [employer].Groin prepped. Pulses marked. Wife with patient briefly while waiting for IR team to arrive.  ? ?Care Plan: admit to ICU- follow post TNK and IR protocol and orderset.  ? ?Bedside handoff with IR RN.   ? ?Leverne Humbles ?Stroke Response RN ? ? ?

## 2021-09-25 NOTE — Anesthesia Preprocedure Evaluation (Signed)
Anesthesia Evaluation  ?Patient identified by MRN, date of birth, ID band ?Patient unresponsive ? ?General Assessment Comment:CODE STROKE ? ?Reviewed: ?Allergy & Precautions, NPO status , Patient's Chart, lab work & pertinent test results ? ?Airway ?Mallampati: Unable to assess ? ?TM Distance: >3 FB ? ? ? ? Dental ? ?(+) Teeth Intact, Dental Advisory Given ?  ?Pulmonary ?neg pulmonary ROS,  ?  ?Pulmonary exam normal ?breath sounds clear to auscultation ? ? ? ? ? ? Cardiovascular ?+CHF  ?Normal cardiovascular exam+ dysrhythmias Atrial Fibrillation + Cardiac Defibrillator ? ?Rhythm:Regular Rate:Normal ? ?Cardiac sarcoid ?EF 45-50% ?  ?Neuro/Psych ?CVA, Residual Symptoms   ? GI/Hepatic ?negative GI ROS, Neg liver ROS,   ?Endo/Other  ?negative endocrine ROS ? Renal/GU ?negative Renal ROS  ? ?  ?Musculoskeletal ?negative musculoskeletal ROS ?(+)  ? Abdominal ?  ?Peds ? Hematology ?negative hematology ROS ?(+)   ?Anesthesia Other Findings ?Day of surgery medications reviewed with the patient. ? Reproductive/Obstetrics ? ?  ? ? ? ? ? ? ? ? ? ? ? ? ? ?  ?  ? ? ? ? ? ? ? ? ?Anesthesia Physical ?Anesthesia Plan ? ?ASA: 4 and emergent ? ?Anesthesia Plan: General  ? ?Post-op Pain Management: Minimal or no pain anticipated  ? ?Induction: Intravenous ? ?PONV Risk Score and Plan: 2 and Treatment may vary due to age or medical condition ? ?Airway Management Planned: Oral ETT ? ?Additional Equipment: Arterial line ? ?Intra-op Plan:  ? ?Post-operative Plan: Post-operative intubation/ventilation ? ?Informed Consent:  ? ? ? ?Only emergency history available ? ?Plan Discussed with: CRNA ? ?Anesthesia Plan Comments: (CODE STROKE, patient obtunded, no family present, Pre-op Eval completed after induction of anesthesia due to emergent nature of procedure.)  ? ? ? ? ? ? ?Anesthesia Quick Evaluation ? ?

## 2021-09-26 ENCOUNTER — Other Ambulatory Visit (HOSPITAL_COMMUNITY): Payer: Self-pay

## 2021-09-26 ENCOUNTER — Inpatient Hospital Stay (HOSPITAL_COMMUNITY): Payer: BC Managed Care – PPO

## 2021-09-26 ENCOUNTER — Other Ambulatory Visit: Payer: Self-pay | Admitting: Radiology

## 2021-09-26 ENCOUNTER — Encounter (HOSPITAL_COMMUNITY): Payer: Self-pay | Admitting: Neurology

## 2021-09-26 DIAGNOSIS — J9589 Other postprocedural complications and disorders of respiratory system, not elsewhere classified: Secondary | ICD-10-CM | POA: Diagnosis not present

## 2021-09-26 DIAGNOSIS — I4892 Unspecified atrial flutter: Secondary | ICD-10-CM

## 2021-09-26 DIAGNOSIS — I63411 Cerebral infarction due to embolism of right middle cerebral artery: Secondary | ICD-10-CM | POA: Diagnosis not present

## 2021-09-26 DIAGNOSIS — E876 Hypokalemia: Secondary | ICD-10-CM

## 2021-09-26 DIAGNOSIS — I255 Ischemic cardiomyopathy: Secondary | ICD-10-CM | POA: Diagnosis not present

## 2021-09-26 DIAGNOSIS — I6389 Other cerebral infarction: Secondary | ICD-10-CM

## 2021-09-26 DIAGNOSIS — Z9282 Status post administration of tPA (rtPA) in a different facility within the last 24 hours prior to admission to current facility: Secondary | ICD-10-CM

## 2021-09-26 DIAGNOSIS — I6601 Occlusion and stenosis of right middle cerebral artery: Secondary | ICD-10-CM | POA: Diagnosis not present

## 2021-09-26 LAB — CBC WITH DIFFERENTIAL/PLATELET
Abs Immature Granulocytes: 0.01 10*3/uL (ref 0.00–0.07)
Basophils Absolute: 0 10*3/uL (ref 0.0–0.1)
Basophils Relative: 1 %
Eosinophils Absolute: 0.2 10*3/uL (ref 0.0–0.5)
Eosinophils Relative: 3 %
HCT: 37.1 % — ABNORMAL LOW (ref 39.0–52.0)
Hemoglobin: 12.7 g/dL — ABNORMAL LOW (ref 13.0–17.0)
Immature Granulocytes: 0 %
Lymphocytes Relative: 32 %
Lymphs Abs: 1.5 10*3/uL (ref 0.7–4.0)
MCH: 29.4 pg (ref 26.0–34.0)
MCHC: 34.2 g/dL (ref 30.0–36.0)
MCV: 85.9 fL (ref 80.0–100.0)
Monocytes Absolute: 0.4 10*3/uL (ref 0.1–1.0)
Monocytes Relative: 9 %
Neutro Abs: 2.6 10*3/uL (ref 1.7–7.7)
Neutrophils Relative %: 55 %
Platelets: 144 10*3/uL — ABNORMAL LOW (ref 150–400)
RBC: 4.32 MIL/uL (ref 4.22–5.81)
RDW: 12.5 % (ref 11.5–15.5)
WBC: 4.6 10*3/uL (ref 4.0–10.5)
nRBC: 0 % (ref 0.0–0.2)

## 2021-09-26 LAB — URINALYSIS, ROUTINE W REFLEX MICROSCOPIC
Bilirubin Urine: NEGATIVE
Glucose, UA: NEGATIVE mg/dL
Hgb urine dipstick: NEGATIVE
Ketones, ur: NEGATIVE mg/dL
Leukocytes,Ua: NEGATIVE
Nitrite: NEGATIVE
Protein, ur: NEGATIVE mg/dL
Specific Gravity, Urine: 1.044 — ABNORMAL HIGH (ref 1.005–1.030)
pH: 6 (ref 5.0–8.0)

## 2021-09-26 LAB — ECHOCARDIOGRAM COMPLETE
AR max vel: 1.11 cm2
AV Area VTI: 1.21 cm2
AV Area mean vel: 1.16 cm2
AV Mean grad: 5 mmHg
AV Peak grad: 10.2 mmHg
Ao pk vel: 1.6 m/s
Height: 79.016 in
S' Lateral: 4.9 cm
Single Plane A4C EF: 23.8 %
Weight: 4158.76 oz

## 2021-09-26 LAB — GLUCOSE, CAPILLARY
Glucose-Capillary: 110 mg/dL — ABNORMAL HIGH (ref 70–99)
Glucose-Capillary: 112 mg/dL — ABNORMAL HIGH (ref 70–99)
Glucose-Capillary: 114 mg/dL — ABNORMAL HIGH (ref 70–99)
Glucose-Capillary: 83 mg/dL (ref 70–99)

## 2021-09-26 LAB — BASIC METABOLIC PANEL
Anion gap: 8 (ref 5–15)
BUN: 15 mg/dL (ref 6–20)
CO2: 23 mmol/L (ref 22–32)
Calcium: 8.4 mg/dL — ABNORMAL LOW (ref 8.9–10.3)
Chloride: 109 mmol/L (ref 98–111)
Creatinine, Ser: 0.89 mg/dL (ref 0.61–1.24)
GFR, Estimated: >60 mL/min (ref >60)
Glucose, Bld: 104 mg/dL — ABNORMAL HIGH (ref 70–99)
Potassium: 3.3 mmol/L — ABNORMAL LOW (ref 3.5–5.1)
Sodium: 140 mmol/L (ref 135–145)

## 2021-09-26 LAB — HEMOGLOBIN A1C
Hgb A1c MFr Bld: 6 % — ABNORMAL HIGH (ref 4.8–5.6)
Mean Plasma Glucose: 126 mg/dL

## 2021-09-26 LAB — RAPID URINE DRUG SCREEN, HOSP PERFORMED
Amphetamines: NOT DETECTED
Barbiturates: NOT DETECTED
Benzodiazepines: NOT DETECTED
Cocaine: NOT DETECTED
Opiates: NOT DETECTED
Tetrahydrocannabinol: NOT DETECTED

## 2021-09-26 LAB — LIPID PANEL
Cholesterol: 155 mg/dL (ref 0–200)
HDL: 36 mg/dL — ABNORMAL LOW (ref >40)
LDL Cholesterol: 100 mg/dL — ABNORMAL HIGH (ref 0–99)
Total CHOL/HDL Ratio: 4.3 RATIO
Triglycerides: 96 mg/dL (ref <150)
VLDL: 19 mg/dL (ref 0–40)

## 2021-09-26 LAB — TRIGLYCERIDES: Triglycerides: 92 mg/dL (ref <150)

## 2021-09-26 LAB — MRSA NEXT GEN BY PCR, NASAL: MRSA by PCR Next Gen: NOT DETECTED

## 2021-09-26 MED ORDER — DEXTROSE-NACL 5-0.9 % IV SOLN: INTRAVENOUS | Status: DC

## 2021-09-26 MED ORDER — POTASSIUM CHLORIDE 20 MEQ PO PACK: 40.0000 meq | PACK | Freq: Once | ORAL | Status: DC

## 2021-09-26 MED ORDER — POTASSIUM CHLORIDE 20 MEQ PO PACK: 40.0000 meq | PACK | ORAL | Status: DC

## 2021-09-26 MED ORDER — ATORVASTATIN CALCIUM 40 MG PO TABS
40.0000 mg | ORAL_TABLET | Freq: Every day | ORAL | Status: DC
Start: 2021-09-26 — End: 2021-09-28
  Administered 2021-09-26 – 2021-09-28 (×3): 40 mg via ORAL
  Filled 2021-09-26 (×3): qty 1

## 2021-09-26 MED ORDER — BETHANECHOL CHLORIDE 10 MG PO TABS
10.0000 mg | ORAL_TABLET | Freq: Three times a day (TID) | ORAL | Status: DC
  Administered 2021-09-26 – 2021-09-28 (×5): 10 mg via ORAL
  Filled 2021-09-26 (×5): qty 1

## 2021-09-26 MED ORDER — POTASSIUM CHLORIDE 10 MEQ/100ML IV SOLN
10.0000 meq | INTRAVENOUS | Status: DC
  Administered 2021-09-26 (×4): 10 meq via INTRAVENOUS
  Filled 2021-09-26 (×4): qty 100

## 2021-09-26 MED ORDER — ASPIRIN EC 325 MG PO TBEC
325.0000 mg | DELAYED_RELEASE_TABLET | Freq: Every day | ORAL | Status: DC
  Administered 2021-09-26 – 2021-09-27 (×2): 325 mg via ORAL
  Filled 2021-09-26 (×2): qty 1

## 2021-09-26 MED ORDER — POTASSIUM CHLORIDE 20 MEQ PO PACK
40.0000 meq | PACK | Freq: Once | ORAL | Status: AC
  Administered 2021-09-26: 40 meq via ORAL
  Filled 2021-09-26: qty 2

## 2021-09-26 NOTE — Procedures (Signed)
Extubation Procedure Note ? ?Patient Details:   ?Name: Ricardo Jones ?DOB: July 29, 1970 ?MRN: [id number] ?  ?Airway Documentation:  ?  ?Vent end date: 09/26/21 Vent end time: 0945  ? ?Evaluation ? O2 sats: stable throughout ?Complications: No apparent complications ?Patient did tolerate procedure well. ?Bilateral Breath Sounds: Clear, Diminished ?  ?No ? ?Pt extubated per MD order. Placed on 4L [place]. Positive cuff leak noted. No stridor heard. RT will continue to monitor.  ? ?Tobi Bastos ?09/26/2021, 9:51 AM ? ?

## 2021-09-26 NOTE — Progress Notes (Signed)
? ? ?Referring Physician(s): ?Parker Hannifin ? ?Supervising Physician: Luanne Bras ? ?Patient Status:  [facility] - In-pt ? ?Chief Complaint: ? ?Code Stroke. Occluded RT MCA origin with x 1 pass with 79m x 40 mm Solitaire X retriever and contact aspiration achieving a TICi 3 revascularization ? ?Subjective: ? ?Patient seen at bedside. Wife present. Patient is now extubated. ? ? ?Allergies: ?Patient has no known allergies. ? ?Medications: ?Prior to Admission medications   ?Medication Sig Start Date End Date Taking? Authorizing Provider  ?PREVIDENT 5000 BOOSTER PLUS 1.1 % PSTE as directed. 09/23/18   [provider]  ? ? ? ?Vital Signs: ?BP 109/64   Pulse 70   Temp (!) 97.5 ?F (36.4 ?C) (Axillary)   Resp 18   Ht 6' 7.02" (2.007 m)   Wt 259 lb 14.8 oz (117.9 kg)   SpO2 98%   BMI 29.27 kg/m?  ? ?Physical Exam ?Vitals and nursing note reviewed.  ?Constitutional:   ?   Appearance: He is well-developed.  ?HENT:  ?   Head: Normocephalic.  ?Cardiovascular:  ?   Rate and Rhythm: Normal rate and regular rhythm.  ?   Heart sounds: Normal heart sounds.  ?   Comments: Site is soft with no active bleeding and no appreciable pseudoaneurysm. Dressing is C/D/I ? ?Pulmonary:  ?   Effort: Pulmonary effort is normal.  ?   Breath sounds: Normal breath sounds.  ?Musculoskeletal:     ?   General: Normal range of motion.  ?   Cervical back: Normal range of motion.  ?Skin: ?   General: Skin is dry.  ?Neurological:  ?   Mental Status: He is alert and oriented to person, place, and time.  ?   Motor: Weakness present.  ?   Comments: Alert, aware and oriented X 3 ?Speech and comprehension is intact.  ?PERRL bilaterally ?No facial droop noted ?Tongue midline ?Can spontaneously move all 4 extremities. Hand grip strength equal bilaterally. ? ?Fine motor and coordination slow but intact.  ?Cognition and language  are generally intact. Word searching present ?Comprehension and fluency are normal.  ?Judgment and insight normal ? ?Gait not  assessed ?Romberg not assessed ?Heel to toe not assessed ?Distal pulses not assessed ? ?  ? ? ?Imaging: ?DG CHEST PORT 1 VIEW ? ?Result Date: 09/25/2021 ?CLINICAL DATA:  Endotracheal tube placement EXAM: PORTABLE CHEST 1 VIEW COMPARISON:  09/09/2018 FINDINGS: Endotracheal tube has been placed with tip measuring 5 cm above the carina. A cardiac pacemaker is present. Cardiac enlargement without vascular congestion or edema. Linear atelectasis or infiltration demonstrated in both upper lungs. No pleural effusions. No pneumothorax. IMPRESSION: Endotracheal tube appears in satisfactory position measuring 5 cm above the carina. Cardiac enlargement. Linear areas of atelectasis or infiltration in the upper lungs. Electronically Signed   By: WLucienne CapersM.D.   On: 09/25/2021 23:15  ? ?CT HEAD CODE STROKE WO CONTRAST ? ?Result Date: 09/25/2021 ?CLINICAL DATA:  Code stroke.  Acute neurologic deficit EXAM: CT HEAD WITHOUT CONTRAST TECHNIQUE: Contiguous axial images were obtained from the base of the skull through the vertex without intravenous contrast. RADIATION DOSE REDUCTION: This exam was performed according to the departmental dose-optimization program which includes automated exposure control, adjustment of the mA and/or kV according to patient size and/or use of iterative reconstruction technique. COMPARISON:  None. FINDINGS: Brain: There is no mass, hemorrhage or extra-axial collection. The size and configuration of the ventricles and extra-axial CSF spaces are normal. The brain parenchyma is normal, without  evidence of acute or chronic infarction. Areas of hypoattenuation in the right temporal and parietal lobes are likely due to beam hardening artifacts. Vascular: No abnormal hyperdensity of the major intracranial arteries or dural venous sinuses. No intracranial atherosclerosis. Skull: The visualized skull base, calvarium and extracranial soft tissues are normal. Sinuses/Orbits: No fluid levels or advanced mucosal  thickening of the visualized paranasal sinuses. No mastoid or middle ear effusion. The orbits are normal. ASPECTS [facility] Stroke Program Early CT Score) - Ganglionic level infarction (caudate, lentiform nuclei, internal capsule, insula, M1-M3 cortex): 7 - Supraganglionic infarction (M4-M6 cortex): 3 Total score (0-10 with 10 being normal): 10 IMPRESSION: 1. No acute intracranial abnormality. 2. ASPECTS is 10. These results were called by telephone at the time of interpretation on 09/25/2021 at 6:55 pm to provider Roland Rack , who verbally acknowledged these results. Electronically Signed   By: Ulyses Jarred M.D.   On: 09/25/2021 18:57  ? ?CT ANGIO HEAD NECK W WO CM (CODE STROKE) ? ?Result Date: 09/25/2021 ?CLINICAL DATA:  Stroke code, acute neurologic deficit EXAM: CT ANGIOGRAPHY HEAD AND NECK TECHNIQUE: Multidetector CT imaging of the head and neck was performed using the standard protocol during bolus administration of intravenous contrast. Multiplanar CT image reconstructions and MIPs were obtained to evaluate the vascular anatomy. Carotid stenosis measurements (when applicable) are obtained utilizing NASCET criteria, using the distal internal carotid diameter as the denominator. RADIATION DOSE REDUCTION: This exam was performed according to the departmental dose-optimization program which includes automated exposure control, adjustment of the mA and/or kV according to patient size and/or use of iterative reconstruction technique. CONTRAST:  65m OMNIPAQUE IOHEXOL 350 MG/ML SOLN COMPARISON:  No prior CTA, FINDINGS: CT HEAD FINDINGS For noncontrast findings, please see same day CT head. CTA NECK FINDINGS Aortic arch: Standard branching. Imaged portion shows no evidence of aneurysm or dissection. No significant stenosis of the major arch vessel origins. Right carotid system: No evidence of dissection, stenosis (50% or greater) or occlusion. Left carotid system: No evidence of dissection, stenosis (50% or  greater) or occlusion. Vertebral arteries: Codominant. No evidence of dissection, stenosis (50% or greater) or occlusion. Skeleton: No acute osseous abnormality. Other neck: Negative. Upper chest: No focal pulmonary opacity or pleural effusion. Review of the MIP images confirms the above findings CTA HEAD FINDINGS Anterior circulation: Both internal carotid arteries are patent to the termini, with mild noncalcified narrowing of the left proximal supraclinoid segment. A1 segments patent, with a somewhat diminutive left A1. Normal anterior communicating artery. Anterior cerebral arteries are patent to their distal aspects. Right M1 occlusion (series 8, image 104) with minimal reconstitution of a right M2 branch (series 8, image 108-112) and minimal perfusion in the right MCA territory. Left M1 and bifurcation are normal. Distal left MCA branches perfused. Posterior circulation: Vertebral arteries patent to the vertebrobasilar junction without stenosis. Posterior inferior cerebral arteries patent bilaterally. Basilar patent to its distal aspect. Superior cerebellar arteries patent bilaterally. Fetal origin of the right PCA. Patent left P1 segment, with patent left posterior communicating artery. PCAs perfused to their distal aspects without stenosis. Venous sinuses: As permitted by contrast timing, patent. Anatomic variants: Fetal origin of the right PCA. Review of the MIP images confirms the above findings IMPRESSION: 1. Right M1 occlusion, with minimal reconstitution in a right M2 branch and very minimal perfusion in the right MCA territory. 2. No other intracranial large vessel occlusion or significant stenosis. 3.  No hemodynamically significant stenosis in the neck. Code stroke imaging results were communicated  on 09/25/2021 at 7:36 pm to provider Dr. Leonel Ramsay and Dr. Lorrin Goodell via secure text paging. Electronically Signed   By: Merilyn Baba M.D.   On: 09/25/2021 19:43   ? ?Labs: ? ?CBC: ?Recent Labs  ?   09/25/21 ?1840 09/25/21 ?1905 09/25/21 ?2223 09/26/21 ?0542  ?WBC 6.8  --   --  4.6  ?HGB 14.4 14.6 12.6* 12.7*  ?HCT 44.1 43.0 37.0* 37.1*  ?PLT 159  --   --  144*  ? ? ?COAGS: ?Recent Labs  ?  09/25/21 ?1840  ?I

## 2021-09-26 NOTE — TOC CAGE-AID Note (Signed)
Transition of Care (TOC) - CAGE-AID Screening ? ? ?Patient Details  ?Name: Ricardo Jones ?MRN: [id number] ?Date of Birth: 1971/04/07 ? ?Transition of Care (TOC) CM/SW Contact:    ?Lacinda Curvin C Tarpley-Carter, LCSWA ?Phone Number: ?09/26/2021, 3:12 PM ? ? ?Clinical Narrative: ?Pt is unable to participate in Cage Aid. ?Pt was not in room.  CSW will assess at a better time. ? ?Passenger transport manager, MSW, LCSW-A ?Pronouns:  She/Her/Hers ?[facility]itions of [facility] ?Clinical Social Worker ?Direct Number:  [phone] ?[email or web address] ? ?CAGE-AID Screening: ?Substance Abuse Screening unable to be completed due to: : Patient unable to participate ? ?  ?  ?  ?  ?  ? ?  ? ?  ? ? ? ? ? ? ?

## 2021-09-26 NOTE — TOC Benefit Eligibility Note (Signed)
Patient Advocate Encounter ? ?Insurance verification completed.   ? ?The patient is currently admitted and upon discharge could be taking Eliquis 5 mg. ? ?The current 30 day co-pay is, $47.00.  ? ?The patient is currently admitted and upon discharge could be taking Xarelto 20 mg. ? ?The current 30 day co-pay is, $47.00.  ? ?The patient is insured through [employer]  ? ? ? ?Lyndel Safe, CPhT ?Pharmacy Patient Advocate Specialist ?[place] Patient Advocate Team ?Direct Number: [phone]  Fax: [phone] ? ? ? ? ? ?  ?

## 2021-09-26 NOTE — Progress Notes (Signed)
PT Cancellation Note ? ?Patient Details ?Name: Ricardo Jones ?MRN: [id number] ?DOB: 1970/10/25 ? ? ?Cancelled Treatment:    Reason Eval/Treat Not Completed: Active bedrest order. Will check back at later time.  ? ?Leighton Roach, PT  ?Acute Rehab Services ? Pager [phone] ?Office [phone] ? ? ? ?[place] ?09/26/2021, 8:34 AM ?

## 2021-09-26 NOTE — Progress Notes (Signed)
eLink Physician-Brief Progress Note ?Patient Name: Ricardo Jones ?DOB: 30-Sep-1970 ?MRN: [id number] ? ? ?Date of Service ? 09/26/2021  ?HPI/Events of Note ? Patient with urinary retention, bladder scan shows 659 ml of urine retained, patient had TNK within the past 24 hours making repeated in / out bladder catheterization more likely to cause hematuria.  ?eICU Interventions ? Foley catheter ordered.  ? ? ? ?  ? ?Frederik Pear ?09/26/2021, 3:55 AM ?

## 2021-09-26 NOTE — Progress Notes (Signed)
Patient ring given to wife this AM.  Patient cell phone retrieved from CT and given to patient/wife.  ?

## 2021-09-26 NOTE — Progress Notes (Signed)
? ?  Echocardiogram ?2D Echocardiogram has been performed. ? ?Ricardo Jones ?09/26/2021, 9:37 AM ?

## 2021-09-26 NOTE — Progress Notes (Signed)
OT Cancellation Note ? ?Patient Details ?Name: Ricardo Jones ?MRN: [id number] ?DOB: [birth year] ? ? ?Cancelled Treatment:    Reason Eval/Treat Not Completed: Medical issues which prohibited therapy (MRI planned for 12:30; OT evaluation to f/u as appropriate.) ? ?Ernesto Zukowski A [place] ?09/26/2021, 11:06 AM ?

## 2021-09-26 NOTE — Progress Notes (Signed)
eLink Physician-Brief Progress Note ?Patient Name: Ricardo Jones ?DOB: March 12, 1971 ?MRN: [id number] ? ? ?Date of Service ? 09/26/2021  ?HPI/Events of Note ? BS 88, patient is NPO without calories.  ?eICU Interventions ? NS gtt @ 75 ml / hour changed to D 5 NS gtt at 60 ml /hour and Q 4 hourly CBG ordered.  ? ? ? ?  ? ?Frederik Pear ?09/26/2021, 12:27 AM ?

## 2021-09-26 NOTE — Anesthesia Postprocedure Evaluation (Signed)
Anesthesia Post Note ? ?Patient: Ricardo Jones ? ?Procedure(s) Performed: IR WITH ANESTHESIA ? ?  ? ?Patient location during evaluation: SICU ?Anesthesia Type: General ?Level of consciousness: sedated ?Pain management: pain level controlled ?Vital Signs Assessment: post-procedure vital signs reviewed and stable ?Respiratory status: patient remains intubated per anesthesia plan ?Cardiovascular status: stable ?Postop Assessment: no apparent nausea or vomiting ?Anesthetic complications: no ? ? ?No notable events documented. ? ?Last Vitals:  ?Vitals:  ? 09/26/21 0200 09/26/21 0230  ?BP: 117/90 119/89  ?Pulse: 69 70  ?Resp: 18 18  ?Temp:    ?SpO2: 100% 100%  ?  ?Last Pain:  ?Vitals:  ? 09/26/21 0000  ?TempSrc: Oral  ? ? ?  ?  ?  ?  ?  ?  ? ?[place] L[place] ? ? ? ? ?

## 2021-09-26 NOTE — Progress Notes (Signed)
? ?NAME:  Ricardo Jones, MRN:  [id number], DOB:  1971/04/14, LOS: 1 ?ADMISSION DATE:  09/25/2021, CONSULTATION DATE:  09/25/21 ?REFERRING MD:  Leonel Ramsay, CHIEF COMPLAINT:  Acute CVA  ? ?History of Present Illness:  ?Ricardo Jones is a 51 y.o. M with PMH of Atrial fibrillation not on anti-coagulation s/p ICD, ? Cardiac sarcoid who was brought into the ED after experiencing L-sided weakness, AMS and aphasia that began while on an elliptical machine at the gym.  LKW 1807 3/13.  He was given TNK and work-up revealed R MCA M1  occlusion.  He was taken for thrombectomy with TiCi3 revascularization after one pass and post procedure CT brain without ICH.  Post-procedure pt was left intubated and PCCM consulted for ventilator management ? ?Pertinent  Medical History  ? ?Past Medical History:  ?Diagnosis Date  ? Bradycardia   ? asymptomatic  ? S/P dilatation of esophageal stricture   ? Typical atrial flutter ([place])   ? ?Significant Hospital Events: ?Including procedures, antibiotic start and stop dates in addition to other pertinent events   ?3/13 Presented as Code stroke, received TNK and taken for revascularization, left intubated post-procedure ? ?Interim History / Subjective:  ?Overnight, foley placed for urinary retention. This morning, patient remains sedated on propofol and on full vent support.  ? ?Objective   ?Blood pressure 105/76, pulse 69, temperature 97.6 ?F (36.4 ?C), temperature source Axillary, resp. rate 18, height 6' 7.02" (2.007 m), weight 117.9 kg, SpO2 98 %. ?   ?Vent Mode: PRVC ?FiO2 (%):  [30 %-100 %] 30 % ?Set Rate:  [18 bmp] 18 bmp ?Vt Set:  [786 mL] 650 mL ?PEEP:  [5 cmH20] 5 cmH20 ?Plateau Pressure:  [17 cmH20-18 cmH20] 17 cmH20  ? ?Intake/Output Summary (Last 24 hours) at 09/26/2021 0736 ?Last data filed at 09/26/2021 0600 ?Gross per 24 hour  ?Intake 2365.66 ml  ?Output 775 ml  ?Net 1590.66 ml  ? ?Filed Weights  ? 09/25/21 1800 09/25/21 2230  ?Weight: 115.6 kg 117.9 kg  ? ? ?Examination: ?General:  acutely ill appearing middle aged male, sedated and intubated ?HENT: intubated; PERRL ?Lungs: CTAB, no wheezing or rhonchi ?Cardiovascular: RRR, S1 and S2 present, no m/r/g ?Abdomen: nondistended, soft, +BS ?Extremities: warm and dry, no peripheral edema  ?Neuro: sedated; PERRL ? ?Resolved Hospital Problem list   ? ?Assessment & Plan:  ?Acute ischemic stroke with right MCA LVO s/p thrombectomy  ?Presented with left sided weakness, aphasia s/p TNK and right MCA M1 branch thrombectomy. Undergoing stroke work up; lipid profile with elevated LDL and suboptimal HDL.  ?- Neurology following appreciate recommendations  ?- Repeat CT Head wo contrast  ?- MRI Brain and MRA Head today ?- A1c pending ?- PT/OT/ SLP eval once extubated ? ?Acute hypoxic resp failure post thrombectomy requiring vent support ?- Continued on vent support this morning ?- SBT trial once more awake, can likely extubate following  ? 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 In setting of recent embolic stroke, patient will benefit from starting anticoagulation.  ?- Echo pending  ?- Will need to start anticoagulation prior to discharge ? ?Normocytic anemia  ?Thrombocytopenia ?Patient is s/p TKN. No active bleeding at this time.  ?- Trend CBC ? ?Hypokalemia  ?- Replete ?- Trend BMP  ? ?Urinary retention  ?- Maintain foley ? ?Best Practice (right click and "Reselect all SmartList Selections" daily)  ? ?Diet/type: NPO ?DVT prophylaxis: SCD ?GI prophylaxis: PPI ?Lines: N/A ?Foley:  Yes, and it is still needed ?Code Status:  full code ?Last date of multidisciplinary goals of care discussion [3/14 family updated at bedside] ? ?Labs   ?CBC: ?Recent Labs  ?Lab 09/25/21 ?1840 09/25/21 ?1905 09/25/21 ?2223 09/26/21 ?0542  ?WBC 6.8  --   --  4.6  ?NEUTROABS 4.4  --   --  2.6  ?HGB 14.4 14.6 12.6* 12.7*  ?HCT 44.1 43.0 37.0* 37.1*  ?MCV 90.4  --   --  85.9  ?PLT 159  --   --   144*  ? ? ?Basic Metabolic Panel: ?Recent Labs  ?Lab 09/25/21 ?1840 09/25/21 ?1905 09/25/21 ?2223 09/26/21 ?0542  ?NA 140 141 140 140  ?K 4.3 4.4 3.8 3.3*  ?CL 104 105  --  109  ?CO2 25  --   --  23  ?GLUCOSE 102* 97  --  104*  ?BUN 20 24*  --  15  ?CREATININE 1.11 1.10  --  0.89  ?CALCIUM 9.2  --   --  8.4*  ? ?GFR: ?Estimated Creatinine Clearance: 142 mL/min (by C-G formula based on SCr of 0.89 mg/dL). ?Recent Labs  ?Lab 09/25/21 ?1840 09/26/21 ?0542  ?WBC 6.8 4.6  ? ? ?Liver Function Tests: ?Recent Labs  ?Lab 09/25/21 ?1840  ?AST 28  ?ALT 25  ?ALKPHOS 72  ?BILITOT 0.9  ?PROT 6.6  ?ALBUMIN 3.9  ? ?No results for input(s): LIPASE, AMYLASE in the last 168 hours. ?No results for input(s): AMMONIA in the last 168 hours. ? ?ABG ?   ?Component Value Date/Time  ? PHART 7.457 (H) 09/25/2021 2223  ? PCO2ART 37.0 09/25/2021 2223  ? PO2ART 509 (H) 09/25/2021 2223  ? HCO3 26.2 09/25/2021 2223  ? TCO2 27 09/25/2021 2223  ? O2SAT 100 09/25/2021 2223  ?  ? ?Coagulation Profile: ?Recent Labs  ?Lab 09/25/21 ?1840  ?INR 1.1  ? ? ?Cardiac Enzymes: ?No results for input(s): CKTOTAL, CKMB, CKMBINDEX, TROPONINI in the last 168 hours. ? ?HbA1C: ?No results found for: HGBA1C ? ?CBG: ?Recent Labs  ?Lab 09/25/21 ?1838 09/25/21 ?2331 09/26/21 ?5462  ?GLUCAP 90 88 110*  ? ? ?Critical care time: 35 minutes ?  ? ? ? ? ? ?

## 2021-09-26 NOTE — Evaluation (Signed)
Physical Therapy Evaluation ?Patient Details ?Name: Ricardo Jones ?MRN: [id number] ?DOB: [birth year] ?Today's Date: 09/26/2021 ? ?History of Present Illness ? Pt is 51 yo male who presents with R gaze deviation, aphasia, and L side weakness [place] [facility] [place] [employer] Received TNK and underwent revascularization R MCA.  PMH: bradycardia, a flutter, CHF, cardiac sarcoidosis  ?Clinical Impression ? Pt admitted with above diagnosis. Pt received in bed, wife present. Pt able to come to EOB without assist, mildly increased time for problem solving. On MMT, LLE mildly weaker than R (4+/5 compared to 5/5). Pt ambulated 80' with min to min guard A. High level balance not challenged today as pt had just come off bedrest. Pt with 1.5-2 min delay in answering questions and tends to not try to speak rather than speak and get something wrong. Anticipate pt being able to return home with family and receive oupt therapies. Pt currently with functional limitations due to the deficits listed below (see PT Problem List). Pt will benefit from skilled PT to increase their independence and safety with mobility to allow discharge to the venue listed below.   ?   ?   ? ?Recommendations for follow up therapy are one component of a multi-disciplinary discharge planning process, led by the attending physician.  Recommendations may be updated based on patient status, additional functional criteria and insurance authorization. ? ?Follow Up Recommendations Outpatient PT ? ?  ?Assistance Recommended at Discharge Intermittent Supervision/Assistance  ?Patient can return home with the following ? A little help with walking and/or transfers;Direct supervision/assist for medications management;Direct supervision/assist for financial management;Assist for transportation;Help with stairs or ramp for entrance ? ?  ?Equipment Recommendations None recommended by PT  ?Recommendations for Other Services ? OT consult;Speech consult  ?  ?Functional  Status Assessment Patient has had a recent decline in their functional status and demonstrates the ability to make significant improvements in function in a reasonable and predictable amount of time.  ? ?  ?Precautions / Restrictions Precautions ?Precautions: Fall ?Restrictions ?Weight Bearing Restrictions: No  ? ?  ? ?Mobility ? Bed Mobility ?Overal bed mobility: Needs Assistance ?Bed Mobility: Supine to Sit ?  ?  ?Supine to sit: Supervision ?  ?  ?General bed mobility comments: pt able to come to EOB without physical assist, mildly increased time needed ?  ? ?Transfers ?Overall transfer level: Needs assistance ?Equipment used: None ?Transfers: Sit to/from Stand ?Sit to Stand: Supervision ?  ?  ?  ?  ?  ?General transfer comment: supervision for safety, pt denies dizziness, no LOB ?  ? ?Ambulation/Gait ?Ambulation/Gait assistance: Min assist, Min guard ?Gait Distance (Feet): 80 Feet ?Assistive device: IV Pole, None ?Gait Pattern/deviations: Step-through pattern ?Gait velocity: decreased ?Gait velocity interpretation: >2.62 ft/sec, indicative of community ambulatory ?  ?General Gait Details: cautious gait, began pushing IV pole but then no AD. Did not challenge high level balance today but no LOB with turning or turning head while ambulating ? ?Stairs ?  ?  ?  ?  ?  ? ?Wheelchair Mobility ?  ? ?Modified Rankin (Stroke Patients Only) ?Modified Rankin (Stroke Patients Only) ?Pre-Morbid Rankin Score: No symptoms ?Modified Rankin: Moderate disability ? ?  ? ?Balance Overall balance assessment: Needs assistance ?Sitting-balance support: No upper extremity supported, Feet supported ?Sitting balance-Leahy Scale: Normal ?  ?  ?Standing balance support: No upper extremity supported ?Standing balance-Leahy Scale: Good ?Standing balance comment: will further assess high level balance and LLE balance next session ?  ?  ?  ?  ?  ?  ?  ?  ?  ?  ?  ?   ? ? ? ?  Pertinent Vitals/Pain Pain Assessment ?Pain Assessment: No/denies pain   ? ? ?Home Living Family/patient expects to be discharged to:: Private residence ?Living Arrangements: Spouse/significant other;Children ?Available Help at Discharge: Family;Available PRN/intermittently ?Type of Home: House ?Home Access: Stairs to enter ?  ?Entrance Stairs-Number of Steps: 14 ?Alternate Level Stairs-Number of Steps: flight ?Home Layout: Multi-level ?Home Equipment: None ?Additional Comments: lives with wife and 4 kids, 54 to teenage  ?  ?Prior Function Prior Level of Function : Independent/Modified Independent;Working/employed;Driving ?  ?  ?  ?  ?  ?  ?Mobility Comments: works as an Control and instrumentation engineer, works out at the [employer] 3 days/wk ?  ?  ? ? ?Hand Dominance  ? Dominant Hand: Right ? ?  ?Extremity/Trunk Assessment  ? Upper Extremity Assessment ?Upper Extremity Assessment: Defer to OT evaluation ?  ? ?Lower Extremity Assessment ?Lower Extremity Assessment: LLE deficits/detail ?LLE Deficits / Details: LLE mildly weaker than R, 4+/5 compared to 5/5. Pt states he cannot tell a difference with MMT but PT unable to decipher if he is unaware of the deficit or his speech deficits are affecting his answering "no" ?LLE Sensation: WNL ?LLE Coordination: WNL ?  ? ?Cervical / Trunk Assessment ?Cervical / Trunk Assessment: Normal  ?Communication  ? Communication: Receptive difficulties;Expressive difficulties  ?Cognition Arousal/Alertness: Awake/alert ?Behavior During Therapy: Flat affect ?Overall Cognitive Status: Difficult to assess ?Area of Impairment: Following commands, Problem solving, Orientation, Safety/judgement ?  ?  ?  ?  ?  ?  ?  ?  ?Orientation Level: Disoriented to, Time ?  ?  ?Following Commands: Follows one step commands with increased time, Follows multi-step commands with increased time ?Safety/Judgement: Decreased awareness of deficits ?  ?Problem Solving: Slow processing, Requires verbal cues ?General Comments: has 1.5-2 min delay in answering questions but often answers correctly when given enough  time. ?  ?  ? ?  ?General Comments General comments (skin integrity, edema, etc.): VSS on RA. Wife present. Reviewed stroke sxs ? ?  ?Exercises    ? ?Assessment/Plan  ?  ?PT Assessment Patient needs continued PT services  ?PT Problem List Decreased strength;Decreased balance;Decreased mobility ? ?   ?  ?PT Treatment Interventions Gait training;Stair training;Functional mobility training;Therapeutic activities;Therapeutic exercise;Balance training;Patient/family education;Cognitive remediation   ? ?PT Goals (Current goals can be found in the Care Plan section)  ?Acute Rehab PT Goals ?Patient Stated Goal: pt did not state ?PT Goal Formulation: With patient/family ?Time For Goal Achievement: 10/10/21 ?Potential to Achieve Goals: Good ? ?  ?Frequency Min 4X/week ?  ? ? ?Co-evaluation   ?  ?  ?  ?  ? ? ?  ?AM-PAC PT "6 Clicks" Mobility  ?Outcome Measure Help needed turning from your back to your side while in a flat bed without using bedrails?: None ?Help needed moving from lying on your back to sitting on the side of a flat bed without using bedrails?: None ?Help needed moving to and from a bed to a chair (including a wheelchair)?: None ?Help needed standing up from a chair using your arms (e.g., wheelchair or bedside chair)?: A Little ?Help needed to walk in hospital room?: A Little ?Help needed climbing 3-5 steps with a railing? : A Little ?6 Click Score: 21 ? ?  ?End of Session Equipment Utilized During Treatment: Gait belt ?Activity Tolerance: Patient tolerated treatment well ?Patient left: in chair;with call bell/phone within reach;with family/visitor present ?Nurse Communication: Mobility status ?PT Visit Diagnosis: Unsteadiness on feet (R26.81) ?  ? ?Time: 4166-0630 ?PT Time Calculation (  min) (ACUTE ONLY): 33 min ? ? ?Charges:   PT Evaluation ?$PT Eval Moderate Complexity: 1 Mod ?PT Treatments ?$Gait Training: 8-22 mins ?  ?   ? ? ?Leighton Roach, PT  ?Acute Rehab Services ? Pager [phone] ?Office  [phone] ? ? ?[place] ?09/26/2021, 5:36 PM ? ?

## 2021-09-26 NOTE — Progress Notes (Signed)
SLP Cancellation Note ? ?Patient Details ?Name: Ricardo Jones ?MRN: [id number] ?DOB: [birth year] ? ? ?Cancelled treatment:       Reason Eval/Treat Not Completed: Patient not medically ready ? ? ?[name], Katherene Ponto ?09/26/2021, 7:52 AM ?

## 2021-09-26 NOTE — Progress Notes (Addendum)
STROKE TEAM PROGRESS NOTE  ? ?INTERVAL HISTORY ?Patient is seen in his room with one family member at the bedside.  He has recently been extubated.  He has been hemodynamically stable overnight.  Yesterday, he became unresponsive while at the gym.  EMS was called, and patient presented with a right gaze deviation, left-sided weakness and inability to speak or follow commands.  Right sided M1 occlusion was found, TNK was given, and patient was taken for thrombectomy.  TICI 3 flow was achieved in right M1 with one pass.   ? ?Vitals:  ? 09/26/21 0900 09/26/21 0950 09/26/21 1000 09/26/21 1100  ?BP: 120/83  106/74 124/89  ?Pulse: 73 77 72 72  ?Resp: '15 15 19 20  '$ ?Temp:      ?TempSrc:      ?SpO2: 98% 99% 99% 98%  ?Weight:      ?Height:      ? ?CBC:  ?Recent Labs  ?Lab 09/25/21 ?1840 09/25/21 ?1905 09/25/21 ?2223 09/26/21 ?0542  ?WBC 6.8  --   --  4.6  ?NEUTROABS 4.4  --   --  2.6  ?HGB 14.4   < > 12.6* 12.7*  ?HCT 44.1   < > 37.0* 37.1*  ?MCV 90.4  --   --  85.9  ?PLT 159  --   --  144*  ? < > = values in this interval not displayed.  ? ?Basic Metabolic Panel:  ?Recent Labs  ?Lab 09/25/21 ?1840 09/25/21 ?1905 09/25/21 ?2223 09/26/21 ?0542  ?NA 140 141 140 140  ?K 4.3 4.4 3.8 3.3*  ?CL 104 105  --  109  ?CO2 25  --   --  23  ?GLUCOSE 102* 97  --  104*  ?BUN 20 24*  --  15  ?CREATININE 1.11 1.10  --  0.89  ?CALCIUM 9.2  --   --  8.4*  ? ?Lipid Panel:  ?Recent Labs  ?Lab 09/26/21 ?0542  ?CHOL 155  ?TRIG 96  92  ?HDL 36*  ?CHOLHDL 4.3  ?VLDL 19  ?LDLCALC 100*  ? ?HgbA1c: No results for input(s): HGBA1C in the last 168 hours. ?Urine Drug Screen:  ?Recent Labs  ?Lab 09/26/21 ?0423  ?LABOPIA NONE DETECTED  ?COCAINSCRNUR NONE DETECTED  ?LABBENZ NONE DETECTED  ?AMPHETMU NONE DETECTED  ?THCU NONE DETECTED  ?LABBARB NONE DETECTED  ?  ?Alcohol Level  ?Recent Labs  ?Lab 09/25/21 ?2150  ?ETH <10  ? ? ?IMAGING past 24 hours ?DG CHEST PORT 1 VIEW ? ?Result Date: 09/25/2021 ?CLINICAL DATA:  Endotracheal tube placement EXAM: PORTABLE CHEST  1 VIEW COMPARISON:  09/09/2018 FINDINGS: Endotracheal tube has been placed with tip measuring 5 cm above the carina. A cardiac pacemaker is present. Cardiac enlargement without vascular congestion or edema. Linear atelectasis or infiltration demonstrated in both upper lungs. No pleural effusions. No pneumothorax. IMPRESSION: Endotracheal tube appears in satisfactory position measuring 5 cm above the carina. Cardiac enlargement. Linear areas of atelectasis or infiltration in the upper lungs. Electronically Signed   By: Lucienne Capers M.D.   On: 09/25/2021 23:15  ? ?ECHOCARDIOGRAM COMPLETE ? ?Result Date: 09/26/2021 ?   ECHOCARDIOGRAM REPORT   Patient Name:   Ricardo Jones Date of Exam: 09/26/2021 Medical Rec #:  [id number]      Height:       79.0 in Accession #:    [id number]     Weight:       259.9 lb Date of Birth:  1971/04/20     BSA:  2.547 m? Patient Age:    51 years       BP:           105/76 mmHg Patient Gender: M              HR:           70 bpm. Exam Location:  Inpatient Procedure: 2D Echo, Cardiac Doppler and Color Doppler Indications:    I63.9 /434.91 STROKE  History:        Patient has prior history of Echocardiogram examinations, most                 recent 08/03/2021. Arrythmias:Bradycardia and Atrial                 Fibrillation.  Sonographer:    Beryle Beams Referring Phys: [email or web address].Osler MCNEILL P [place]  1. No left ventricular thrpmbus is seen (Definity was not administered). There is marked septal-lateral dyssynchrony. Left ventricular ejection fraction, by estimation, is 30 to 35%. The left ventricle has moderately decreased function. The left ventricle demonstrates global hypokinesis. The left ventricular internal cavity size was moderately dilated. Left ventricular diastolic function could not be evaluated.  2. Right ventricular systolic function is normal. The right ventricular size is mildly enlarged. Device lead is visualized in the right ventricle and in the coronary sinus.  Tricuspid regurgitation signal is inadequate for assessing PA pressure.  3. Left atrial size was severely dilated.  4. Right atrial size was severely dilated.  5. The mitral valve is normal in structure. Trivial mitral valve regurgitation.  6. The aortic valve is tricuspid. Aortic valve regurgitation is not visualized. No aortic stenosis is present.  7. The inferior vena cava is dilated in size with <50% respiratory variability, suggesting right atrial pressure of 15 mmHg. Comparison(s): A prior study was performed on 08/03/2021. The left ventricular function is significantly worse. Atrial fibrillation is new since the January 2023 study. The degree of LV dyssynchrony is worse. The paced QRS complex appears broader (155 ms  versus 123 ms in January), suggesting loss of LV lead capture. In addition there are frequent PVCs or native conducted beats with broad QRS complex, not seen on the previos study. Conclusion(s)/Recommendation(s): Recommend CRT device interrogation and assessment of LV capture. FINDINGS  Left Ventricle: No left ventricular thrpmbus is seen (Definity was not administered). There is marked septal-lateral dyssynchrony. Left ventricular ejection fraction, by estimation, is 30 to 35%. The left ventricle has moderately decreased function. The  left ventricle demonstrates global hypokinesis. The left ventricular internal cavity size was moderately dilated. There is no left ventricular hypertrophy. Abnormal (paradoxical) septal motion, consistent with RV pacemaker. Left ventricular diastolic function could not be evaluated due to atrial fibrillation. Left ventricular diastolic function could not be evaluated. Right Ventricle: The right ventricular size is mildly enlarged. No increase in right ventricular wall thickness. Right ventricular systolic function is normal. Tricuspid regurgitation signal is inadequate for assessing PA pressure. Left Atrium: Left atrial size was severely dilated. Right Atrium:  Right atrial size was severely dilated. Pericardium: Trivial pericardial effusion is present. Mitral Valve: The mitral valve is normal in structure. Trivial mitral valve regurgitation. Tricuspid Valve: The tricuspid valve is grossly normal. Tricuspid valve regurgitation is not demonstrated. Aortic Valve: The aortic valve is tricuspid. Aortic valve regurgitation is not visualized. No aortic stenosis is present. Aortic valve mean gradient measures 5.0 mmHg. Aortic valve peak gradient measures 10.2 mmHg. Aortic valve area, by VTI measures 1.21  cm?. Pulmonic Valve: The  pulmonic valve was grossly normal. Pulmonic valve regurgitation is not visualized. Aorta: The aortic root and ascending aorta are structurally normal, with no evidence of dilitation. Venous: The inferior vena cava is dilated in size with less than 50% respiratory variability, suggesting right atrial pressure of 15 mmHg. IAS/Shunts: No atrial level shunt detected by color flow Doppler. Additional Comments: A device lead is visualized in the right ventricle and coronary sinus.  LEFT VENTRICLE PLAX 2D LVIDd:         6.20 cm LVIDs:         4.90 cm LV PW:         1.20 cm LV IVS:        0.90 cm LVOT diam:     1.90 cm LV SV:         37 LV SV Index:   15 LVOT Area:     2.84 cm?  LV Volumes (MOD) LV vol d, MOD A4C: 145.0 ml LV vol s, MOD A4C: 110.5 ml LV SV MOD A4C:     34.5 ml RIGHT VENTRICLE             IVC RV Basal diam:  4.69 cm     IVC diam: 2.80 cm RV S prime:     12.30 cm/s TAPSE (M-mode): 1.4 cm LEFT ATRIUM              Index        RIGHT ATRIUM           Index LA diam:        4.60 cm  1.81 cm/m?   RA Area:     26.70 cm? LA Vol (A2C):   109.0 ml 42.79 ml/m?  RA Volume:   92.40 ml  36.27 ml/m? LA Vol (A4C):   81.8 ml  32.11 ml/m? LA Biplane Vol: 95.5 ml  37.49 ml/m?  AORTIC VALVE                     PULMONIC VALVE AV Area (Vmax):    1.11 cm?      PV Vmax:       0.41 m/s AV Area (Vmean):   1.16 cm?      PV Vmean:      25.200 cm/s AV Area (VTI):     1.21 cm?       PV VTI:        0.096 m AV Vmax:           160.00 cm/s   PV Peak grad:  0.7 mmHg AV Vmean:          103.000 cm/s  PV Mean grad:  0.0 mmHg AV VTI:            0.307 m AV Peak Grad:      10.2 mmHg AV Me

## 2021-09-26 NOTE — ED Provider Notes (Signed)
?[facility] 4NORTH NEURO/TRAUMA/SURGICAL ICU ?Provider Note ? ?CSN: [id number] ?Arrival date & time: 09/25/21 1835 ? ?Chief Complaint(s) ?Code Stroke ? ?HPI ?Ricardo Jones is a 51 y.o. male who presents emergency department as a stroke alert for right gaze palsy, aphasia, left-sided weakness.  Patient with history of a flutter not on anticoagulation with last known well 1807.  Patient arrives with a NIH greater than 20.  Unable to give a review of systems as he is aphasic. ? ?HPI ? ?Past Medical History ?Past Medical History:  ?Diagnosis Date  ? Bradycardia   ? asymptomatic  ? S/P dilatation of esophageal stricture   ? Typical atrial flutter ([place])   ? ?Patient Active Problem List  ? Diagnosis Date Noted  ? 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 Acute respiratory insufficiency, postoperative   ? Cardiac resynchronization therapy defibrillator (CRT-D) in place 09/10/2019  ? 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 Complete heart block ([place]) 09/07/2018  ? Chronic systolic CHF (congestive heart failure) ([place]) 07/07/2018  ? 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 Tinea pedis of both feet 02/12/2018  ? Onychomycosis 02/12/2018  ? Chronic atrial flutter ([place]) 11/01/2016  ? ATRIAL FLUTTER 09/28/2010  ? BRADYCARDIA, CHRONIC 09/28/2010  ? ?Home Medication(s) ?Prior to Admission medications   ?Medication Sig Start Date End Date Taking? Authorizing Provider  ?PREVIDENT 5000 BOOSTER PLUS 1.1 % PSTE as directed. 09/23/18   [provider]  ?                                                                                                                                  ?Past Surgical History ?Past Surgical History:  ?Procedure Laterality Date  ? BIV ICD INSERTION CRT-D N/A 09/08/2018  ? Procedure: BIV ICD INSERTION CRT-D;  Surgeon: Evans Lance, MD;  Location: [place] [facility];  Service: Cardiovascular;  Laterality: N/A;  ? THROAT SURGERY  2012  ? ?Family  History ?Family History  ?Problem Relation Age of Onset  ? Bradycardia Other   ?     multiple family members with pacemakers  ? Asthma Mother   ? Cancer Maternal Grandmother   ? Cancer Maternal Grandfather   ? Cancer Paternal Grandmother   ? ? ?Social History ?Social History  ? ?Tobacco Use  ? Smoking status: Never  ? Smokeless tobacco: Never  ?Vaping Use  ? Vaping Use: Never used  ?Substance Use Topics  ? Alcohol use: Yes  ?  Comment: occassionally  ? Drug use: No  ? ?Allergies ?Patient has no known allergies. ? ?Review of Systems ?Review of Systems  ?Unable to perform ROS: Patient nonverbal  ? ?Physical Exam ?Vital Signs  ?I have reviewed the triage vital signs ?BP (!) 122/92   Pulse 69   Temp (!) 97.4 ?F (36.3 ?C) (Oral)   Resp 11   Ht 6' 7.02" (2.007 m)   Wt 117.9 kg  SpO2 100%   BMI 29.27 kg/m?  ? ?Physical Exam ?Vitals and nursing note reviewed.  ?Constitutional:   ?   General: He is not in acute distress. ?   Appearance: He is well-developed. He is ill-appearing.  ?HENT:  ?   Head: Normocephalic and atraumatic.  ?Eyes:  ?   Conjunctiva/sclera: Conjunctivae normal.  ?Cardiovascular:  ?   Rate and Rhythm: Normal rate and regular rhythm.  ?   Heart sounds: No murmur heard. ?Pulmonary:  ?   Effort: Pulmonary effort is normal. No respiratory distress.  ?   Breath sounds: Normal breath sounds.  ?Abdominal:  ?   Palpations: Abdomen is soft.  ?   Tenderness: There is no abdominal tenderness.  ?Musculoskeletal:     ?   General: No swelling.  ?   Cervical back: Neck supple.  ?Skin: ?   General: Skin is warm and dry.  ?   Capillary Refill: Capillary refill takes less than 2 seconds.  ?Neurological:  ?   Mental Status: He is alert.  ?   Cranial Nerves: Cranial nerve deficit present.  ?   Sensory: Sensory deficit present.  ?   Motor: Weakness present.  ?Psychiatric:     ?   Mood and Affect: Mood normal.  ? ? ?ED Results and Treatments ?Labs ?(all labs ordered are listed, but only abnormal results are  displayed) ?Labs Reviewed  ?COMPREHENSIVE METABOLIC PANEL - Abnormal; Notable for the following components:  ?    Result Value  ? Glucose, Bld 102 (*)   ? All other components within normal limits  ?I-STAT CHEM 8, ED - Abnormal; Notable for the following components:  ? BUN 24 (*)   ? Calcium, Ion 1.14 (*)   ? All other components within normal limits  ?POCT I-STAT 7, (LYTES, BLD GAS, ICA,H+H) - Abnormal; Notable for the following components:  ? pH, Arterial 7.457 (*)   ? pO2, Arterial 509 (*)   ? HCT 37.0 (*)   ? Hemoglobin 12.6 (*)   ? All other components within normal limits  ?RESP PANEL BY RT-PCR (FLU A&B, COVID) ARPGX2  ?MRSA NEXT GEN BY PCR, NASAL  ?ETHANOL  ?PROTIME-INR  ?APTT  ?CBC  ?DIFFERENTIAL  ?HIV ANTIBODY (ROUTINE TESTING W REFLEX)  ?GLUCOSE, CAPILLARY  ?RAPID URINE DRUG SCREEN, HOSP PERFORMED  ?URINALYSIS, ROUTINE W REFLEX MICROSCOPIC  ?HEMOGLOBIN A1C  ?LIPID PANEL  ?BLOOD GAS, ARTERIAL  ?CBC WITH DIFFERENTIAL/PLATELET  ?BASIC METABOLIC PANEL  ?TRIGLYCERIDES  ?CBG MONITORING, ED  ?                                                                                                                       ? ?Radiology ?DG CHEST PORT 1 VIEW ? ?Result Date: 09/25/2021 ?CLINICAL DATA:  Endotracheal tube placement EXAM: PORTABLE CHEST 1 VIEW COMPARISON:  09/09/2018 FINDINGS: Endotracheal tube has been placed with tip measuring 5 cm above the carina. A cardiac pacemaker is present. Cardiac enlargement without vascular congestion or edema. Linear  atelectasis or infiltration demonstrated in both upper lungs. No pleural effusions. No pneumothorax. IMPRESSION: Endotracheal tube appears in satisfactory position measuring 5 cm above the carina. Cardiac enlargement. Linear areas of atelectasis or infiltration in the upper lungs. Electronically Signed   By: Lucienne Capers M.D.   On: 09/25/2021 23:15  ? ?CT HEAD CODE STROKE WO CONTRAST ? ?Result Date: 09/25/2021 ?CLINICAL DATA:  Code stroke.  Acute neurologic deficit EXAM:  CT HEAD WITHOUT CONTRAST TECHNIQUE: Contiguous axial images were obtained from the base of the skull through the vertex without intravenous contrast. RADIATION DOSE REDUCTION: This exam was performed according to the departmental dose-optimization program which includes automated exposure control, adjustment of the mA and/or kV according to patient size and/or use of iterative reconstruction technique. COMPARISON:  None. FINDINGS: Brain: There is no mass, hemorrhage or extra-axial collection. The size and configuration of the ventricles and extra-axial CSF spaces are normal. The brain parenchyma is normal, without evidence of acute or chronic infarction. Areas of hypoattenuation in the right temporal and parietal lobes are likely due to beam hardening artifacts. Vascular: No abnormal hyperdensity of the major intracranial arteries or dural venous sinuses. No intracranial atherosclerosis. Skull: The visualized skull base, calvarium and extracranial soft tissues are normal. Sinuses/Orbits: No fluid levels or advanced mucosal thickening of the visualized paranasal sinuses. No mastoid or middle ear effusion. The orbits are normal. ASPECTS [facility] Stroke Program Early CT Score) - Ganglionic level infarction (caudate, lentiform nuclei, internal capsule, insula, M1-M3 cortex): 7 - Supraganglionic infarction (M4-M6 cortex): 3 Total score (0-10 with 10 being normal): 10 IMPRESSION: 1. No acute intracranial abnormality. 2. ASPECTS is 10. These results were called by telephone at the time of interpretation on 09/25/2021 at 6:55 pm to provider Roland Rack , who verbally acknowledged these results. Electronically Signed   By: Ulyses Jarred M.D.   On: 09/25/2021 18:57  ? ?CT ANGIO HEAD NECK W WO CM (CODE STROKE) ? ?Result Date: 09/25/2021 ?CLINICAL DATA:  Stroke code, acute neurologic deficit EXAM: CT ANGIOGRAPHY HEAD AND NECK TECHNIQUE: Multidetector CT imaging of the head and neck was performed using the standard  protocol during bolus administration of intravenous contrast. Multiplanar CT image reconstructions and MIPs were obtained to evaluate the vascular anatomy. Carotid stenosis measurements (when applicable) are obtained utilizing NASCET criter

## 2021-09-26 NOTE — Progress Notes (Signed)
?  Transition of Care (TOC) Screening Note ? ? ?Patient Details  ?Name: Ricardo Jones ?Date of Birth: 1971-06-27 ? ? ?Transition of Care (TOC) CM/SW Contact:    ?Benard Halsted, LCSW ?Phone Number: ?09/26/2021, 9:28 AM ? ? ? ?Transition of Care Department [facility]) has reviewed patient and no TOC needs have been identified at this time. We will continue to monitor patient advancement through interdisciplinary progression rounds. If new patient transition needs arise, please place a TOC consult. ? ? ?

## 2021-09-26 NOTE — Progress Notes (Signed)
Nutrition Brief Note ? ?Patient previously ventilated. Has since been extubated.  ? 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 Pt was eating well PTA. Denies changes in appetite or PO intake. Eats 3 meals per day consisting of peanut butter and oatmeal for breakfast, chicken salad for lunch and various items for dinner, usually vegetables and protein.  ? ?Pt reports a usual weight of 240 lbs and denies recent weight loss. ? ?Body mass index is 29.27 kg/m?Marland Kitchen Patient meets criteria for overweight based on current BMI.  ? ?Current diet order is Heart Healthy. Labs and medications reviewed.  ? ?No nutrition interventions warranted at this time. If nutrition issues arise, please consult RD.  ? ?Ricardo Jones, RDN, LDN ?Clinical Nutrition ? ? ?

## 2021-09-26 NOTE — H&P (Signed)
Neurology Consultation ? ?Reason for Consult: Code Stroke ?Referring Physician: Dr. Matilde Sprang ? ?CC: Patient is unable to provide a chief complaint due to aphasia on arrival. Per EMS, aphasia, left-sided weakness ? ?History is obtained from: EMS, unable to obtain from patient due to aphasia, chart review, patient's wife via telephone ? ?HPI: Ricardo Jones is a 51 y.o. male with a medical history significant for atrial flutter not on anticoagulation due to CHA2DS2-VASc score of 1 and presumed cardiac sarcoid who presented to the ED via EMS from the [facility].  Patient was on the elliptical when a bystander noticed that he was unresponsive and acting "off".  The bystander assisted patient off the elliptical and activated EMS.  On EMS arrival patient had a right gaze, was nonverbal, did not follow commands, and had left-sided weakness and was activated as a code stroke for further evaluation. ? ?His wife reports that he has been completely normal, no recent illnesses or other concerns. ? ?LKW: Patient seen walking to the gym at 18:07, acting normally ?TNK given?:  Yes ?IR Thrombectomy?  Yes ?Modified Rankin Scale: 0-Completely asymptomatic and back to baseline post- stroke ? ?OHY:WVPXTG to obtain due to altered mental status.  ? ?Past Medical History:  ?Diagnosis Date  ? Bradycardia   ? asymptomatic  ? S/P dilatation of esophageal stricture   ? Typical atrial flutter ([place])   ? ?Past Surgical History:  ?Procedure Laterality Date  ? BIV ICD INSERTION CRT-D N/A 09/08/2018  ? Procedure: BIV ICD INSERTION CRT-D;  Surgeon: Evans Lance, MD;  Location: [place] [facility];  Service: Cardiovascular;  Laterality: N/A;  ? THROAT SURGERY  2012  ? ?Family History  ?Problem Relation Age of Onset  ? Bradycardia Other   ?     multiple family members with pacemakers  ? Asthma Mother   ? Cancer Maternal Grandmother   ? Cancer Maternal Grandfather   ? Cancer Paternal Grandmother   ? ?Social History:  ? reports that he has never smoked. He has  never used smokeless tobacco. He reports current alcohol use. He reports that he does not use drugs. ? ?Medications ? ?Current Facility-Administered Medications:  ?  acetaminophen (TYLENOL) tablet 650 mg, 650 mg, Oral, Q4H PRN **OR** acetaminophen (TYLENOL) 160 MG/5ML solution 650 mg, 650 mg, Per Tube, Q4H PRN **OR** acetaminophen (TYLENOL) suppository 650 mg, 650 mg, Rectal, Q4H PRN, Greta Doom, MD ?  ceFAZolin (ANCEF) 2-4 GM/100ML-% IVPB, , , ,  ?  chlorhexidine gluconate (MEDLINE KIT) (PERIDEX) 0.12 % solution 15 mL, 15 mL, Mouth Rinse, BID, Donnetta Simpers, MD, 15 mL at 09/25/21 2201 ?  Chlorhexidine Gluconate Cloth 2 % PADS 6 each, 6 each, Topical, Daily, Donnetta Simpers, MD ?  clevidipine (CLEVIPREX) infusion 0.5 mg/mL, 0-21 mg/hr, Intravenous, Continuous, Gleason, Otilio Carpen, PA-C ?  dextrose 5 %-0.9 % sodium chloride infusion, , Intravenous, Continuous, Ogan, Okoronkwo U, MD, Last Rate: 60 mL/hr at 09/26/21 0600, Infusion Verify at 09/26/21 0600 ?  docusate (COLACE) 50 MG/5ML liquid 100 mg, 100 mg, Per Tube, BID, Gleason, Otilio Carpen, PA-C ?  fentaNYL (SUBLIMAZE) 100 MCG/2ML injection, , , ,  ?  fentaNYL (SUBLIMAZE) injection 50 mcg, 50 mcg, Intravenous, Q15 min PRN, Gleason, Otilio Carpen, PA-C ?  fentaNYL (SUBLIMAZE) injection 50-200 mcg, 50-200 mcg, Intravenous, Q30 min PRN, Gleason, Otilio Carpen, PA-C ?  labetalol (NORMODYNE) injection 10 mg, 10 mg, Intravenous, Once PRN **AND** nicardipine (CARDENE) 3m in 0.86% saline 2045mIV infusion (0.1 mg/ml), 0-15 mg/hr, Intravenous, Continuous  PRN, Greta Doom, MD ?  MEDLINE mouth rinse, 15 mL, Mouth Rinse, 10 times per day, Donnetta Simpers, MD, 15 mL at 09/26/21 0400 ?  pantoprazole (PROTONIX) injection 40 mg, 40 mg, Intravenous, QHS, Dawaun Brancato, Vida Roller, MD ?  polyethylene glycol (MIRALAX / GLYCOLAX) packet 17 g, 17 g, Per Tube, Daily, Gleason, Otilio Carpen, PA-C ?  propofol (DIPRIVAN) 1000 MG/100ML infusion, 5-80 mcg/kg/min, Intravenous,  Titrated, Ogan, Okoronkwo U, MD, Last Rate: 17.69 mL/hr at 09/26/21 0600, 25 mcg/kg/min at 09/26/21 0600 ? ?Exam: ?Current vital signs: ?BP 111/75   Pulse 68   Temp 97.6 ?F (36.4 ?C) (Axillary)   Resp 10   Ht 6' 7.02" (2.007 m)   Wt 117.9 kg   SpO2 98%   BMI 29.27 kg/m?  ?Vital signs in last 24 hours: ?Temp:  [96.2 ?F (35.7 ?C)-97.6 ?F (36.4 ?C)] 97.6 ?F (36.4 ?C) (03/14 0500) ?Pulse Rate:  [65-72] 68 (03/14 0600) ?Resp:  [6-20] 10 (03/14 0600) ?BP: (111-150)/(75-101) 111/75 (03/14 0600) ?SpO2:  [98 %-100 %] 98 % (03/14 0600) ?Arterial Line BP: (109-148)/(65-94) 109/65 (03/14 0600) ?FiO2 (%):  [30 %-100 %] 30 % (03/14 0320) ?Weight:  [115.6 kg-117.9 kg] 117.9 kg (03/13 2230) ? ?GENERAL: Awake, alert, in no acute distress ?Psych: Unable to assess due to patient's condition ?Head: Normocephalic and atraumatic, without obvious abnormality ?EENT: Normal conjunctivae, dry mucous membranes, no OP obstruction ?LUNGS: Normal respiratory effort. Non-labored breathing on room air ?CV: Regular rate and rhythm on telemetry ?ABDOMEN: Soft, non-tender, non-distended ?Extremities: Warm, well perfused, without obvious deformity ? ?NEURO:  ?Mental Status: Awake, alert, globally aphasic. ?Patient does not verbalize, does not follow commands, does not answer orientation questions. ?Cranial Nerves:  ?II: PERRL.  ?III, IV, VI: Patient has a right gaze preference and is unable to cross midline towards the left. ?V: Does not blink to threat on the left visual fields. ?VII: Face is asymmetric with complete left facial paralysis with grimace ?VIII: Unable to assess due to patient's condition ?IX, X: Patient does not allow for evaluation of soft palate. ?XI: Head is grossly midline ?XII: Does not protrude tongue to command ?Motor: Right upper extremity is able to elevate antigravity without vertical drift with constant coaching. ?Right lower extremity has some antigravity movement but patient is unable to follow commands to sustain  antigravity movement. ?Left upper lower extremities withdraw to noxious stimuli. ?Tone is normal. Bulk is normal.  ?Sensation: Patient grimaces and withdraws to noxious stimuli throughout. ?Coordination: Does not perform. ?Gait: Deferred ? ?NIHSS: ?1a Level of Conscious.: 0 ?1b LOC Questions: 2 ?1c LOC Commands: 2 ?2 Best Gaze: 1 ?3 Visual: 2 ?4 Facial Palsy: 2 ?5a Motor Arm - left: 3 ?5b Motor Arm - Right: 0 ?6a Motor Leg - Left: 3 ?6b Motor Leg - Right: 2 ?7 Limb Ataxia: 0 ?8 Sensory: 0 ?9 Best Language: 3 ?10 Dysarthria: 2 ?11 Extinct. and Inatten.: 0 ?TOTAL: 22 ? ?Labs ?I have reviewed labs in epic and the results pertinent to this consultation are: ?CBC ?   ?Component Value Date/Time  ? WBC 4.6 09/26/2021 0542  ? RBC 4.32 09/26/2021 0542  ? HGB 12.7 (L) 09/26/2021 0542  ? HCT 37.1 (L) 09/26/2021 0542  ? PLT 144 (L) 09/26/2021 0542  ? MCV 85.9 09/26/2021 0542  ? MCH 29.4 09/26/2021 0542  ? MCHC 34.2 09/26/2021 0542  ? RDW 12.5 09/26/2021 0542  ? LYMPHSABS 1.5 09/26/2021 0542  ? MONOABS 0.4 09/26/2021 0542  ? EOSABS 0.2 09/26/2021 0542  ?  BASOSABS 0.0 09/26/2021 0542  ? ?CMP  ?   ?Component Value Date/Time  ? NA 140 09/26/2021 0542  ? NA 146 (H) 12/15/2018 1041  ? K 3.3 (L) 09/26/2021 0542  ? CL 109 09/26/2021 0542  ? CO2 23 09/26/2021 0542  ? GLUCOSE 104 (H) 09/26/2021 0542  ? BUN 15 09/26/2021 0542  ? BUN 23 12/15/2018 1041  ? CREATININE 0.89 09/26/2021 0542  ? CALCIUM 8.4 (L) 09/26/2021 0542  ? PROT 6.6 09/25/2021 1840  ? ALBUMIN 3.9 09/25/2021 1840  ? AST 28 09/25/2021 1840  ? ALT 25 09/25/2021 1840  ? ALKPHOS 72 09/25/2021 1840  ? BILITOT 0.9 09/25/2021 1840  ? GFRNONAA >60 09/26/2021 0542  ? GFRAA 100 12/15/2018 1041  ? ?Lipid Panel  ?   ?Component Value Date/Time  ? CHOL 155 09/26/2021 0542  ? TRIG 96 09/26/2021 0542  ? TRIG 92 09/26/2021 0542  ? HDL 36 (L) 09/26/2021 0542  ? CHOLHDL 4.3 09/26/2021 0542  ? VLDL 19 09/26/2021 0542  ? [place] 100 (H) 09/26/2021 0542  ?No results found for:  HGBA1C ? ?Imaging ?I have reviewed the images obtained: ? ?CT-scan of the brain 3/13: ?1. No acute intracranial abnormality. ?2. ASPECTS is 10. ? ?CTA - R M1 occlusion ? ?Assessment: 51 y.o. male with a history of presumed cardiac sar

## 2021-09-27 DIAGNOSIS — I255 Ischemic cardiomyopathy: Secondary | ICD-10-CM | POA: Diagnosis not present

## 2021-09-27 DIAGNOSIS — I428 Other cardiomyopathies: Secondary | ICD-10-CM

## 2021-09-27 DIAGNOSIS — I63411 Cerebral infarction due to embolism of right middle cerebral artery: Secondary | ICD-10-CM | POA: Diagnosis not present

## 2021-09-27 DIAGNOSIS — I4892 Unspecified atrial flutter: Secondary | ICD-10-CM | POA: Diagnosis not present

## 2021-09-27 DIAGNOSIS — I4729 Other ventricular tachycardia: Secondary | ICD-10-CM | POA: Diagnosis not present

## 2021-09-27 DIAGNOSIS — I5022 Chronic systolic (congestive) heart failure: Secondary | ICD-10-CM | POA: Diagnosis not present

## 2021-09-27 DIAGNOSIS — I6601 Occlusion and stenosis of right middle cerebral artery: Secondary | ICD-10-CM | POA: Diagnosis not present

## 2021-09-27 LAB — BASIC METABOLIC PANEL
Anion gap: 8 (ref 5–15)
BUN: 14 mg/dL (ref 6–20)
CO2: 24 mmol/L (ref 22–32)
Calcium: 8.5 mg/dL — ABNORMAL LOW (ref 8.9–10.3)
Chloride: 107 mmol/L (ref 98–111)
Creatinine, Ser: 1.03 mg/dL (ref 0.61–1.24)
GFR, Estimated: >60 mL/min (ref >60)
Glucose, Bld: 85 mg/dL (ref 70–99)
Potassium: 5 mmol/L (ref 3.5–5.1)
Sodium: 139 mmol/L (ref 135–145)

## 2021-09-27 LAB — CBC
HCT: 37.8 % — ABNORMAL LOW (ref 39.0–52.0)
Hemoglobin: 12.7 g/dL — ABNORMAL LOW (ref 13.0–17.0)
MCH: 30 pg (ref 26.0–34.0)
MCHC: 33.6 g/dL (ref 30.0–36.0)
MCV: 89.2 fL (ref 80.0–100.0)
Platelets: 179 10*3/uL (ref 150–400)
RBC: 4.24 MIL/uL (ref 4.22–5.81)
RDW: 13 % (ref 11.5–15.5)
WBC: 6.3 10*3/uL (ref 4.0–10.5)
nRBC: 0 % (ref 0.0–0.2)

## 2021-09-27 LAB — MAGNESIUM: Magnesium: 2.1 mg/dL (ref 1.7–2.4)

## 2021-09-27 NOTE — Evaluation (Addendum)
Speech Language Pathology Evaluation ?Patient Details ?Name: Ricardo Jones ?MRN: [id number] ?DOB: Jun 02, 1971 ?Today's Date: 09/27/2021 ?Time: 4034-7425 ?SLP Time Calculation (min) (ACUTE ONLY): 30 min ? ?Problem List:  ?Patient Active Problem List  ? Diagnosis Date Noted  ? 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 Acute respiratory insufficiency, postoperative   ? Cardiac resynchronization therapy defibrillator (CRT-D) in place 09/10/2019  ? 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 Complete heart block ([place]) 09/07/2018  ? Chronic systolic CHF (congestive heart failure) ([place]) 07/07/2018  ? 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 Tinea pedis of both feet 02/12/2018  ? Onychomycosis 02/12/2018  ? Chronic atrial flutter ([place]) 11/01/2016  ? ATRIAL FLUTTER 09/28/2010  ? BRADYCARDIA, CHRONIC 09/28/2010  ? ?Past Medical History:  ?Past Medical History:  ?Diagnosis Date  ? Bradycardia   ? asymptomatic  ? S/P dilatation of esophageal stricture   ? Typical atrial flutter ([place])   ? ?Past Surgical History:  ?Past Surgical History:  ?Procedure Laterality Date  ? BIV ICD INSERTION CRT-D N/A 09/08/2018  ? Procedure: BIV ICD INSERTION CRT-D;  Surgeon: Evans Lance, MD;  Location: [place] [facility];  Service: Cardiovascular;  Laterality: N/A;  ? IR CT HEAD LTD  09/25/2021  ? IR PERCUTANEOUS ART THROMBECTOMY/INFUSION INTRACRANIAL INC DIAG ANGIO  09/25/2021  ? RADIOLOGY WITH ANESTHESIA N/A 09/25/2021  ? Procedure: IR WITH ANESTHESIA;  Surgeon: Luanne Bras, MD;  Location: [place];  Service: Radiology;  Laterality: N/A;  ? THROAT SURGERY  2012  ? ?HPI:  ?Pt is 51 yo male who presents with R gaze deviation, aphasia, and L side weakness [facility]. 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 Few additional punctate acute infarcts. Received TNK and underwent revascularization R MCA.  PMH:  bradycardia, a flutter, CHF, cardiac sarcoidosis  ? ?Assessment / Plan / Recommendation ?Clinical Impression ? Pt is left handed with presumed area (S) of language on right affected side of brain. His language has improved and currently is speaking at the conversational level with intermittent anomia. His comprehension of basic and abstract information is good and on the bedside WAB he scored a 10/10 on auditory comprehension, 10/10 on sequential command following, 7/10 on repetition (needed repetition for sentence), 10/10 object naming, 3/3 for writing and 10/10 for apraxia. He needs cues to write at sentence level not single words. Pt is compensating by writing the word "money" when unable to generate the word wallet per wife. Therapist educated pt and wife on techniques for word finding, encouraged him to practice texting as well as writing and read articles of interest and discuss/anwer questions with wife. ST will work with pt while on acute care and recommend outpatient ST. Answered wife's questions. ?   ?SLP Assessment ? SLP Recommendation/Assessment: Patient needs continued [place] [facility] Services ?SLP Visit Diagnosis: Aphasia (R47.01)  ?  ?Recommendations for follow up therapy are one component of a multi-disciplinary discharge planning process, led by the attending physician.  Recommendations may be updated based on patient status, additional functional criteria and insurance authorization. ?   ?Follow Up Recommendations ? Outpatient SLP  ?  ?Assistance Recommended at Discharge ? Intermittent Supervision/Assistance  ?Functional Status Assessment Patient has had a recent decline in their functional status and demonstrates the ability to make significant improvements in function in a reasonable and predictable amount of time.  ?Frequency and Duration min  2x/week  ?2 weeks ?  ?   ?SLP Evaluation ?Cognition ? Overall Cognitive Status: Within Functional Limits for tasks assessed ?Arousal/Alertness:  Awake/alert ?Orientation Level: Oriented X4 ?Memory: Appears intact ?Awareness: Appears intact ?Problem Solving: Appears intact ?Safety/Judgment: Appears intact  ?  ?   ?Comprehension ? Auditory Comprehension ?Overall Auditory Comprehension: Appears within functional limits for tasks assessed ?Yes/No Questions: Within Functional Limits ?Commands: Within Functional Limits ?EffectiveTechniques: Extra processing time ?Visual Recognition/Discrimination ?Discrimination: Not tested ?Reading Comprehension ?Reading Status:  (TBA)  ?  ?Expression Expression ?Primary Mode of Expression: Verbal ?Verbal Expression ?Overall Verbal Expression: Impaired ?Automatic Speech:  [facility]) ?Level of Generative/Spontaneous Verbalization: Sentence ?Repetition: Impaired ?Level of Impairment: Sentence level (correct with exra time) ?Naming: No impairment (on bedside WAB) ?Pragmatics: No impairment ?Written Expression ?Dominant Hand: Left (Pt is left handed) ?Written Expression: Exceptions to [facility] ?Self Formulation Ability: Phrase   ?Oral / Motor ? Oral Motor/Sensory Function ?Overall Oral Motor/Sensory Function: Within functional limits ?Motor Speech ?Overall Motor Speech: Appears within functional limits for tasks assessed ?Respiration: Within functional limits ?Phonation: Normal ?Resonance: Within functional limits ?Articulation: Within functional limitis ?Intelligibility: Intelligible ?Motor Planning: Witnin functional limits   ?        ? ?Houston Siren ?09/27/2021, 10:42 AM ? ?

## 2021-09-27 NOTE — Progress Notes (Signed)
eLink Physician-Brief Progress Note ?Patient Name: Ricardo Jones ?DOB: 10/21/1970 ?MRN: [id number] ? ? ?Date of Service ? 09/27/2021  ?HPI/Events of Note ? Patient had a 7 beat run of wide-complex tachycardia, and is now A-V sequentially paced. Patient was asymptomatic.  ?eICU Interventions ? Will check electrolytes and monitor patient closely.  ? ? ? ?  ? ?Frederik Pear ?09/27/2021, 12:23 AM ?

## 2021-09-27 NOTE — Consult Note (Addendum)
?Cardiology Consultation:  ? ?Patient ID: Ricardo Jones ?MRN: [id number]; DOB: July 15, 1971 ? ?Admit date: 09/25/2021 ?Date of Consult: 09/27/2021 ? ?PCP:  Patient, No Pcp Per (Inactive) ?  ?[facility] Providers ?Cardiologist:  Mertie Moores, MD  ?Electrophysiologist:  Virl Axe, MD  { ? ?Patient Profile:  ? ?Ricardo Jones is a 51 y.o. 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 ? 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 Hx of  abnormal MRI and abnormal PET scan. High resolution CT scan unrevealing of sarcoid.  Declined further evaluation at [facility] by Dr. Burt Knack. His mother genetic testing for LMN is positive.  He has not yet been tested. ? ?Not on anticoagulation given CHADSVASCs score of 1 for LV dysfunction.  ? ?Per Dr. Olin Pia note 06/2021 "sinus node dysfunction with relative chronotropic incompetence contributes to his dyspnea" ? ?Echo 04/2018: LVEF 40-45% ?cMRI 05/2018: LVEF 44% ?Echo 02/2019: LVEF 50-55% ?Echo 07/2011:  LVEF 45-50% ? ?History of Present Illness:  ? ?Ricardo Jones presented with acute onset left sided weakness and aphasia. He was found to have R MCA stroke with M1 occlusion s/p TNKase & thrombectomy. Extubated yesterday. Had NSVT (7 beats) and PVCs overnight. Echo with reduced LVEF from prior and concern for loss of LV lead capture. Cardiology asked for further evaluation. Per device rap "LV lead is working normally. No afib burden".  ? ?K 3.3>>5.0 ?Scr 1.03 ?Hgb 12.7 ?09/26/2021: Cholesterol 155; HDL 36; LDL Cholesterol 100; Triglycerides 96; Triglycerides 92; VLDL 19  ?HgbA1c 6.0 ? ?Echo 09/26/21 ? 1. No left ventricular thrpmbus is seen (Definity was not administered).  ?There is marked septal-lateral dyssynchrony. Left ventricular ejection  ?fraction, by estimation, is 30 to 35%. The left  ventricle has moderately  ?decreased function. The left  ?ventricle demonstrates global hypokinesis. The left ventricular internal  ?cavity size was moderately dilated. Left ventricular diastolic function  ?could not be evaluated.  ? 2. Right ventricular systolic function is normal. The right ventricular  ?size is mildly enlarged. Device lead is visualized in the right ventricle  ?and in the coronary sinus. Tricuspid regurgitation signal is inadequate  ?for assessing PA pressure.  ? 3. Left atrial size was severely dilated.  ? 4. Right atrial size was severely dilated.  ? 5. The mitral valve is normal in structure. Trivial mitral valve  ?regurgitation.  ? 6. The aortic valve is tricuspid. Aortic valve regurgitation is not  ?visualized. No aortic stenosis is present.  ? 7. The inferior vena cava is dilated in size with <50% respiratory  ?variability, suggesting right atrial pressure of 15 mmHg.  ? ?Comparison(s): A prior study was performed on 08/03/2021. The left  ?ventricular function is significantly worse. Atrial fibrillation is new  ?since the January 2023 study. The degree of LV dyssynchrony is worse. The  ?paced QRS complex appears broader (155 ms  ? versus 123 ms in January), suggesting loss of LV lead capture. In  ?addition there are frequent PVCs or native conducted beats with broad QRS  ?complex, not seen on the previos study.  ? ?Conclusion(s)/Recommendation(s): Recommend CRT device interrogation and  ?assessment of LV capture.  ? ?Past Medical History:  ?Diagnosis Date  ? Bradycardia   ? asymptomatic  ? S/P dilatation of esophageal stricture   ? Typical atrial flutter ([place])   ? ? ?  Past Surgical History:  ?Procedure Laterality Date  ? BIV ICD INSERTION CRT-D N/A 09/08/2018  ? Procedure: BIV ICD INSERTION CRT-D;  Surgeon: Evans Lance, MD;  Location: [place] [facility];  Service: Cardiovascular;  Laterality: N/A;  ? IR CT HEAD LTD  09/25/2021  ? IR PERCUTANEOUS ART THROMBECTOMY/INFUSION INTRACRANIAL INC  DIAG ANGIO  09/25/2021  ? RADIOLOGY WITH ANESTHESIA N/A 09/25/2021  ? Procedure: IR WITH ANESTHESIA;  Surgeon: Luanne Bras, MD;  Location: [facility];  Service: Radiology;  Laterality: N/A;  ? THROAT SURGERY  2012  ?  ? ?Inpatient Medications: ?Scheduled Meds: ? aspirin EC  325 mg Oral QHS  ? atorvastatin  40 mg Oral Daily  ? bethanechol  10 mg Oral TID  ? Chlorhexidine Gluconate Cloth  6 each Topical Daily  ? ?Continuous Infusions: ? ?PRN Meds: ?acetaminophen **OR** acetaminophen (TYLENOL) oral liquid 160 mg/5 mL **OR** acetaminophen ? ?Allergies:   No Known Allergies ? ?Social History:   ?Social History  ? ?Socioeconomic History  ? Marital status: Single  ?  Spouse name: Not on file  ? Number of children: Not on file  ? Years of education: Not on file  ? Highest education level: Not on file  ?Occupational History  ? Not on file  ?Tobacco Use  ? Smoking status: Never  ? Smokeless tobacco: Never  ?Vaping Use  ? Vaping Use: Never used  ?Substance and Sexual Activity  ? Alcohol use: Yes  ?  Comment: occassionally  ? Drug use: No  ? Sexual activity: Not on file  ?Other Topics Concern  ? Not on file  ?Social History Narrative  ? 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 ?Social Determinants of Health  ? ?Financial Resource Strain: Not on file  ?Food Insecurity: Not on file  ?Transportation Needs: Not on file  ?Physical Activity: Not on file  ?Stress: Not on file  ?Social Connections: Not on file  ?Intimate Partner Violence: Not on file  ?  ?Family History:   ?Family History  ?Problem Relation Age of Onset  ? Bradycardia Other   ?     multiple family members with pacemakers  ? Asthma Mother   ? Cancer Maternal Grandmother   ? Cancer Maternal Grandfather   ? Cancer Paternal Grandmother   ?  ? ?ROS:  ?Please see the history of present illness.  ?All other ROS reviewed and negative.    ? ?Physical Exam/Data:  ? ?Vitals:  ? 09/27/21 0900 09/27/21 1000 09/27/21 1100 09/27/21 1200  ?BP:  119/87 125/87 119/87 (!) 138/93  ?Pulse: 74 73 73 71  ?Resp: (!) '21 15 16 18  '$ ?Temp:    97.8 ?F (36.6 ?C)  ?TempSrc:    Tympanic  ?SpO2: 95% 98% 96% 96%  ?Weight:      ?Height:      ? ? ?Intake/Output Summary (Last 24 hours) at 09/27/2021 1345 ?Last data filed at 09/27/2021 0800 ?Gross per 24 hour  ?Intake 781.67 ml  ?Output 1025 ml  ?Net -243.33 ml  ? ?Last 3 Weights 09/25/2021 09/25/2021 06/28/2021  ?Weight (lbs) 259 lb 14.8 oz 254 lb 13.6 oz 250 lb  ?Weight (kg) 117.9 kg 115.6 kg 113.399 kg  ?   ?Body mass index is 29.27 kg/m?.  ?General:  Well nourished, well developed, in no acute distress ?HEENT: normal ?Neck: no JVD ?Vascular: No carotid bruits; Distal pulses 2+ bilaterally ?Cardiac:  normal S1, S2; RRR; no murmur  ?Lungs:  clear  to auscultation bilaterally, no wheezing, rhonchi or rales  ?Abd: soft, nontender, no hepatomegaly  ?Ext: no edema ?Musculoskeletal:  No deformities,LUE weakness  ?Skin: warm and dry  ?Neuro:   no focal abnormalities noted, word finding difficulty  ?Psych:  Normal affect  ? ?EKG:  The EKG was personally reviewed and demonstrates:  AV pacing  ?Telemetry:  Telemetry was personally reviewed and demonstrates:  AV pacing  ? ?Relevant CV Studies: ? ?Echo 08/03/21 ?1. Left ventricular ejection fraction, by estimation, is 45 to 50%. The  ?left ventricle has mildly decreased function. The left ventricle  ?demonstrates global hypokinesis. The left ventricular internal cavity size  ?was mildly dilated. There is mild left  ?ventricular hypertrophy. Left ventricular diastolic parameters are  ?indeterminate. The average left ventricular global longitudinal strain is  ?-15.4 %.  ? 2. Right ventricular systolic function is normal. The right ventricular  ?size is normal. Tricuspid regurgitation signal is inadequate for assessing  ?PA pressure.  ? 3. Left atrial size was moderately dilated.  ? 4. Right atrial size was moderately dilated.  ? 5. The mitral valve is normal in structure. Mild mitral valve   ?regurgitation. No evidence of mitral stenosis.  ? 6. The aortic valve is normal in structure. Aortic valve regurgitation is  ?not visualized. No aortic stenosis is present.  ? 7. The inferior vena cava is d

## 2021-09-27 NOTE — Progress Notes (Signed)
? ?NAME:  Ricardo Jones, MRN:  [id number], DOB:  [birth year], LOS: 2 ?ADMISSION DATE:  09/25/2021, CONSULTATION DATE:  09/25/21 ?REFERRING MD:  Leonel Ramsay, CHIEF COMPLAINT:  Acute CVA  ? ?History of Present Illness:  ?Ricardo Jones is a 51 y.o. M with PMH of Atrial fibrillation not on anti-coagulation s/p ICD, ? Cardiac sarcoid who was brought into the ED after experiencing L-sided weakness, AMS and aphasia that began while on an elliptical machine at the gym.  LKW 1807 3/13.  He was given TNK and work-up revealed R MCA M1  occlusion.  He was taken for thrombectomy with TiCi3 revascularization after one pass and post procedure CT brain without ICH.  Post-procedure pt was left intubated and PCCM consulted for ventilator management ? ?Pertinent  Medical History  ? ?Past Medical History:  ?Diagnosis Date  ? Bradycardia   ? asymptomatic  ? S/P dilatation of esophageal stricture   ? Typical atrial flutter ([place])   ? ?Significant Hospital Events: ?Including procedures, antibiotic start and stop dates in addition to other pertinent events   ?3/13 Presented as Code stroke, received TNK and taken for revascularization, left intubated post-procedure ?3/14 extubated to room air. Participated in PT/OT  ? ?Interim History / Subjective:  ?Overnight patient had 7 beats of asymptomatic wide-complex tachycardia.  ?This morning, resting comfortably in bed with wife at bedside. Denies any concerns.  ? ?Objective   ?Blood pressure 125/89, pulse 71, temperature 97.9 ?F (36.6 ?C), temperature source Oral, resp. rate 17, height 6' 7.02" (2.007 m), weight 117.9 kg, SpO2 95 %. ?   ?Vent Mode: PSV;CPAP ?FiO2 (%):  [30 %] 30 % ?Set Rate:  [18 bmp] 18 bmp ?Vt Set:  [884 mL] 650 mL ?PEEP:  [5 cmH20] 5 cmH20 ?Pressure Support:  [8 cmH20] 8 cmH20 ?Plateau Pressure:  [17 cmH20] 17 cmH20  ? ?Intake/Output Summary (Last 24 hours) at 09/27/2021 0733 ?Last data filed at 09/27/2021 0600 ?Gross per 24 hour  ?Intake 955.64 ml  ?Output 1110 ml  ?Net -154.36 ml   ? ?Filed Weights  ? 09/25/21 1800 09/25/21 2230  ?Weight: 115.6 kg 117.9 kg  ? ? ?Examination: ?General: generally well appearing middle aged male; NAD ?HENT: [place]/AT, MMM, PERRL  ?Lungs: CTAB, no wheezing or rhonchi ?Cardiovascular: RRR, S1 and S2 present, no m/r/g ?Abdomen: nondistended, soft, +BS ?Extremities: warm and dry, no peripheral edema  ?Neuro: CN II-XII intact; Aox4 but does answer in one word sentences or "I don't know". Strength 5/5 in bilat upper and lower extremities with sensation to light touch intact.  ? ?Resolved Hospital Problem list   ?Acute hypoxic resp failure post thrombectomy requiring vent support  ?Thrombocytopenia ?Hypokalemia ?Assessment & Plan:  ?Acute ischemic stroke with right MCA LVO s/p thrombectomy  ?Presented with left sided weakness, aphasia s/p TNK and right MCA M1 branch thrombectomy. Undergoing stroke work up; lipid profile with elevated LDL and suboptimal HDL. MRI Brain/MRA head with acute infarcts of R basal ganglia and portion of posterior limb of internal capsule. No residual MCA occlusion post thrombectomy.  ?- Neurology following appreciate recommendations  ?- A1c pending ?- PT/OT/ SLP eval  ?- Will need to start anticoag once cleared by SLP  ? 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 Echo with EF 30-35% with  marked septal-lateral dyssynchrony; LV global hypokinesis - worse compared to Echo on 08/03/21.  Overnight, noted to have multiple PVCs. No electrolyte abnormalities ?- Will consult cardiology for device  interrogation  ?- Will need to start anticoagulation prior to discharge ? ?Normocytic anemia  ?Patient is s/p TKN. Stable anemia. No active bleeding at this time.  ?- Trend CBC ? ?Urinary retention  ?- Voiding trial today  ? ?Best Practice (right click and "Reselect all SmartList Selections" daily)  ? ?Diet/type: NPO ?DVT prophylaxis: SCD ?GI prophylaxis: PPI ?Lines:  N/A ?Foley:  Yes, and it is no longer needed and removal ordered  ?Code Status:  full code ?Last date of multidisciplinary goals of care discussion [3/15 family updated at bedside] ? ?Labs   ?CBC: ?Recent Labs  ?Lab 09/25/21 ?1840 09/25/21 ?1905 09/25/21 ?2223 09/26/21 ?5701 09/27/21 ?0052  ?WBC 6.8  --   --  4.6 6.3  ?NEUTROABS 4.4  --   --  2.6  --   ?HGB 14.4 14.6 12.6* 12.7* 12.7*  ?HCT 44.1 43.0 37.0* 37.1* 37.8*  ?MCV 90.4  --   --  85.9 89.2  ?PLT 159  --   --  144* 179  ? ? ?Basic Metabolic Panel: ?Recent Labs  ?Lab 09/25/21 ?1840 09/25/21 ?1905 09/25/21 ?2223 09/26/21 ?7793 09/27/21 ?0051  ?NA 140 141 140 140 139  ?K 4.3 4.4 3.8 3.3* 5.0  ?CL 104 105  --  109 107  ?CO2 25  --   --  23 24  ?GLUCOSE 102* 97  --  104* 85  ?BUN 20 24*  --  15 14  ?CREATININE 1.11 1.10  --  0.89 1.03  ?CALCIUM 9.2  --   --  8.4* 8.5*  ?MG  --   --   --   --  2.1  ? ?GFR: ?Estimated Creatinine Clearance: 122.7 mL/min (by C-G formula based on SCr of 1.03 mg/dL). ?Recent Labs  ?Lab 09/25/21 ?1840 09/26/21 ?9030 09/27/21 ?0052  ?WBC 6.8 4.6 6.3  ? ? ?Liver Function Tests: ?Recent Labs  ?Lab 09/25/21 ?1840  ?AST 28  ?ALT 25  ?ALKPHOS 72  ?BILITOT 0.9  ?PROT 6.6  ?ALBUMIN 3.9  ? ?No results for input(s): LIPASE, AMYLASE in the last 168 hours. ?No results for input(s): AMMONIA in the last 168 hours. ? ?ABG ?   ?Component Value Date/Time  ? PHART 7.457 (H) 09/25/2021 2223  ? PCO2ART 37.0 09/25/2021 2223  ? PO2ART 509 (H) 09/25/2021 2223  ? HCO3 26.2 09/25/2021 2223  ? TCO2 27 09/25/2021 2223  ? O2SAT 100 09/25/2021 2223  ?  ? ?Coagulation Profile: ?Recent Labs  ?Lab 09/25/21 ?1840  ?INR 1.1  ? ? ?Cardiac Enzymes: ?No results for input(s): CKTOTAL, CKMB, CKMBINDEX, TROPONINI in the last 168 hours. ? ?HbA1C: ?Hgb A1c MFr Bld  ?Date/Time Value Ref Range Status  ?09/26/2021 05:42 AM 6.0 (H) 4.8 - 5.6 % Final  ?  Comment:  ?  (NOTE) ?        Prediabetes: 5.7 - 6.4 ?        Diabetes: >6.4 ?        Glycemic control for adults with diabetes:  <7.0 ?  ? ? ?CBG: ?Recent Labs  ?Lab 09/25/21 ?2331 09/26/21 ?0923 09/26/21 ?0737 09/26/21 ?1155 09/26/21 ?1547  ?GLUCAP 88 110* 112* 114* 83  ? ? ?Critical care time: 35 minutes ?  ? ? ? ? ? ?

## 2021-09-27 NOTE — Progress Notes (Addendum)
STROKE TEAM PROGRESS NOTE  ? ?INTERVAL HISTORY ?Wife at the bedside. Pt reclining in bed, mildly lethargic, still has mild expressive with hesitancy of speech and word finding difficulty. Moving all extremities. MRI showed right BG, caudate head and small scattered infarct at R MCA territory. EF 30-35% decreased than before, will have Dr Caryl Comes cardiology to see him. Continue ASA 325 for today, will start eliquis tomorrow.    ? ?Vitals:  ? 09/27/21 0900 09/27/21 1000 09/27/21 1100 09/27/21 1200  ?BP: 119/87 125/87 119/87 (!) 138/93  ?Pulse: 74 73 73 71  ?Resp: (!) '21 15 16 18  '$ ?Temp:    97.8 ?F (36.6 ?C)  ?TempSrc:    Tympanic  ?SpO2: 95% 98% 96% 96%  ?Weight:      ?Height:      ? ?CBC:  ?Recent Labs  ?Lab 09/25/21 ?1840 09/25/21 ?1905 09/26/21 ?7106 09/27/21 ?0052  ?WBC 6.8  --  4.6 6.3  ?NEUTROABS 4.4  --  2.6  --   ?HGB 14.4   < > 12.7* 12.7*  ?HCT 44.1   < > 37.1* 37.8*  ?MCV 90.4  --  85.9 89.2  ?PLT 159  --  144* 179  ? < > = values in this interval not displayed.  ? ?Basic Metabolic Panel:  ?Recent Labs  ?Lab 09/26/21 ?2694 09/27/21 ?0051  ?NA 140 139  ?K 3.3* 5.0  ?CL 109 107  ?CO2 23 24  ?GLUCOSE 104* 85  ?BUN 15 14  ?CREATININE 0.89 1.03  ?CALCIUM 8.4* 8.5*  ?MG  --  2.1  ? ?Lipid Panel:  ?Recent Labs  ?Lab 09/26/21 ?0542  ?CHOL 155  ?TRIG 96  92  ?HDL 36*  ?CHOLHDL 4.3  ?VLDL 19  ?LDLCALC 100*  ? ?HgbA1c:  ?Recent Labs  ?Lab 09/26/21 ?0542  ?HGBA1C 6.0*  ? ?Urine Drug Screen:  ?Recent Labs  ?Lab 09/26/21 ?0423  ?LABOPIA NONE DETECTED  ?COCAINSCRNUR NONE DETECTED  ?LABBENZ NONE DETECTED  ?AMPHETMU NONE DETECTED  ?THCU NONE DETECTED  ?LABBARB NONE DETECTED  ?  ?Alcohol Level  ?Recent Labs  ?Lab 09/25/21 ?2150  ?ETH <10  ? ? ?IMAGING past 24 hours ?MR ANGIO HEAD WO CONTRAST ? ?Result Date: 09/26/2021 ?CLINICAL DATA:  Stroke, follow up; right MCA occlusion post thrombectomy EXAM: MRI HEAD WITHOUT CONTRAST MRA HEAD WITHOUT CONTRAST TECHNIQUE: Multiplanar, multi-echo pulse sequences of the brain and surrounding  structures were acquired without intravenous contrast. Angiographic images of the Circle of Willis were acquired using MRA technique without intravenous contrast. COMPARISON:  No pertinent prior exam. FINDINGS: MRI HEAD Brain: There is restricted diffusion involving the right caudate and lentiform nucleus and a portion of the posterior limb of the right internal capsule. There are a few punctate foci of restricted diffusion in the right frontal and temporal lobes and left frontal lobe. There is no evidence of hemorrhage. No significant mass effect. No hydrocephalus or extra-axial collection. Vascular: Major vessel flow voids at the skull base are preserved. Skull and upper cervical spine: Normal marrow signal is preserved. Sinuses/Orbits: Right maxillary sinus retention cyst. Orbits are unremarkable. Other: Sella is unremarkable.  Mastoid air cells are clear. MRA HEAD Intracranial internal carotid arteries are patent. There is no longer a right MCA occlusion. Left middle and both anterior cerebral arteries are patent. Intracranial vertebral arteries, basilar artery, posterior cerebral arteries are patent. Bilateral posterior communicating arteries are present. 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 Few  additional punctate acute infarcts. No hemorrhage. No residual MCA occlusion post thrombectomy. Electronically Signed   By: Macy Mis M.D.   On: 09/26/2021 15:34  ? ?MR BRAIN WO CONTRAST ? ?Result Date: 09/26/2021 ?CLINICAL DATA:  Stroke, follow up; right MCA occlusion post thrombectomy EXAM: MRI HEAD WITHOUT CONTRAST MRA HEAD WITHOUT CONTRAST TECHNIQUE: Multiplanar, multi-echo pulse sequences of the brain and surrounding structures were acquired without intravenous contrast. Angiographic images of the Circle of Willis were acquired using MRA technique without intravenous contrast. COMPARISON:  No pertinent prior exam. FINDINGS: MRI HEAD  Brain: There is restricted diffusion involving the right caudate and lentiform nucleus and a portion of the posterior limb of the right internal capsule. There are a few punctate foci of restricted diffusion in the right frontal and temporal lobes and left frontal lobe. There is no evidence of hemorrhage. No significant mass effect. No hydrocephalus or extra-axial collection. Vascular: Major vessel flow voids at the skull base are preserved. Skull and upper cervical spine: Normal marrow signal is preserved. Sinuses/Orbits: Right maxillary sinus retention cyst. Orbits are unremarkable. Other: Sella is unremarkable.  Mastoid air cells are clear. MRA HEAD Intracranial internal carotid arteries are patent. There is no longer a right MCA occlusion. Left middle and both anterior cerebral arteries are patent. Intracranial vertebral arteries, basilar artery, posterior cerebral arteries are patent. Bilateral posterior communicating arteries are present. 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 Few additional punctate acute infarcts. No hemorrhage. No residual MCA occlusion post thrombectomy. Electronically Signed   By: Macy Mis M.D.   On: 09/26/2021 15:34   ? ?PHYSICAL EXAM ?General:  Alert, well-nourished, well-developed patient in no acute distress ?Respiratory:  Regular, unlabored breathing on supplemental O2 via [place] ? ?NEURO:  ?awake alert, answering with "I do not know" for most orientation questions, expressive aphasia with spontaneous speaking, but able to name 3/5 and able to repeat sentences. No gaze palsy, blinking to visual threat bilaterally, left facial droop.  Bilateral upper and lower extremity against gravity, symmetric.  Finger-to-nose bilaterally intact.  Sensation subjective symmetrical. ? ?ASSESSMENT/PLAN ?Ricardo Jones is a left-handed 51 y.o. male with history of atrial flutter not on anticoagulation and possible cardiac  sarcoidosis presenting after he became unresponsive while at the gym.  EMS was called, and patient presented with a right gaze deviation, left-sided weakness and inability to speak or follow commands.  Right sided M1 occlusion was found, TNK was given, and patient was taken for thrombectomy.  TICI 3 flow was achieved in right M1 with one pass.   ? ?Stroke:  right MCA infarct due to right M1 occlusion s/p IR with TICI3 likely secondary to atrial flutter and cardiomyopathy not on AC ?Code Stroke CT head No acute abnormality.ASPECTS 10.    ?CTA head & neck right M1 occlusion with minimal reconstitution in M2, no other LVO or significant stenosis, no hemodynamically significant stenosis in the neck. ?MRI Acute infarcts of the right basal ganglia and a portion of the posterior limb of the internal capsule. Few additional punctate acute infarcts.  ?MRA No residual MCA occlusion post thrombectomy. ?2D Echo EF 30-35%, global hypokinesis in left ventricle, dilated left and right atria, no atrial level shunt ?LDL 100 ?HgbA1c 6.0 ?VTE prophylaxis - SCDs ?No antithrombotic prior to admission, now on ASA 325. Will transition to eliquis tomorrow given recent stroke ?Therapy recommendations:  outpt PT ?Disposition:  pending ? ?Cardiomyopathy  ?Family history  of heart disease ??possible cardiac sarcoidosis, diagnosis never confirmed ?07/2021 EF 45-50% ?This time 2D echo EF 30-35% ?S/p AICD insertion ?Dr. Caryl Comes following as outpt ?Cardiology consulted ? ?A flutter not on AC ?Not on AC due to low CHA2DS2-VASc score ?Current telemetry monitoring showed bigeminy PVCs ?Dr. Caryl Comes following as outpt ?On ASA 325 ?May consider anticoagulation tomorrow ? ?Hypertension ?Home meds:  none ?Stable ?Long-term BP goal normotensive ? ?Hyperlipidemia ?Home meds:  none ?LDL 100, goal < 70 ?Add atorvastatin 40 mg daily ?Continue statin at discharge ? ?Other Stroke Risk Factors ? ? ?Other Active Problems ?Urinary retention status post Foley  catheter ?Left handed ? ? ?Hospital day # 2 ? ?This patient is critically ill due to right MCA infarct, s/p TNK and IR, cardiomyopathy, aflutter not on AC and at significant risk of neurological worsening, death f

## 2021-09-27 NOTE — Evaluation (Signed)
Occupational Therapy Evaluation ?Patient Details ?Name: Ricardo Jones ?MRN: [id number] ?DOB: 02-14-1971 ?Today's Date: 09/27/2021 ? ? ?History of Present Illness Pt is 51 yo male who presents with R gaze deviation, aphasia, and L [facility] Received T[facility] and underwent revascularization R MCA.  PMH: bradycardia, a flutter, CHF, cardiac sarcoidosis  ? ?Clinical Impression ?  ?Jassiel was evaluated s/p the above admission list, he is completely indep at baseline including working and driving (wife reports pt drives 2-3 hours to job sites). Upon evaluation pt demonstrated indep functional mobility and ADLs. Clock draw assessment and cognition assessed with impairments noted in delayed recall and working memory. He accurately completed the clock, and answered all orientation questions with only one cue. Pt also presents with expressive aphasia, therefore higher level cognitive tasks need to be further assessed to determine cognitive impairments vs. solely communication deficits. Pt will benefit from neuro OP OT to progress IADLs, job related functions and driving.  ?   ? ?Recommendations for follow up therapy are one component of a multi-disciplinary discharge planning process, led by the attending physician.  Recommendations may be updated based on patient status, additional functional criteria and insurance authorization.  ? ?Follow Up Recommendations ? Outpatient OT (neuro OPOT)  ?  ?Assistance Recommended at Discharge Frequent or constant Supervision/Assistance  ?Patient can return home with the following Assist for transportation ? ?  ?Functional Status Assessment ? Patient has had a recent decline in their functional status and demonstrates the ability to make significant improvements in function in a reasonable and predictable amount of time.  ?Equipment Recommendations ? None recommended by OT  ?  ?   ?Precautions / Restrictions Precautions ?Precautions:  None ?Restrictions ?Weight Bearing Restrictions: No  ? ?  ? ?Mobility Bed Mobility ?Overal bed mobility: Independent ?  ?  ?  ?  ?  ?  ?  ?  ? ?Transfers ?Overall transfer level: Independent ?  ?  ?  ?  ?  ?  ?  ?  ?  ?  ? ?  ?Balance Overall balance assessment: Independent ?  ?  ?  ?  ?  ?  ?  ?  ?   ? ?ADL either performed or assessed with clinical judgement  ? ?ADL Overall ADL's : Independent ?  ?  ?  ?  ?  ?General ADL Comments: pt demonstrated indep ability to complete ADLs; need to further assess higher level IADLs and executive functioning tasks  ? ? ? ?Vision Baseline Vision/History: 0 No visual deficits ?Vision Assessment?: No apparent visual deficits  ?   ?   ?   ? ?Pertinent Vitals/Pain Pain Assessment ?Pain Assessment: No/denies pain  ? ? ? ?Hand Dominance Left ?  ?Extremity/Trunk Assessment Upper Extremity Assessment ?Upper Extremity Assessment: Defer to OT evaluation ?  ?Lower Extremity Assessment ?Lower Extremity Assessment: Overall WFL for tasks assessed ?  ?Cervical / Trunk Assessment ?Cervical / Trunk Assessment: Normal ?  ?Communication Communication ?Communication: Receptive difficulties;Expressive difficulties ?  ?Cognition Arousal/Alertness: Awake/alert ?Behavior During Therapy: Flat affect ?Overall Cognitive Status: Impaired/Different from baseline ?Area of Impairment: Memory ?  ?  ?  ?  ?General Comments: requires incr time for word finding, communicated wants/needs effectively. Answered all orientation questsions appropiately but required cue for "[place]." Did well with immediate recall, only 1/3 with 5 minute delay. able to complete clock draw accurately with accurate time reading. ?  ?  ?General Comments  VSS on RA, questions  about driving and returning to work but defered to MD ? ?  ?   ?   ? ? ?Home Living Family/patient expects to be discharged to:: Private residence ?Living Arrangements: Spouse/significant other;Children ?Available Help at Discharge: Family;Available  PRN/intermittently ?Type of Home: House ?Home Access: Stairs to enter ?Entrance Stairs-Number of Steps: 14 ?  ?Home Layout: Multi-level ?Alternate Level Stairs-Number of Steps: flight ?  ?Bathroom Shower/Tub: Walk-in shower ?  ?  ?  ?  ?Home Equipment: None ?  ?Additional Comments: lives with wife and 4 kids, 15 to teenage ? Lives With: Spouse ? ?  ?Prior Functioning/Environment Prior Level of Function : Independent/Modified Independent;Working/employed;Driving ?  ?  ?  ?  ?  ?  ?Mobility Comments: works as an Control and instrumentation engineer, works out at the [employer] 3 days/wk ?ADLs Comments: indep ?  ? ?  ?  ?OT Problem List: Decreased cognition ?  ?   ?OT Treatment/Interventions: Cognitive remediation/compensation  ?  ?OT Goals(Current goals can be found in the care plan section) Acute Rehab OT Goals ?Patient Stated Goal: home asap ?OT Goal Formulation: With patient ?Time For Goal Achievement: 10/11/21 ?Potential to Achieve Goals: Good ?ADL Goals ?Additional ADL Goal #1: Pt will independently complete an IADL medication management task ?Additional ADL Goal #2: Pt will demonstrate improved working memory by indpendently carrying out a 5 step multi-directional task  ?OT Frequency: Min 2X/week ?  ? ?   ?AM-PAC OT "6 Clicks" Daily Activity     ?Outcome Measure Help from another person eating meals?: None ?Help from another person taking care of personal grooming?: None ?Help from another person toileting, which includes using toliet, bedpan, or urinal?: None ?Help from another person bathing (including washing, rinsing, drying)?: None ?Help from another person to put on and taking off regular upper body clothing?: None ?Help from another person to put on and taking off regular lower body clothing?: None ?6 Click Score: 24 ?  ?End of Session Nurse Communication: Mobility status ? ?Activity Tolerance: Patient tolerated treatment well ?Patient left: in chair;with call bell/phone within reach;with family/visitor present ? ?OT Visit Diagnosis:  Cognitive communication deficit (R41.841) ?Symptoms and signs involving cognitive functions: Cerebral infarction  ?              ?Time: 3159-4585 ?OT Time Calculation (min): 17 min ?Charges:  OT General Charges ?$OT Visit: 1 Visit ?OT Evaluation ?$OT Eval Moderate Complexity: 1 Mod ? ? ?Alexus Galka A Maigan Bittinger ?09/27/2021, 5:34 PM ?

## 2021-09-27 NOTE — Progress Notes (Signed)
Physical Therapy Treatment ?Patient Details ?Name: Ricardo Jones ?MRN: [id number] ?DOB: 06-Jul-1971 ?Today's Date: 09/27/2021 ? ? ?History of Present Illness Pt is 51 yo male who presents with R gaze deviation, aphasia, and L [facility] Received TNK and underwent revascularization R MCA.  PMH: bradycardia, a flutter, CHF, cardiac sarcoidosis ? ?  ?PT Comments  ? ? PT tolerates treatment well, ambulating for limited community distances and accepting dynamic balance and gait challenges without loss of balance. Pt is able to perform all mobility required within the home without assistance at this time. Pt demonstrates no current acute PT needs. Acute PT signing off. Pt is encouraged to ambulate multiple times daily during this admission.   ?Recommendations for follow up therapy are one component of a multi-disciplinary discharge planning process, led by the attending physician.  Recommendations may be updated based on patient status, additional functional criteria and insurance authorization. ? ?Follow Up Recommendations ? No PT follow up ?  ?  ?Assistance Recommended at Discharge PRN  ?Patient can return home with the following Direct supervision/assist for medications management;Direct supervision/assist for financial management;Assist for transportation ?  ?Equipment Recommendations ? None recommended by PT  ?  ?Recommendations for Other Services   ? ? ?  ?Precautions / Restrictions Precautions ?Precautions: None ?Restrictions ?Weight Bearing Restrictions: No  ?  ? ?Mobility ? Bed Mobility ?  ?  ?  ?  ?  ?  ?  ?  ?  ? ?Transfers ?Overall transfer level: Independent ?  ?  ?  ?  ?  ?  ?  ?  ?  ?  ? ?Ambulation/Gait ?Ambulation/Gait assistance: Independent ?Gait Distance (Feet): 300 Feet ?Assistive device: None ?Gait Pattern/deviations: WFL(Within Functional Limits) ?Gait velocity: functional ?Gait velocity interpretation: >2.62 ft/sec, indicative of community ambulatory ?   ?General Gait Details: steady step-through gait, tolerates multiple dynamic gait challenges including head turns, changes of gait speed and stride length, abrupt stops, backward walking ? ? ?Stairs ?Stairs: Yes ?Stairs assistance: Modified independent (Device/Increase time) ?Stair Management: One rail Right, Step to pattern ?Number of Stairs: 20 ?  ? ? ?Wheelchair Mobility ?  ? ?Modified Rankin (Stroke Patients Only) ?Modified Rankin (Stroke Patients Only) ?Pre-Morbid Rankin Score: No symptoms ?Modified Rankin: No significant disability ? ? ?  ?Balance Overall balance assessment: No apparent balance deficits (not formally assessed) ?  ?  ?  ?  ?  ?  ?Standing balance comment: pt hold SLS on RLE for 10 seconds, rhomberg eyes closed for 30 seconds without balance deviation ?  ?  ?  ?  ?  ?  ?  ?  ?  ?  ?  ?  ? ?  ?Cognition Arousal/Alertness: Awake/alert ?Behavior During Therapy: Flat affect ?Overall Cognitive Status: Within Functional Limits for tasks assessed (difficult to fully assess due to expressive aphasia, however appears [facility]) ?  ?  ?  ?  ?  ?  ?  ?  ?  ?  ?  ?  ?  ?  ?  ?  ?  ?  ?  ? ?  ?Exercises   ? ?  ?General Comments General comments (skin integrity, edema, etc.): VSS on RA. Pt inquiring about return to driving PT defers to MD ?  ?  ? ?Pertinent Vitals/Pain Pain Assessment ?Pain Assessment: No/denies pain  ? ? ?Home Living   ?  ?  ?  ?  ?  ?  ?  ?  ?  ?   ?  ?  Prior Function    ?  ?  ?   ? ?PT Goals (current goals can now be found in the care plan section) Acute Rehab PT Goals ?Patient Stated Goal: pt did not state ?Progress towards PT goals: Goals met/education completed, patient discharged from PT ? ?  ?Frequency ? ? ? Min 4X/week ? ? ? ?  ?PT Plan Current plan remains appropriate  ? ? ?Co-evaluation   ?  ?  ?  ?  ? ?  ?AM-PAC PT "6 Clicks" Mobility   ?Outcome Measure ? Help needed turning from your back to your side while in a flat bed without using bedrails?: None ?Help needed moving from lying on  your back to sitting on the side of a flat bed without using bedrails?: None ?Help needed moving to and from a bed to a chair (including a wheelchair)?: None ?Help needed standing up from a chair using your arms (e.g., wheelchair or bedside chair)?: None ?Help needed to walk in hospital room?: None ?Help needed climbing 3-5 steps with a railing? : None ?6 Click Score: 24 ? ?  ?End of Session   ?Activity Tolerance: Patient tolerated treatment well ?Patient left: in chair;with call bell/phone within reach;with family/visitor present ?Nurse Communication: Mobility status ?PT Visit Diagnosis: Unsteadiness on feet (R26.81) ?  ? ? ?Time: 9068-9340 ?PT Time Calculation (min) (ACUTE ONLY): 17 min ? ?Charges:  $Gait Training: 8-22 mins          ?          ? ?Zenaida Niece, PT, DPT ?Acute Rehabilitation ?Pager: [phone] ?Office [phone] ? ? ? ?Zenaida Niece ?09/27/2021, 3:34 PM ? ?

## 2021-09-28 ENCOUNTER — Other Ambulatory Visit (HOSPITAL_COMMUNITY): Payer: Self-pay

## 2021-09-28 DIAGNOSIS — I255 Ischemic cardiomyopathy: Secondary | ICD-10-CM | POA: Diagnosis not present

## 2021-09-28 DIAGNOSIS — I6601 Occlusion and stenosis of right middle cerebral artery: Secondary | ICD-10-CM | POA: Diagnosis not present

## 2021-09-28 DIAGNOSIS — I63411 Cerebral infarction due to embolism of right middle cerebral artery: Secondary | ICD-10-CM | POA: Diagnosis not present

## 2021-09-28 DIAGNOSIS — E7800 Pure hypercholesterolemia, unspecified: Secondary | ICD-10-CM

## 2021-09-28 LAB — BASIC METABOLIC PANEL
Anion gap: 7 (ref 5–15)
BUN: 15 mg/dL (ref 6–20)
CO2: 27 mmol/L (ref 22–32)
Calcium: 8.7 mg/dL — ABNORMAL LOW (ref 8.9–10.3)
Chloride: 104 mmol/L (ref 98–111)
Creatinine, Ser: 0.98 mg/dL (ref 0.61–1.24)
GFR, Estimated: >60 mL/min (ref >60)
Glucose, Bld: 94 mg/dL (ref 70–99)
Potassium: 4.2 mmol/L (ref 3.5–5.1)
Sodium: 138 mmol/L (ref 135–145)

## 2021-09-28 LAB — MAGNESIUM: Magnesium: 1.9 mg/dL (ref 1.7–2.4)

## 2021-09-28 LAB — CBC
HCT: 37.5 % — ABNORMAL LOW (ref 39.0–52.0)
Hemoglobin: 12.6 g/dL — ABNORMAL LOW (ref 13.0–17.0)
MCH: 29.8 pg (ref 26.0–34.0)
MCHC: 33.6 g/dL (ref 30.0–36.0)
MCV: 88.7 fL (ref 80.0–100.0)
Platelets: 142 10*3/uL — ABNORMAL LOW (ref 150–400)
RBC: 4.23 MIL/uL (ref 4.22–5.81)
RDW: 12.7 % (ref 11.5–15.5)
WBC: 5.6 10*3/uL (ref 4.0–10.5)
nRBC: 0 % (ref 0.0–0.2)

## 2021-09-28 MED ORDER — CARVEDILOL 3.125 MG PO TABS: 3.1250 mg | ORAL_TABLET | Freq: Two times a day (BID) | ORAL | Status: DC

## 2021-09-28 MED ORDER — APIXABAN 5 MG PO TABS: 5.0000 mg | ORAL_TABLET | Freq: Two times a day (BID) | ORAL | Status: DC

## 2021-09-28 MED ORDER — MAGNESIUM OXIDE -MG SUPPLEMENT 400 (240 MG) MG PO TABS
400.0000 mg | ORAL_TABLET | Freq: Once | ORAL | Status: AC
  Administered 2021-09-28: 400 mg via ORAL
  Filled 2021-09-28: qty 1

## 2021-09-28 MED ORDER — ATORVASTATIN CALCIUM 40 MG PO TABS
40.0000 mg | ORAL_TABLET | Freq: Every day | ORAL | 1 refills | Status: DC
  Filled 2021-09-28: qty 30, 30d supply, fill #0

## 2021-09-28 MED ORDER — MAGNESIUM SULFATE 2 GM/50ML IV SOLN: 2.0000 g | Freq: Once | INTRAVENOUS | Status: DC

## 2021-09-28 MED ORDER — CARVEDILOL 3.125 MG PO TABS
3.1250 mg | ORAL_TABLET | Freq: Two times a day (BID) | ORAL | 5 refills | Status: DC
  Filled 2021-09-28: qty 60, 30d supply, fill #0

## 2021-09-28 MED ORDER — APIXABAN 5 MG PO TABS
5.0000 mg | ORAL_TABLET | Freq: Two times a day (BID) | ORAL | 1 refills | Status: DC
  Filled 2021-09-28: qty 60, 30d supply, fill #0

## 2021-09-28 NOTE — Progress Notes (Signed)
Telemetry called and stated pt had 6 beats of WQRS Vtach, pt was resting at time. AxOx4. Denies pain. VS taken at this time as well. Elink called to inform.  ?

## 2021-09-28 NOTE — Discharge Instructions (Addendum)
Ricardo Jones, you were admitted with aphasia and right sided weakness caused by a stroke.  You were treated with mechanical thrombectomy, which was successful.  As your stroke was likely caused by a blood clot from your heart, you will be discharged on Eliquis to prevent further blood clots.  It is important to take this medication as prescribed.  You will continue your OT sessions as an outpatient. ? ?Information on my medicine - ELIQUIS? (apixaban) ? ?Why was Eliquis? prescribed for you? ?Eliquis? was prescribed for you to reduce the risk of forming blood clots that can cause a stroke if you have a medical condition called atrial fibrillation (a type of irregular heartbeat) OR to reduce the risk of a blood clots forming after orthopedic surgery. ? ?What do You need to know about Eliquis? ? ?Take your Eliquis? TWICE DAILY - one tablet in the morning and one tablet in the evening with or without food.  It would be best to take the doses about the same time each day. ? ?If you have difficulty swallowing the tablet whole please discuss with your pharmacist how to take the medication safely. ? ?Take Eliquis? exactly as prescribed by your doctor and DO NOT stop taking Eliquis? without talking to the doctor who prescribed the medication.  Stopping may increase your risk of developing a new clot or stroke.  Refill your prescription before you run out. ? ?After discharge, you should have regular check-up appointments with your healthcare provider that is prescribing your Eliquis?.  In the future your dose may need to be changed if your kidney function or weight changes by a significant amount or as you get older. ? ?What do you do if you miss a dose? ?If you miss a dose, take it as soon as you remember on the same day and resume taking twice daily.  Do not take more than one dose of ELIQUIS at the same time. ? ?Important Safety Information ?A possible side effect of Eliquis? is bleeding. You should call your healthcare  provider right away if you experience any of the following: ?Bleeding from an injury or your nose that does not stop. ?Unusual colored urine (red or dark brown) or unusual colored stools (red or black). ?Unusual bruising for unknown reasons. ?A serious fall or if you hit your head (even if there is no bleeding). ? ?Some medicines may interact with Eliquis? and might increase your risk of bleeding or clotting while on Eliquis?Marland Kitchen To help avoid this, consult your healthcare provider or pharmacist prior to using any new prescription or non-prescription medications, including herbals, vitamins, non-steroidal anti-inflammatory drugs (NSAIDs) and supplements. ? ?This website has more information on Eliquis? (apixaban): [email or web address]. ?  ?

## 2021-09-28 NOTE — Progress Notes (Signed)
Speech Language Pathology Treatment: Cognitive-Linquistic  ?Patient Details ?Name: Ricardo Jones ?MRN: [id number] ?DOB: [birth year] ?Today's Date: 09/28/2021 ?Time: 5681-2751 ?SLP Time Calculation (min) (ACUTE ONLY): 14 min ? ?Assessment / Plan / Recommendation ?Clinical Impression ? Pt seen for aphasia therapy for higher level abilities. Provided pt and wife written activities pt and use for home program to facilitate language in more complex situations. Engaged pt in conversational speech with noted anomia 4-5 times and several times with decreased semantics. He benefited from wife providing initial letter. Educated her on the hierarchy of cues and to provide hardest cue first with semantic cues moving to letter or phoneme it needed. Pt is being discharged today and will be seen at [facility] rehab for speech therapy.  ?  ?HPI HPI: Pt is 51 yo male who presents with R gaze deviation, aphasia, and L 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 Few additional punctate acute infarcts. Received TNK and underwent revascularization R MCA.  PMH: bradycardia, a flutter, CHF, cardiac sarcoidosis ?  ?   ?SLP Plan ?  (pt discharging today) ? ?  ?  ?Recommendations for follow up therapy are one component of a multi-disciplinary discharge planning process, led by the attending physician.  Recommendations may be updated based on patient status, additional functional criteria and insurance authorization. ?  ? ?Recommendations  ?   ?   ?    ?   ? ? ? ? Follow Up Recommendations: Outpatient SLP ?Assistance recommended at discharge: Intermittent Supervision/Assistance ?SLP Visit Diagnosis: Aphasia (R47.01) ?Plan:  (pt discharging today) ? ? ? ? ?  ?  ? ? ?Houston Siren ? ?09/28/2021, 3:17 PM ?

## 2021-09-28 NOTE — Progress Notes (Addendum)
? ?Progress Note ? ?Patient Name: Ricardo Jones ?Date of Encounter: 09/28/2021 ? ?[facility] Cardiologist: Mertie Moores, MD  ? ?Subjective  ? ?No complaints. Hopeful to go home soon.  ? ?Inpatient Medications  ?  ?Scheduled Meds: ? apixaban  5 mg Oral BID  ? atorvastatin  40 mg Oral Daily  ? bethanechol  10 mg Oral TID  ? Chlorhexidine Gluconate Cloth  6 each Topical Daily  ? ?Continuous Infusions: ? magnesium sulfate bolus IVPB    ? ?PRN Meds: ?acetaminophen **OR** acetaminophen (TYLENOL) oral liquid 160 mg/5 mL **OR** acetaminophen  ? ?Vital Signs  ?  ?Vitals:  ? 09/27/21 2000 09/27/21 2337 09/28/21 0768 09/28/21 0881  ?BP:  111/88 122/76 119/84  ?Pulse:  70 70 71  ?Resp: '19 15 17 18  '$ ?Temp: 99 ?F (37.2 ?C) (!) 97.5 ?F (36.4 ?C) 97.9 ?F (36.6 ?C) 98 ?F (36.7 ?C)  ?TempSrc: Oral Oral Oral Oral  ?SpO2: 94% 100% 99% 99%  ?Weight:      ?Height:      ? ? ?Intake/Output Summary (Last 24 hours) at 09/28/2021 1000 ?Last data filed at 09/28/2021 0630 ?Gross per 24 hour  ?Intake 660 ml  ?Output 850 ml  ?Net -190 ml  ? ?Last 3 Weights 09/25/2021 09/25/2021 06/28/2021  ?Weight (lbs) 259 lb 14.8 oz 254 lb 13.6 oz 250 lb  ?Weight (kg) 117.9 kg 115.6 kg 113.399 kg  ?   ? ?Telemetry  ?  ?AV paced, 6 beats of NSVT - Personally Reviewed ? ?ECG  ?  ?No new tracing ? ?Physical Exam  ? ?GEN: Very tall, young WM. Standing in the room.  ?Neck: No JVD ?Cardiac: RRR, no murmurs, rubs, or gallops.  ?Respiratory: Clear to auscultation bilaterally. ?GI: Soft, nontender, non-distended  ?MS: No edema; No deformity. ?Neuro:  Nonfocal, demonstrate expressive aphasia ?Psych: Normal affect  ? ?Labs  ?  ?High Sensitivity Troponin:  No results for input(s): TROPONINIHS in the last 720 hours.   ?Chemistry ?Recent Labs  ?Lab 09/25/21 ?1840 09/25/21 ?1905 09/26/21 ?1031 09/27/21 ?5945 09/28/21 ?0359  ?NA 140   < > 140 139 138  ?K 4.3   < > 3.3* 5.0 4.2  ?CL 104   < > 109 107 104  ?CO2 25  --  '23 24 27  '$ ?GLUCOSE 102*   < > 104* 85 94  ?BUN 20   < > '15  14 15  '$ ?CREATININE 1.11   < > 0.89 1.03 0.98  ?CALCIUM 9.2  --  8.4* 8.5* 8.7*  ?MG  --   --   --  2.1 1.9  ?PROT 6.6  --   --   --   --   ?ALBUMIN 3.9  --   --   --   --   ?AST 28  --   --   --   --   ?ALT 25  --   --   --   --   ?ALKPHOS 72  --   --   --   --   ?BILITOT 0.9  --   --   --   --   ?GFRNONAA >60  --  >60 >60 >60  ?ANIONGAP 11  --  '8 8 7  '$ ? < > = values in this interval not displayed.  ?  ?Lipids  ?Recent Labs  ?Lab 09/26/21 ?0542  ?CHOL 155  ?TRIG 96  92  ?HDL 36*  ?LDLCALC 100*  ?CHOLHDL 4.3  ?  ?Hematology ?Recent Labs  ?  Lab 09/26/21 ?7209 09/27/21 ?4709 09/28/21 ?0359  ?WBC 4.6 6.3 5.6  ?RBC 4.32 4.24 4.23  ?HGB 12.7* 12.7* 12.6*  ?HCT 37.1* 37.8* 37.5*  ?MCV 85.9 89.2 88.7  ?MCH 29.4 30.0 29.8  ?MCHC 34.2 33.6 33.6  ?RDW 12.5 13.0 12.7  ?PLT 144* 179 142*  ? ?Thyroid No results for input(s): TSH, FREET4 in the last 168 hours.  ?BNPNo results for input(s): BNP, PROBNP in the last 168 hours.  ?DDimer No results for input(s): DDIMER in the last 168 hours.  ? ?Radiology  ?  ?MR ANGIO HEAD WO CONTRAST ? ?Result Date: 09/26/2021 ?CLINICAL DATA:  Stroke, follow up; right MCA occlusion post thrombectomy EXAM: MRI HEAD WITHOUT CONTRAST MRA HEAD WITHOUT CONTRAST TECHNIQUE: Multiplanar, multi-echo pulse sequences of the brain and surrounding structures were acquired without intravenous contrast. Angiographic images of the Circle of Willis were acquired using MRA technique without intravenous contrast. COMPARISON:  No pertinent prior exam. FINDINGS: MRI HEAD Brain: There is restricted diffusion involving the right caudate and lentiform nucleus and a portion of the posterior limb of the right internal capsule. There are a few punctate foci of restricted diffusion in the right frontal and temporal lobes and left frontal lobe. There is no evidence of hemorrhage. No significant mass effect. No hydrocephalus or extra-axial collection. Vascular: Major vessel flow voids at the skull base are preserved. Skull and  upper cervical spine: Normal marrow signal is preserved. Sinuses/Orbits: Right maxillary sinus retention cyst. Orbits are unremarkable. Other: Sella is unremarkable.  Mastoid air cells are clear. MRA HEAD Intracranial internal carotid arteries are patent. There is no longer a right MCA occlusion. Left middle and both anterior cerebral arteries are patent. Intracranial vertebral arteries, basilar artery, posterior cerebral arteries are patent. Bilateral posterior communicating arteries are present. 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 Few additional punctate acute infarcts. No hemorrhage. No residual MCA occlusion post thrombectomy. Electronically Signed   By: Macy Mis M.D.   On: 09/26/2021 15:34  ? ?MR BRAIN WO CONTRAST ? ?Result Date: 09/26/2021 ?CLINICAL DATA:  Stroke, follow up; right MCA occlusion post thrombectomy EXAM: MRI HEAD WITHOUT CONTRAST MRA HEAD WITHOUT CONTRAST TECHNIQUE: Multiplanar, multi-echo pulse sequences of the brain and surrounding structures were acquired without intravenous contrast. Angiographic images of the Circle of Willis were acquired using MRA technique without intravenous contrast. COMPARISON:  No pertinent prior exam. FINDINGS: MRI HEAD Brain: There is restricted diffusion involving the right caudate and lentiform nucleus and a portion of the posterior limb of the right internal capsule. There are a few punctate foci of restricted diffusion in the right frontal and temporal lobes and left frontal lobe. There is no evidence of hemorrhage. No significant mass effect. No hydrocephalus or extra-axial collection. Vascular: Major vessel flow voids at the skull base are preserved. Skull and upper cervical spine: Normal marrow signal is preserved. Sinuses/Orbits: Right maxillary sinus retention cyst. Orbits are unremarkable. Other: Sella is unremarkable.  Mastoid air cells are clear. MRA HEAD Intracranial  internal carotid arteries are patent. There is no longer a right MCA occlusion. Left middle and both anterior cerebral arteries are patent. Intracranial vertebral arteries, basilar artery, posterior cerebral arteries are patent. Bilateral posterior communicating arteries are present. 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 Few additional punctate acute infarcts. No hemorrhage. No residual MCA occlusion post thrombectomy. Electronically Signed   By:  Macy Mis M.D.   On: 09/26/2021 15:34   ? ?Cardiac Studies  ? ?Echo 09/26/21 ? 1. No left ventricular thrpmbus is seen (Definity was not administered).  ?There is marked septal-lateral dyssynchrony. Left ventricular ejection  ?fraction, by estimation, is 30 to 35%. The left ventricle has moderately  ?decreased function. The left  ?ventricle demonstrates global hypokinesis. The left ventricular internal  ?cavity size was moderately dilated. Left ventricular diastolic function  ?could not be evaluated.  ? 2. Right ventricular systolic function is normal. The right ventricular  ?size is mildly enlarged. Device lead is visualized in the right ventricle  ?and in the coronary sinus. Tricuspid regurgitation signal is inadequate  ?for assessing PA pressure.  ? 3. Left atrial size was severely dilated.  ? 4. Right atrial size was severely dilated.  ? 5. The mitral valve is normal in structure. Trivial mitral valve  ?regurgitation.  ? 6. The aortic valve is tricuspid. Aortic valve regurgitation is not  ?visualized. No aortic stenosis is present.  ? 7. The inferior vena cava is dilated in size with <50% respiratory  ?variability, suggesting right atrial pressure of 15 mmHg.  ? ?Comparison(s): A prior study was performed on 08/03/2021. The left  ?ventricular function is significantly worse. Atrial fibrillation is new  ?since the January 2023 study. The degree of LV dyssynchrony is worse. The  ?paced QRS  complex appears broader (155 ms  ? versus 123 ms in January), suggesting loss of LV lead capture. In  ?addition there are frequent PVCs or native conducted beats with broad QRS  ?complex, not seen on the prev

## 2021-09-28 NOTE — Plan of Care (Signed)
Pt is alert oriented x 3-4. Ambulatory, equal strength bilaterally. Pt has some expressive aphasia. Pt takes medications crushed meds in puree. No distress noted. Head of the bed elevated call button within reach.  ? ?Problem: Education: ?Goal: Knowledge of General Education information will improve ?Description: Including pain rating scale, medication(s)/side effects and non-pharmacologic comfort measures ?Outcome: Progressing ?  ?Problem: Health Behavior/Discharge Planning: ?Goal: Ability to manage health-related needs will improve ?Outcome: Progressing ?  ?Problem: Clinical Measurements: ?Goal: Ability to maintain clinical measurements within normal limits will improve ?Outcome: Progressing ?Goal: Will remain free from infection ?Outcome: Progressing ?Goal: Diagnostic test results will improve ?Outcome: Progressing ?Goal: Respiratory complications will improve ?Outcome: Progressing ?Goal: Cardiovascular complication will be avoided ?Outcome: Progressing ?  ?Problem: Activity: ?Goal: Risk for activity intolerance will decrease ?Outcome: Progressing ?  ?Problem: Nutrition: ?Goal: Adequate nutrition will be maintained ?Outcome: Progressing ?  ?Problem: Coping: ?Goal: Level of anxiety will decrease ?Outcome: Progressing ?  ?Problem: Elimination: ?Goal: Will not experience complications related to bowel motility ?Outcome: Progressing ?Goal: Will not experience complications related to urinary retention ?Outcome: Progressing ?  ?Problem: Pain Managment: ?Goal: General experience of comfort will improve ?Outcome: Progressing ?  ?Problem: Safety: ?Goal: Ability to remain free from injury will improve ?Outcome: Progressing ?  ?Problem: Skin Integrity: ?Goal: Risk for impaired skin integrity will decrease ?Outcome: Progressing ?  ?Problem: Education: ?Goal: Knowledge of disease or condition will improve ?Outcome: Progressing ?Goal: Knowledge of secondary prevention will improve (SELECT ALL) ?Outcome: Progressing ?Goal:  Knowledge of patient specific risk factors will improve (INDIVIDUALIZE FOR PATIENT) ?Outcome: Progressing ?Goal: Individualized Educational Video(s) ?Outcome: Progressing ?  ?Problem: Coping: ?Goal: Will identify appropriate support needs ?Outcome: Progressing ?  ?Problem: Health Behavior/Discharge Planning: ?Goal: Ability to manage health-related needs will improve ?Outcome: Progressing ?  ?Problem: Self-Care: ?Goal: Ability to participate in self-care as condition permits will improve ?Outcome: Progressing ?Goal: Verbalization of feelings and concerns over difficulty with self-care will improve ?Outcome: Progressing ?Goal: Ability to communicate needs accurately will improve ?Outcome: Progressing ?  ?Problem: Nutrition: ?Goal: Risk of aspiration will decrease ?Outcome: Progressing ?  ?Problem: Ischemic Stroke/TIA Tissue Perfusion: ?Goal: Complications of ischemic stroke/TIA will be minimized ?Outcome: Progressing ?  ?

## 2021-09-28 NOTE — TOC Benefit Eligibility Note (Signed)
Patient Advocate Encounter ? ?Insurance verification completed.   ? ?The patient is currently admitted and upon discharge could be taking Eliquis 5 mg. ? ?The current 30 day co-pay is, $47.00.  ? ?The patient is currently admitted and upon discharge could be taking Farxiga 10 mg. ? ?The current 30 day co-pay is, $47.00.  ? ?The patient is currently admitted and upon discharge could be taking Jardiance 10 mg. ? ?The current 30 day co-pay is, $47.00.  ? ?The patient is currently admitted and upon discharge could be taking Entrsto 24-26 mg. ? ?Requires Prior Authorization ? ?The patient is insured through [employer]  ? ? ? ?Lyndel Safe, CPhT ?Pharmacy Patient Advocate Specialist ?[facility] Patient Advocate Team ?Direct Number: [phone]  Fax: [phone] ? ? ? ? ? ?  ?

## 2021-09-28 NOTE — Discharge Summary (Addendum)
Stroke Discharge Summary  ?Patient ID: Ricardo Jones    l ?  MRN: [id number]    ?  DOB: 12/10/1970 ? ?Date of Admission: 09/25/2021 ?Date of Discharge: 09/28/2021 ? ?Attending Physician:  Stroke, Md, MD, Stroke MD ?Consultant(s):    cardiology and pulmonary/intensive care  ?Patient's PCP:  Patient, No Pcp Per (Inactive) ? ?DISCHARGE DIAGNOSIS: right MCA stroke s/p thrombectomy ?Principal Problem: ?  Stroke:  right MCA infarct due to right M1 occlusion s/p IR with TICI3 likely secondary to atrial flutter and cardiomyopathy not on AC ? ?Active Problems: ?Cardiomyopathy ?A flutter ?Hypertension ?Hyperlipidemia ? ? ?Allergies as of 09/28/2021   ?No Known Allergies ?  ? ?  ?Medication List  ?  ? ?TAKE these medications   ? ?apixaban 5 MG Tabs tablet ?Commonly known as: ELIQUIS ?Take 1 tablet (5 mg total) by mouth 2 (two) times daily. ?  ?atorvastatin 40 MG tablet ?Commonly known as: LIPITOR ?Take 1 tablet (40 mg total) by mouth daily. ?Start taking on: September 29, 2021 ?  ? ?  ? ? ?LABORATORY STUDIES ?CBC ?   ?Component Value Date/Time  ? WBC 5.6 09/28/2021 0359  ? RBC 4.23 09/28/2021 0359  ? HGB 12.6 (L) 09/28/2021 0359  ? HCT 37.5 (L) 09/28/2021 0359  ? PLT 142 (L) 09/28/2021 0359  ? MCV 88.7 09/28/2021 0359  ? MCH 29.8 09/28/2021 0359  ? MCHC 33.6 09/28/2021 0359  ? RDW 12.7 09/28/2021 0359  ? LYMPHSABS 1.5 09/26/2021 0542  ? MONOABS 0.4 09/26/2021 0542  ? EOSABS 0.2 09/26/2021 0542  ? BASOSABS 0.0 09/26/2021 0542  ? ?CMP ?   ?Component Value Date/Time  ? NA 138 09/28/2021 0359  ? NA 146 (H) 12/15/2018 1041  ? K 4.2 09/28/2021 0359  ? CL 104 09/28/2021 0359  ? CO2 27 09/28/2021 0359  ? GLUCOSE 94 09/28/2021 0359  ? BUN 15 09/28/2021 0359  ? BUN 23 12/15/2018 1041  ? CREATININE 0.98 09/28/2021 0359  ? CALCIUM 8.7 (L) 09/28/2021 0359  ? PROT 6.6 09/25/2021 1840  ? ALBUMIN 3.9 09/25/2021 1840  ? AST 28 09/25/2021 1840  ? ALT 25 09/25/2021 1840  ? ALKPHOS 72 09/25/2021 1840  ? BILITOT 0.9 09/25/2021 1840  ? GFRNONAA >60  09/28/2021 0359  ? GFRAA 100 12/15/2018 1041  ? ?COAGS ?Lab Results  ?Component Value Date  ? INR 1.1 09/25/2021  ? INR 1.16 09/08/2018  ? INR 1.03 08/19/2010  ? ?Lipid Panel ?   ?Component Value Date/Time  ? CHOL 155 09/26/2021 0542  ? TRIG 96 09/26/2021 0542  ? TRIG 92 09/26/2021 0542  ? HDL 36 (L) 09/26/2021 0542  ? CHOLHDL 4.3 09/26/2021 0542  ? VLDL 19 09/26/2021 0542  ? [place] 100 (H) 09/26/2021 0542  ? ?HgbA1C  ?Lab Results  ?Component Value Date  ? HGBA1C 6.0 (H) 09/26/2021  ? ?Urinalysis ?   ?Component Value Date/Time  ? [place] YELLOW 09/26/2021 0423  ? APPEARANCEUR CLEAR 09/26/2021 0423  ? LABSPEC 1.044 (H) 09/26/2021 0423  ? PHURINE 6.0 09/26/2021 0423  ? [place] NEGATIVE 09/26/2021 0423  ? [place] NEGATIVE 09/26/2021 0423  ? [place] NEGATIVE 09/26/2021 0423  ? [place] NEGATIVE 09/26/2021 0423  ? [place] NEGATIVE 09/26/2021 0423  ? NITRITE NEGATIVE 09/26/2021 0423  ? LEUKOCYTESUR NEGATIVE 09/26/2021 0423  ? ?Urine Drug Screen  ?   ?Component Value Date/Time  ? [place] DETECTED 09/26/2021 0423  ? [place] DETECTED 09/26/2021 0423  ? LABBENZ NONE DETECTED 09/26/2021 0423  ? AMPHETMU  NONE DETECTED 09/26/2021 0423  ? [place] DETECTED 09/26/2021 0423  ? LABBARB NONE DETECTED 09/26/2021 0423  ?  ?Alcohol Level ?   ?Component Value Date/Time  ? ETH <10 09/25/2021 2150  ? ? ? ?SIGNIFICANT DIAGNOSTIC STUDIES ?MR ANGIO HEAD WO CONTRAST ? ?Result Date: 09/26/2021 ?CLINICAL DATA:  Stroke, follow up; right MCA occlusion post thrombectomy EXAM: MRI HEAD WITHOUT CONTRAST MRA HEAD WITHOUT CONTRAST TECHNIQUE: Multiplanar, multi-echo pulse sequences of the brain and surrounding structures were acquired without intravenous contrast. Angiographic images of the Circle of Willis were acquired using MRA technique without intravenous contrast. COMPARISON:  No pertinent prior exam. FINDINGS: MRI HEAD Brain: There is restricted diffusion involving the right caudate and lentiform nucleus and a portion of  the posterior limb of the right internal capsule. There are a few punctate foci of restricted diffusion in the right frontal and temporal lobes and left frontal lobe. There is no evidence of hemorrhage. No significant mass effect. No hydrocephalus or extra-axial collection. Vascular: Major vessel flow voids at the skull base are preserved. Skull and upper cervical spine: Normal marrow signal is preserved. Sinuses/Orbits: Right maxillary sinus retention cyst. Orbits are unremarkable. Other: Sella is unremarkable.  Mastoid air cells are clear. MRA HEAD Intracranial internal carotid arteries are patent. There is no longer a right MCA occlusion. Left middle and both anterior cerebral arteries are patent. Intracranial vertebral arteries, basilar artery, posterior cerebral arteries are patent. Bilateral posterior communicating arteries are present. 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 Few additional punctate acute infarcts. No hemorrhage. No residual MCA occlusion post thrombectomy. Electronically Signed   By: Macy Mis M.D.   On: 09/26/2021 15:34  ? ?MR BRAIN WO CONTRAST ? ?Result Date: 09/26/2021 ?CLINICAL DATA:  Stroke, follow up; right MCA occlusion post thrombectomy EXAM: MRI HEAD WITHOUT CONTRAST MRA HEAD WITHOUT CONTRAST TECHNIQUE: Multiplanar, multi-echo pulse sequences of the brain and surrounding structures were acquired without intravenous contrast. Angiographic images of the Circle of Willis were acquired using MRA technique without intravenous contrast. COMPARISON:  No pertinent prior exam. FINDINGS: MRI HEAD Brain: There is restricted diffusion involving the right caudate and lentiform nucleus and a portion of the posterior limb of the right internal capsule. There are a few punctate foci of restricted diffusion in the right frontal and temporal lobes and left frontal lobe. There is no evidence of hemorrhage. No significant  mass effect. No hydrocephalus or extra-axial collection. Vascular: Major vessel flow voids at the skull base are preserved. Skull and upper cervical spine: Normal marrow signal is preserved. Sinuses/Orbits: Right maxillary sinus retention cyst. Orbits are unremarkable. Other: Sella is unremarkable.  Mastoid air cells are clear. MRA HEAD Intracranial internal carotid arteries are patent. There is no longer a right MCA occlusion. Left middle and both anterior cerebral arteries are patent. Intracranial vertebral arteries, basilar artery, posterior cerebral arteries are patent. Bilateral posterior communicating arteries are present. 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 Few additional punctate acute infarcts. No hemorrhage. No residual MCA occlusion post thrombectomy. Electronically Signed   By: Macy Mis M.D.   On: 09/26/2021 15:34  ? ?[place] ? ?Result Date: 09/27/2021 ?INDICATION: New onset of gaze deviation, left-sided weakness and unresponsiveness. Occluded right middle cerebral artery on CT angiogram of the head and neck. EXAM: 1. EMERGENT LARGE VESSEL OCCLUSION THROMBOLYSIS (anterior CIRCULATION) COMPARISON:  CT  angiogram of the head and neck of September 25, 2021. MEDICATIONS: Ancef 2 g IV antibiotic was administered within 1 hour of the procedure. ANESTHESIA/SEDATION: General anesthesia. CONTRAST:  Omnipaque 300 approximately 60 mL. FLUOROSCOPY TIME:  Fluoroscopy Time: 17 minutes 6 seconds (1034 mGy). COMPLICATIONS: None immediate. TECHNIQUE: Following a full explanation of the procedure along with the potential associated complications, an informed witnessed consent was obtained. The risks of intracranial hemorrhage of 10%, worsening neurological deficit, ventilator dependency, death and inability to revascularize were all reviewed in detail with the patient's spouse. The patient was then put under general anesthesia by the  Department of Anesthesiology at [facility]. The right groin was prepped and draped in the usual sterile fashion. Thereafter using modified Seldinger technique, transfemoral access into the right common f

## 2021-09-28 NOTE — Progress Notes (Signed)
Occupational Therapy Treatment ?Patient Details ?Name: Ricardo Jones ?MRN: [id number] ?DOB: 1970/11/12 ?Today's Date: 09/28/2021 ? ? ?History of present illness Pt is 51 yo male who presents with R gaze deviation, aphasia, and L side [facility] Received TNK and underwent revascularization R MCA.  PMH: bradycardia, a flutter, CHF, cardiac sarcoidosis ?  ?OT comments ? Pt making good progress with OT goals. He was seen this session to further assess cognition, as well as provide fine motor activities that he could work on at home. Cognition appears improved, however pt continues to report that he feels foggy and slow. He is able to follow multistep functional commands with no difficulties and was fully oriented this session. 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 Pt was provided multiple fine motor exercises listed below in exercise section. OT will continue to follow acutely.   ? ?Recommendations for follow up therapy are one component of a multi-disciplinary discharge planning process, led by the attending physician.  Recommendations may be updated based on patient status, additional functional criteria and insurance authorization. ?   ?Follow Up Recommendations ? Outpatient OT ([facility])  ?  ?Assistance Recommended at Discharge Frequent or constant Supervision/Assistance  ?Patient can return home with the following ? Assist for transportation ?  ?Equipment Recommendations ? None recommended by OT  ?  ?Recommendations for Other Services   ? ?  ?Precautions / Restrictions Precautions ?Precautions: None ?Restrictions ?Weight Bearing Restrictions: No  ? ? ?  ? ?Mobility Bed Mobility ?Overal bed mobility: Independent ?  ?  ?  ?  ?  ?  ?  ?  ? ?Transfers ?Overall transfer level: Independent ?Equipment used: None ?Transfers: Sit to/from Stand ?Sit to Stand: Independent ?  ?  ?  ?  ?  ?General transfer comment: Independent  with all functional mobility ?  ?  ?Balance Overall balance assessment: Independent ?  ?  ?  ?  ?  ?  ?  ?  ?  ?  ?  ?  ?  ?  ?  ?  ?  ?  ?   ? ?ADL either performed or assessed with clinical judgement  ? ?ADL Overall ADL's : Independent ?  ?  ?  ?  ?  ?  ?  ?  ?  ?  ?  ?  ?  ?  ?  ?  ?  ?  ?  ?General ADL Comments: pt demonstrated indep ability to complete ADLs; need to further assess higher level IADLs and executive functioning tasks ?  ? ?Extremity/Trunk Assessment   ?  ?  ?  ?  ?  ? ?Vision   ?  ?  ?Perception   ?  ?Praxis   ?  ? ?Cognition Arousal/Alertness: Awake/alert ?Behavior During Therapy: Flat affect ?Overall Cognitive Status: Within Functional Limits for tasks assessed ?  ?  ?  ?  ?  ?  ?  ?  ?  ?  ?  ?  ?  ?  ?  ?  ?General Comments: Pt reporting that he still feels cloudy/slow, however wife reports that cognition is much improved from days prior. He followed multistep commands with no difficulty, Able to answer orientation based questions with no difficulty. ?  ?  ?   ?Exercises Exercises: Other exercises ?Other Exercises ?Other Exercises: Drawing with a thin pen ?Other Exercises: shuffling a penny in  between fingers on LUE, pick up pennies from tray ?Other Exercises: Red theraputty - roll into ball, pinch with each finger, roll into log, pinch down the long ?Other Exercises: Educated on dry rice and beans in a bowl and picking out the beans. ? ?  ?Shoulder Instructions   ? ? ?  ?General Comments VSS on RA  ? ? ?Pertinent Vitals/ Pain       Pain Assessment ?Pain Assessment: No/denies pain ? ?Home Living   ?  ?  ?  ?  ?  ?  ?  ?  ?  ?  ?  ?  ?  ?  ?  ?  ?  ?  ? ?  ?Prior Functioning/Environment    ?  ?  ?  ?   ? ?Frequency ? Min 2X/week  ? ? ? ? ?  ?Progress Toward Goals ? ?OT Goals(current goals can now be found in the care plan section) ? Progress towards OT goals: Progressing toward goals ? ?Acute Rehab OT Goals ?Patient Stated Goal: To get back to "normal" ?OT Goal Formulation: With patient ?Time  For Goal Achievement: 10/11/21 ?Potential to Achieve Goals: Good ?ADL Goals ?Additional ADL Goal #1: Pt will independently complete an IADL medication management task ?Additional ADL Goal #2: Pt will demonstrate improved working memory by indpendently carrying out a 5 step multi-directional task  ?Plan Discharge plan remains appropriate;Frequency remains appropriate   ? ?Co-evaluation ? ? ?   ?  ?  ?  ?  ? ?  ?AM-PAC OT "6 Clicks" Daily Activity     ?Outcome Measure ? ? Help from another person eating meals?: None ?Help from another person taking care of personal grooming?: None ?Help from another person toileting, which includes using toliet, bedpan, or urinal?: None ?Help from another person bathing (including washing, rinsing, drying)?: None ?Help from another person to put on and taking off regular upper body clothing?: None ?Help from another person to put on and taking off regular lower body clothing?: None ?6 Click Score: 24 ? ?  ?End of Session   ? ?OT Visit Diagnosis: Cognitive communication deficit (R41.841) ?Symptoms and signs involving cognitive functions: Cerebral infarction ?  ?Activity Tolerance Patient tolerated treatment well ?  ?Patient Left in bed;with call bell/phone within reach;with family/visitor present ?  ?Nurse Communication Mobility status ?  ? ?   ? ?Time: 4696-2952 ?OT Time Calculation (min): 14 min ? ?Charges: OT General Charges ?$OT Visit: 1 Visit ?OT Treatments ?$Therapeutic Activity: 8-22 mins ? ?Karen Kinnard H., OTR/L ?Acute Rehabilitation ? ?Ricardo Jones ?09/28/2021, 2:47 PM ?

## 2021-09-28 NOTE — TOC Transition Note (Signed)
Transition of Care (TOC) - CM/SW Discharge Note ? ? ?Patient Details  ?Name: Ricardo Jones ?MRN: [id number] ?Date of Birth: 1971-05-29 ? ?Transition of Care (TOC) CM/SW Contact:  ?Pollie Friar, RN ?Phone Number: ?09/28/2021, 2:15 PM ? ? ?Clinical Narrative:    ?PCP: [facility]--wife to get him a hospital follow up appointment ?Patient is discharging home with spouse. He has intermittent supervision at home.  ?Patient denies any issues with home medications or transportation.  ?Pt will receive first 30 days of Eliquis for free through [facility]. Cm provide him with $10 co pay card.  ?Outpatient therapy arranged through [facility] with information on the AVS.  ?Wife to provide transport home. ? ? ? ?Final next level of care: OP Rehab ?Barriers to Discharge: No Barriers Identified ? ? ?Patient Goals and CMS Choice ?  ?  ?Choice offered to / list presented to : Patient, Spouse ? ?Discharge Placement ?  ?           ?  ?  ?  ?  ? ?Discharge Plan and Services ?  ?  ?           ?  ?  ?  ?  ?  ?  ?  ?  ?  ?  ? ?Social Determinants of Health (SDOH) Interventions ?  ? ? ?Readmission Risk Interventions ?No flowsheet data found. ? ? ? ? ?

## 2021-09-29 NOTE — TOC CAGE-AID Note (Signed)
Transition of Care (TOC) - CAGE-AID Screening ? ? ?Patient Details  ?Name: Ricardo Jones ?MRN: [id number] ?Date of Birth: [birth year] ? ?Transition of Care (TOC) CM/SW Contact:    ?Dlisa Barnwell C Tarpley-Carter, LCSWA ?Phone Number: ?09/29/2021, 11:26 AM ? ? ?Clinical Narrative: ?Pt participated in [place].  Pt stated he does not use substance or ETOH (socially).  Pt was not offered resources, due to no usage of substance or ETOH.    ? ?Passenger transport manager, MSW, LCSW-A ?Pronouns:  She/Her/Hers ?[facility] ?Clinical Social Worker ?Direct Number:  [phone] ?[email or web address] ? ?CAGE-AID Screening: ?Substance Abuse Screening unable to be completed due to: : Patient unable to participate ? ?Have You Ever Felt You Ought to Cut Down on Your Drinking or Drug Use?: No ?Have People Annoyed You By Critizing Your Drinking Or Drug Use?: No ?Have You Felt Bad Or Guilty About Your Drinking Or Drug Use?: No ?Have You Ever Had a Drink or Used Drugs First Thing In The Morning to Steady Your Nerves or to Get Rid of a Hangover?: No ?CAGE-AID Score: 0 ? ?Substance Abuse Education Offered: No ? ?  ? ? ? ? ? ? ?

## 2021-10-01 NOTE — Progress Notes (Signed)
? ?Cardiology Office Note ?Date:  10/01/2021  ?Patient ID:  Ricardo Jones, DOB [birth year], MRN [id number] ?PCP:  Patient, No Pcp Per (Inactive)  ?Cardiologist:  Dr. Acie Fredrickson ?Electrophysiologist: Dr. Caryl Comes ? ?  ?Chief Complaint: post hospital, device optimization ? ?History of Present Illness: ?BRETTON Jones is a 51 y.o. male with history of CHB, AFlutter, CM, stroke, PVCs, CRT-D ? ?He comes in today to be seen for Dr. Caryl Comes, last seen by him Dec 2022, lengthy note with discussion +/- sarcoid, though seems not a clear or confirmed diagnosis, his Mom with known LMN + genetic testing, and presumed his CM the same or sarcoid, though had not been tested, perhaps did not want to pursue this. ?Discussed his Atrial arrhythmia, reported some "weird" SOB ?His rate response adjusted ?Discussed risk score of one 2/2 some LV dysfunction, not on a/c ? ? ?DATE TEST EF    ?7/15 Echo   50-55 % Severe RAE/LAE  ?5/18 Echo  40-45 % Mild RAE/LAE  ?10/19 Echo  40-45%    ?11/19 cMRI   Severe BAE DGE+>Sarcoid  ?12/19 cPET   + Consistent Sarcoid  ?8/20 Echo  50-55%    ?         ? ? ? ?He was [facility] to [facility] with L sided weakness, aphasia found with stroke, underwent TNKase & thrombectomy, during his w/u, cardiology was consulted noting a reduction in his LVEF on his echo, and some concern that his LV lead perhaps not functioning. ?Device was interrogated by industry with intact function of his device and lead. ?Recommended start of a/c when deemed safe to do so by neurology as well as GDMT when out of permissive HTN window post stroke. ? ?Planned for [facility] HF clinic follow up post discharge and EP for device eval and perhaps optimization with a broad QRS on EKG ? ?Discharged 09/28/21 on eliquis, lipitor ? ?TODAY ?He is accompanied by his wife. ?He is doing well since his stroke, has had remarkable recovery. ?NO CP, palpitations or cardiaca awareness. ?He mentions a couple weeks ago his feet were swollen, but resolved now. ?No  particular SOB ? ? ?Device information ?MDT CRT-D implanted 09/08/2018 ?ABBOTT LV lead ? ?Past Medical History:  ?Diagnosis Date  ? Bradycardia   ? asymptomatic  ? S/P dilatation of esophageal stricture   ? Typical atrial flutter ([place])   ? ? ?Past Surgical History:  ?Procedure Laterality Date  ? BIV ICD INSERTION CRT-D N/A 09/08/2018  ? Procedure: BIV ICD INSERTION CRT-D;  Surgeon: Evans Lance, MD;  Location: [facility];  Service: Cardiovascular;  Laterality: N/A;  ? IR CT HEAD LTD  09/25/2021  ? IR PERCUTANEOUS ART THROMBECTOMY/INFUSION INTRACRANIAL INC DIAG ANGIO  09/25/2021  ? RADIOLOGY WITH ANESTHESIA N/A 09/25/2021  ? Procedure: IR WITH ANESTHESIA;  Surgeon: Luanne Bras, MD;  Location: [facility];  Service: Radiology;  Laterality: N/A;  ? THROAT SURGERY  2012  ? ? ?Current Outpatient Medications  ?Medication Sig Dispense Refill  ? apixaban (ELIQUIS) 5 MG TABS tablet Take 1 tablet (5 mg total) by mouth 2 (two) times daily. 60 tablet 1  ? atorvastatin (LIPITOR) 40 MG tablet Take 1 tablet (40 mg total) by mouth daily. 30 tablet 1  ? carvedilol (COREG) 3.125 MG tablet Take 1 tablet (3.125 mg total) by mouth 2 (two) times daily with a meal. 60 tablet 5  ? ?No current facility-administered medications for this visit.  ? ? ?Allergies:   Patient has no known  allergies.  ? ?Social History:  The patient  reports that he has never smoked. He has never used smokeless tobacco. He reports current alcohol use. He reports that he does not use drugs.  ? ?Family History:  The patient's family history includes Asthma in his mother; Bradycardia in an other family member; Cancer in his maternal grandfather, maternal grandmother, and paternal grandmother. ? ?ROS:  Please see the history of present illness.    ?All other systems are reviewed and otherwise negative.  ? ?PHYSICAL EXAM:  ?VS:  There were no vitals taken for this visit. BMI: There is no height or weight on file to calculate BMI. ?Well nourished, well developed,  in no acute distress ?HEENT: normocephalic, atraumatic ?Neck: no JVD, carotid bruits or masses ?Cardiac:  RRR; no significant murmurs, no rubs, or gallops ?Lungs:  CTA b/l, no wheezing, rhonchi or rales ?Abd: soft, nontender ?MS: no deformity or atrophy ?Ext: no edema ?Skin: warm and dry, no rash ?Neuro:  No gross deficits appreciated ?Psych: euthymic mood, full affect ? ?ICD site is stable, no tethering or discomfort ? ? ?EKG:  done today and reviewed by myself ?AV paced 71 ? ?Device interrogation done today and reviewed by myself:  ?Battery and lead testing are good ?No arrhythmias (since checked last week) ?Last AFlutter episodes were on 3/10, 2 episodes ?12mn, 141m ?Prior to that back in Feb, all quite short ?AF burden has markedly reduced since last year ? ? ?Echo 09/26/21 ? 1. No left ventricular thrpmbus is seen (Definity was not administered).  ?There is marked septal-lateral dyssynchrony. Left ventricular ejection  ?fraction, by estimation, is 30 to 35%. The left ventricle has moderately  ?decreased function. The left  ?ventricle demonstrates global hypokinesis. The left ventricular internal  ?cavity size was moderately dilated. Left ventricular diastolic function  ?could not be evaluated.  ? 2. Right ventricular systolic function is normal. The right ventricular  ?size is mildly enlarged. Device lead is visualized in the right ventricle  ?and in the coronary sinus. Tricuspid regurgitation signal is inadequate  ?for assessing PA pressure.  ? 3. Left atrial size was severely dilated.  ? 4. Right atrial size was severely dilated.  ? 5. The mitral valve is normal in structure. Trivial mitral valve  ?regurgitation.  ? 6. The aortic valve is tricuspid. Aortic valve regurgitation is not  ?visualized. No aortic stenosis is present.  ? 7. The inferior vena cava is dilated in size with <50% respiratory  ?variability, suggesting right atrial pressure of 15 mmHg.  ? ?Comparison(s): A prior study was performed on  08/03/2021. The left  ?ventricular function is significantly worse. Atrial fibrillation is new  ?since the January 2023 study. The degree of LV dyssynchrony is worse. The  ?paced QRS complex appears broader (155 ms  ? versus 123 ms in January), suggesting loss of LV lead capture. In  ?addition there are frequent PVCs or native conducted beats with broad QRS  ?complex, not seen on the previos study.  ? ?Conclusion(s)/Recommendation(s): Recommend CRT device interrogation and  ?assessment of LV capture.  ? ? ?08/03/2021 TTE ? 1. Left ventricular ejection fraction, by estimation, is 45 to 50%. The  ?left ventricle has mildly decreased function. The left ventricle  ?demonstrates global hypokinesis. The left ventricular internal cavity size  ?was mildly dilated. There is mild left  ?ventricular hypertrophy. Left ventricular diastolic parameters are  ?indeterminate. The average left ventricular global longitudinal strain is  ?-15.4 %.  ? 2. Right ventricular systolic function  is normal. The right ventricular  ?size is normal. Tricuspid regurgitation signal is inadequate for assessing  ?PA pressure.  ? 3. Left atrial size was moderately dilated.  ? 4. Right atrial size was moderately dilated.  ? 5. The mitral valve is normal in structure. Mild mitral valve  ?regurgitation. No evidence of mitral stenosis.  ? 6. The aortic valve is normal in structure. Aortic valve regurgitation is  ?not visualized. No aortic stenosis is present.  ? 7. The inferior vena cava is dilated in size with >50% respiratory  ?variability, suggesting right atrial pressure of 8 mmHg.  ? ? ?07/07/2018: High Res CT ?IMPRESSION: ?1. No findings to indicate sarcoid. ?2. 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 ?  ?07/02/2018: Cardiac PET ? FINAL COMMENTS  ? There is a snmall are in the basal segment of the inferior with mildly decreased perfusion. On the metabolic study there abnormal  ? FDG upteke consistent with inflamatory  process of the myocardium. More especificaly involving the basal-inferior, Basal infero  ? septal and mid infero-septal walls. In the porper clinical settings this may represent active sarcoidosis in the [facility]

## 2021-10-02 ENCOUNTER — Other Ambulatory Visit: Payer: Self-pay

## 2021-10-02 ENCOUNTER — Encounter: Payer: Self-pay | Admitting: Physician Assistant

## 2021-10-02 ENCOUNTER — Ambulatory Visit: Payer: BC Managed Care – PPO | Admitting: Physician Assistant

## 2021-10-02 VITALS — BP 130/78 | HR 76 | Ht 79.0 in | Wt 252.0 lb

## 2021-10-02 DIAGNOSIS — I4892 Unspecified atrial flutter: Secondary | ICD-10-CM

## 2021-10-02 DIAGNOSIS — Z95810 Presence of automatic (implantable) cardiac defibrillator: Secondary | ICD-10-CM

## 2021-10-02 DIAGNOSIS — I428 Other cardiomyopathies: Secondary | ICD-10-CM | POA: Diagnosis not present

## 2021-10-02 DIAGNOSIS — I442 Atrioventricular block, complete: Secondary | ICD-10-CM | POA: Diagnosis not present

## 2021-10-02 LAB — CUP PACEART INCLINIC DEVICE CHECK
Battery Remaining Longevity: 41 mo
Battery Voltage: 2.95 V
Brady Statistic AP VP Percent: 99.73 %
Brady Statistic AP VS Percent: 0.21 %
Brady Statistic AS VP Percent: 0.05 %
Brady Statistic AS VS Percent: 0 %
Brady Statistic RA Percent Paced: 99.88 %
Brady Statistic RV Percent Paced: 96.15 %
Date Time Interrogation Session: 20230320173815
HighPow Impedance: 77 Ohm
Implantable Lead Implant Date: 20200224
Implantable Lead Implant Date: 20200224
Implantable Lead Implant Date: 20200224
Implantable Lead Location: 753858
Implantable Lead Location: 753859
Implantable Lead Location: 753860
Implantable Lead Model: 5076
Implantable Lead Model: 6935
Implantable Pulse Generator Implant Date: 20200224
Lead Channel Impedance Value: 223.149
Lead Channel Impedance Value: 231.878
Lead Channel Impedance Value: 235.98 Ohm
Lead Channel Impedance Value: 237.12 Ohm
Lead Channel Impedance Value: 241.412
Lead Channel Impedance Value: 266 Ohm
Lead Channel Impedance Value: 342 Ohm
Lead Channel Impedance Value: 399 Ohm
Lead Channel Impedance Value: 437 Ohm
Lead Channel Impedance Value: 456 Ohm
Lead Channel Impedance Value: 494 Ohm
Lead Channel Impedance Value: 513 Ohm
Lead Channel Impedance Value: 779 Ohm
Lead Channel Impedance Value: 836 Ohm
Lead Channel Impedance Value: 855 Ohm
Lead Channel Impedance Value: 855 Ohm
Lead Channel Impedance Value: 855 Ohm
Lead Channel Impedance Value: 893 Ohm
Lead Channel Pacing Threshold Amplitude: 0.875 V
Lead Channel Pacing Threshold Amplitude: 0.875 V
Lead Channel Pacing Threshold Pulse Width: 0.4 ms
Lead Channel Pacing Threshold Pulse Width: 0.4 ms
Lead Channel Sensing Intrinsic Amplitude: 0.875 mV
Lead Channel Sensing Intrinsic Amplitude: 0.875 mV
Lead Channel Sensing Intrinsic Amplitude: 11.875 mV
Lead Channel Sensing Intrinsic Amplitude: 11.875 mV
Lead Channel Setting Pacing Amplitude: 1.75 V
Lead Channel Setting Pacing Amplitude: 2.5 V
Lead Channel Setting Pacing Amplitude: 2.5 V
Lead Channel Setting Pacing Pulse Width: 0.4 ms
Lead Channel Setting Pacing Pulse Width: 0.6 ms
Lead Channel Setting Sensing Sensitivity: 0.3 mV

## 2021-10-02 NOTE — Patient Instructions (Signed)
Medication Instructions:   Your physician recommends that you continue on your current medications as directed. Please refer to the Current Medication list given to you today.  *If you need a refill on your cardiac medications before your next appointment, please call your pharmacy*   Lab Work: NONE ORDERED  TODAY   If you have labs (blood work) drawn today and your tests are completely normal, you will receive your results only by: . MyChart Message (if you have MyChart) OR . A paper copy in the mail If you have any lab test that is abnormal or we need to change your treatment, we will call you to review the results.   Testing/Procedures: NONE ORDERED  TODAY   Follow-Up: At [facility], you and your health needs are our priority.  As part of our continuing mission to provide you with exceptional heart care, we have created designated Provider Care Teams.  These Care Teams include your primary Cardiologist (physician) and Advanced Practice Providers (APPs -  Physician Assistants and Nurse Practitioners) who all work together to provide you with the care you need, when you need it.  We recommend signing up for the patient portal called "MyChart".  Sign up information is provided on this After Visit Summary.  MyChart is used to connect with patients for Virtual Visits (Telemedicine).  Patients are able to view lab/test results, encounter notes, upcoming appointments, etc.  Non-urgent messages can be sent to your provider as well.   To learn more about what you can do with MyChart, go to [email or web address].    Your next appointment:    AS SCHEDULED  The format for your next appointment:   In Person  Provider:   Steven Klein, MD   Other Instructions   

## 2021-10-03 ENCOUNTER — Telehealth (HOSPITAL_COMMUNITY): Payer: Self-pay

## 2021-10-03 ENCOUNTER — Other Ambulatory Visit (HOSPITAL_COMMUNITY): Payer: Self-pay

## 2021-10-03 NOTE — Telephone Encounter (Signed)
Pharmacy Transitions of Care Follow-up Telephone Call ? ?Date of discharge: 09/28/21  ?Discharge Diagnosis: CVA ? ?How have you been since you were released from the hospital? Patient doing well since discharge, no questions about meds at this time.  ? ?Medication changes made at discharge: ?START taking: ?atorvastatin (LIPITOR)  ?carvedilol (COREG)  ?Eliquis (apixaban)  ? ?Medication changes verified by the patient? Yes ?  ? ?Medication Accessibility: ? ?Home Pharmacy: [facility]  ? ?Was the patient provided with refills on discharged medications? Yes  ? ?Have all prescriptions been transferred from [facility] to home pharmacy? Yes  ? ?Is the patient able to afford medications? Has insurance ?  ? ?Medication Review: ? ?Patient confirmed they received counseling on Eliquis before discharge ?APIXABAN (ELIQUIS)  ?Apixaban 5 mg BID initiated on 09/28/21.  ?- Discussed importance of taking medication around the same time everyday  ?- Advised patient of medications to avoid (NSAIDs, ASA)  ?- Educated that Tylenol (acetaminophen) will be the preferred analgesic to prevent risk of bleeding  ?- Emphasized importance of monitoring for signs and symptoms of bleeding (abnormal bruising, prolonged bleeding, nose bleeds, bleeding from gums, discolored urine, black tarry stools)  ?- Advised patient to alert all providers of anticoagulation therapy prior to starting a new medication or having a procedure  ? ?Follow-up Appointments: ? ?[facility] f/u appt confirmed? Scheduled to see Dr. Caryl Comes on 11/03/21 @ 9:45AM.  ? ?If their condition worsens, is the pt aware to call PCP or go to the Emergency Dept.? Yes ? ?Final Patient Assessment: ?Patient has f/u scheduled, no questions about meds at this time. ? ?

## 2021-10-06 ENCOUNTER — Other Ambulatory Visit: Payer: Self-pay

## 2021-10-06 ENCOUNTER — Ambulatory Visit: Payer: BC Managed Care – PPO | Attending: Neurology | Admitting: Occupational Therapy

## 2021-10-06 ENCOUNTER — Ambulatory Visit: Payer: BC Managed Care – PPO

## 2021-10-06 DIAGNOSIS — R278 Other lack of coordination: Secondary | ICD-10-CM | POA: Diagnosis present

## 2021-10-06 DIAGNOSIS — R4701 Aphasia: Secondary | ICD-10-CM

## 2021-10-06 DIAGNOSIS — M6281 Muscle weakness (generalized): Secondary | ICD-10-CM | POA: Diagnosis present

## 2021-10-06 NOTE — Therapy (Signed)
[facility] ?[address], [address] ?[place], [place], [address] ?Phone: [phone]   Fax:  [phone] ? ?Speech Language Pathology Evaluation ? ?Patient Details  ?Name: Ricardo Jones ?MRN: [id number] ?Date of Birth: 12/30/1970 ?Referring Provider (SLP): Rosalin Hawking, MD ? ? ?Encounter Date: 10/06/2021 ? ? End of Session - 10/06/21 1832   ? ? Visit Number 1   ? Number of Visits 25   ? Date for SLP Re-Evaluation 12/29/21   ? SLP Start Time 1016   ? SLP Stop Time  1105   ? SLP Time Calculation (min) 49 min   ? ?  ?  ? ?  ? ? ?Past Medical History:  ?Diagnosis Date  ? Bradycardia   ? asymptomatic  ? S/P dilatation of esophageal stricture   ? Typical atrial flutter ([place])   ? ? ?Past Surgical History:  ?Procedure Laterality Date  ? BIV ICD INSERTION CRT-D N/A 09/08/2018  ? Procedure: BIV ICD INSERTION CRT-D;  Surgeon: Evans Lance, MD;  Location: [facility];  Service: Cardiovascular;  Laterality: N/A;  ? IR CT HEAD LTD  09/25/2021  ? IR PERCUTANEOUS ART THROMBECTOMY/INFUSION INTRACRANIAL INC DIAG ANGIO  09/25/2021  ? RADIOLOGY WITH ANESTHESIA N/A 09/25/2021  ? Procedure: IR WITH ANESTHESIA;  Surgeon: Luanne Bras, MD;  Location: [facility];  Service: Radiology;  Laterality: N/A;  ? THROAT SURGERY  2012  ? ? ?There were no vitals filed for this visit. ? ? Subjective Assessment - 10/06/21 1820   ? ? Subjective "I don't have any difficulty understanding people, it's finding words."   ? Currently in Pain? No/denies   ? ?  ?  ? ?  ? ? ? ? ? SLP Evaluation [facility] - 10/06/21 1027   ? ?  ? SLP Visit Information  ? SLP Received On 10/06/21   ? Referring Provider (SLP) Rosalin Hawking, MD   ? Onset Date 09-26-21   ? Medical Diagnosis CVAs   ?  ? Subjective  ? Patient/Family Stated Goal Return to work and function in the community as close to baseline as possible   ?  ? General Information  ? HPI On 09/26/21 pt presented to ED with right gaze palsy, aphasia, left-sided weakness. On 09/26/21 MR  ID'd acute infarcts of the right basal ganglia and a portion of the posterior limb of the internal capsule, which pt underwent revascularization. Few additional punctate acute infarcts noted. No hemorrhage.  Pt had acute ST for cont'd anomia, decr'd aud comp with complex topics.  ?  ? Balance Screen  ? Has the patient fallen in the past 6 months No   ?  ? Prior Functional Status  ? Cognitive/Linguistic Baseline Within functional limits   ? Type of Home House   ?  Lives With Spouse   ? Available Support Family;Friend(s)   ? Vocation Full time Advice worker  ?  ? Cognition  ? Overall Cognitive Status Within Functional Limits for tasks assessed   ?  ? Auditory Comprehension  ? Overall Auditory Comprehension Appears within functional limits for tasks assessed. Pt cited examples that he has no difficulty with auditory comprehension.  ?  ? Verbal Expression  ? Overall Verbal Expression Impaired   ? Level of Generative/Spontaneous Verbalization Conversation   ? Naming Impairment   ? Other Verbal Expression Comments In mod complex conversation trials, pt began the utterance and attempted to use other verbal compensatory strategies (synonym, circumlocution) but was  unable to adequately do so and abandoned the task. "I'm sorry, I can't tell you." Pt was unable to provide any detail during evaluation. Picture cues assisted pt to generate some more semantically related information to provide to SLP. In very simple conversation pt was nearly functional or functional.   ?  ? Written Expression  ? Written Expression Exceptions to [facility]   ? Self Formulation Ability Phrase   telegraphic phrases demonstrated today; written discourse about a mod complex topic was non-functional  ? Overall Writen Expression ? ?Reading Comprehension ?  Overall Reading Comprehension Written expression < Verbal expression  ? ? ?Pt indicates no difficulty with reading comprehension and provides examples for SLP.  ?  ? Oral Motor/Sensory Function  ?  Overall Oral Motor/Sensory Function Appears within functional limits for tasks assessed   ?  ? Motor Speech  ? Overall Motor Speech Appears within functional limits for tasks assessed   ?  ? Standardized Assessments  ? Standardized Assessments  Boston Naming Test-2nd edition   ? Boston Naming Test-2nd edition  45/60 (WNL cutoff= 53.8/60)   ? ?  ?  ? ?  ? ? ? ? ? ? ? ? ? ? ? ? ? ? ? ? ? ? SLP Education - 10/06/21 1830   ? ? Education Details therapy apps/websites, focus of therapy, rationale for x2/week   ? Person(s) Educated Patient   ? Methods Explanation;Handout   ? Comprehension Verbalized understanding   ? ?  ?  ? ?  ? ? ? SLP Short Term Goals - 10/06/21 1956   ? ?  ? SLP SHORT TERM GOAL #1  ? Title pt will write senence level information with 80% success and rare min A in two sessions   ? Time 4   ? Period Weeks   ? Status New   ? Target Date 11/03/21   ?  ? SLP SHORT TERM GOAL #2  ? Title pt will give sentence level descriptions with 90% success and modified independence in 2 sessions   ? Time 4   ? Period Weeks   ? Status New   ? Target Date 11/03/21   ? ?  ?  ? ?  ? ? ? SLP Long Term Goals - 10/06/21 2001   ? ?  ? SLP LONG TERM GOAL #1  ? Title pt will write 3-4 sentence paragraphs with 90% success with nonverbal cues   ? Time 8   ? Period Weeks   ? Status New   ? Target Date 12/01/21   ?  ? SLP LONG TERM GOAL #2  ? Title pt will write 6-7 sentence paragraphs with 90% success and self correction in 3 sessions   ? Time 12   ? Period Weeks   ? Status New   ? Target Date 12/29/21   ?  ? SLP LONG TERM GOAL #3  ? Title pt will demo functional mod complex conversation with modified independence in 3 sessions   ? Time 8   ? Period Weeks   ? Status New   ? Target Date 12/01/21   ?  ? SLP LONG TERM GOAL #4  ? Title pt will engage in 15 minutes mod complex-complex conversation with modified independence in 3 sessions   ? Time 12   ? Period Weeks   ? Status New   ? Target Date 12/29/21   ? ?  ?  ? ?  ? ? ? Plan -  10/06/21 1832   ? ?  Clinical Impression Statement Pt presents today with mod verbal expression deficit, and mod-severe written language deficit. Pt admitted "I wasn't - that - great a speller before this" but states he is having more difficulty with spelling now than previously. Simple conversation was near-functional or functional however pt was unable to express any description of any item without picture cue, and then was able to provide 2-3 details (e.g., "It has embroidery"). 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 "I do a lot of writing," pt stated. Pt would benefit from skilled ST targeting language expression (verbal and written).   ? Speech Therapy Frequency 2x / week   ? Duration 12 weeks   ? Treatment/Interventions Functional tasks;Patient/family education;Compensatory strategies;Multimodal communcation approach;Environmental controls;SLP instruction and feedback;Language facilitation   ? Potential to Achieve Goals Good   ? SLP Home Exercise Plan therapy apps and written/verbal language tasks provided today   ? Consulted and Agree with Plan of Care Patient   ? ?  ?  ? ?  ? ? ?Patient will benefit from skilled therapeutic intervention in order to improve the following deficits and impairments:   ?Aphasia ? ? ? ?Problem List ?Patient Active Problem List  ? Diagnosis Date Noted  ? Stroke (cerebrum) ([place]) 09/25/2021  ? 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 Acute respiratory insufficiency, postoperative   ? Cardiac resynchronization therapy defibrillator (CRT-D) in place 09/10/2019  ? 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 Complete heart block ([place]) 09/07/2018  ? Chronic systolic CHF (congestive heart failure) ([place]) 07/07/2018  ? 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 Tinea pedis of both feet 02/12/2018  ? Onychomycosis 02/12/2018  ? Chronic atrial flutter ([place]) 11/01/2016  ? ATRIAL FLUTTER 09/28/2010  ?  BRADYCARDIA, CHRONIC 09/28/2010  ? ? ?[place] [place] ?10/06/2021, 8:10 PM ? ?[facility] ?[address], [address] ?[place], [place], [address] ?Phone: [phone]

## 2021-10-06 NOTE — Patient Instructions (Signed)
? ?  Constant Therapy -app free for 14 days    partner15 - code 15% off monthly subscription ? ?Talk path Therapy - [email or web address] ?

## 2021-10-06 NOTE — Therapy (Signed)
[facility] ?[address], [address] ?[place], [place], [address] ?Phone: [phone]   Fax:  [phone] ? ?Occupational Therapy Treatment ? ?Patient Details  ?Name: Ricardo Jones ?MRN: [id number] ?Date of Birth: 17-Jul-1970 ?Referring Provider (OT): Rosalin Hawking, MD ? ? ?Encounter Date: 10/06/2021 ? ? OT End of Session - 10/06/21 1145   ? ? Visit Number 1   ? Number of Visits 5   ? Date for OT Re-Evaluation 11/03/21   ? Authorization Type [employer] PPO   ? OT Start Time 0930   ? OT Stop Time 1014   ? OT Time Calculation (min) 44 min   ? Activity Tolerance Patient tolerated treatment well   ? ?  ?  ? ?  ? ? ?Past Medical History:  ?Diagnosis Date  ? Bradycardia   ? asymptomatic  ? S/P dilatation of esophageal stricture   ? Typical atrial flutter ([place])   ? ? ?Past Surgical History:  ?Procedure Laterality Date  ? BIV ICD INSERTION CRT-D N/A 09/08/2018  ? Procedure: BIV ICD INSERTION CRT-D;  Surgeon: Evans Lance, MD;  Location: [facility];  Service: Cardiovascular;  Laterality: N/A;  ? IR CT HEAD LTD  09/25/2021  ? IR PERCUTANEOUS ART THROMBECTOMY/INFUSION INTRACRANIAL INC DIAG ANGIO  09/25/2021  ? RADIOLOGY WITH ANESTHESIA N/A 09/25/2021  ? Procedure: IR WITH ANESTHESIA;  Surgeon: Luanne Bras, MD;  Location: [facility];  Service: Radiology;  Laterality: N/A;  ? THROAT SURGERY  2012  ? ? ?There were no vitals filed for this visit. ? ? Subjective Assessment - 10/06/21 0933   ? ? Subjective  Pt presents to OPOT with reports of pain in ulnar side of forearm with various tasks, sometimes it can be quite intense.  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   ? Pertinent History bradycardia, a flutter, CHF, cardiac sarcoidosis   ? Patient Stated Goals to be able to return to work   ? Currently in Pain? No/denies   ? ?  ?  ? ?  ? ? ? ? ? [place] OT Assessment - 10/06/21 0934   ? ?  ?  Assessment  ? Medical Diagnosis bradycardia, a flutter, CHF, cardiac sarcoidosis   ? Referring Provider (OT) Rosalin Hawking, MD   ? Onset Date/Surgical Date 09/25/21   ? Hand Dominance Left   ? Next MD Visit 10/10/21   ? Prior Therapy in acute care   ?  ? Precautions  ? Precautions Fall   ?  ? Restrictions  ? Weight Bearing Restrictions No   ?  ? Balance Screen  ? Has the patient fallen in the past 6 months No   ? Has the patient had a decrease in activity level because of a fear of falling?  No   ? Is the patient reluctant to leave their home because of a fear of falling?  No   ?  ? Home  Environment  ? Family/patient expects to be discharged to: Private residence   ? Living Arrangements Spouse/significant other   ? Type of Home House   ? Home Access Stairs   13  ? Home Layout Two level   ? Alternate Level Stairs - Number of Steps full flight   ? Bathroom Building control surveyor   ? Bathroom Toilet --   comfort height  ? Lives With Spouse   children  ?  ?  Prior Function  ? Level of Independence Independent   ? Vocation Full time employment   ? 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 Leisure going to the gym   ?  ? ADL  ? Eating/Feeding Independent   ? Grooming Independent   ? ADL comments Independent with all bathing, dressing, and bathroom transfers   ?  ? IADL  ? Light Housekeeping Performs light daily tasks such as dishwashing, bed making   ? Community Mobility Drives own vehicle   ? Medication Management Is responsible for taking medication in correct dosages at correct time   ? Financial Management Manages day-to-day purchases, but needs help with banking, major purchases, etc.   ?  ? Written Expression  ? Dominant Hand Left   ? Handwriting 100% legible   pt reports having neat handwriting due to profession as Arboriculturist  ?  ? Vision - History  ? Baseline Vision No visual deficits   ? Additional Comments pt reports occasional "tunnel vision"when in hospital but that has  resolved   ?  ? Vision Assessment  ? Eye Alignment Within Functional Limits   ? Vision Assessment Vision tested   ? Ocular Range of Motion Within Functional Limits   ? Alignment/Gaze Preference Within Defined Limits   ? Tracking/Visual Pursuits Able to track stimulus in all quads without difficulty   ? Saccades Within functional limits   ? Visual Fields No apparent deficits   ? Comment completed confrontation: WNL   ?  ? Sensation  ? Light Touch Appears Intact   Pt reports intermittent pain along ulnar deviation in forearm  ?  ? Coordination  ? Fine Motor Movements are Fluid and Coordinated Yes   ? Finger Nose Finger Test WNL, mildly impulsive   ? 9 Hole Peg Test Right;Left   ? Right 9 Hole Peg Test 26.31   ? Left 9 Hole Peg Test 24.88   noted decreased use of index finger  ? Box and Blocks R: 59 and L: 64   ?  ? ROM / Strength  ? AROM / PROM / Strength AROM;Strength   ?  ? AROM  ? Overall AROM  Within functional limits for tasks performed   ?  ? Strength  ? Overall Strength Deficits   ? Strength Assessment Site Shoulder;Elbow   ? Right/Left Shoulder Right;Left   ? Right Shoulder Flexion 4+/5   ? Left Shoulder Flexion 4/5   ? Right/Left Elbow Right;Left   ? Right Elbow Flexion 4+/5   ? Right Elbow Extension 4+/5   ? Left Elbow Flexion 4/5   ? Left Elbow Extension 4-/5   ?  ? Hand Function  ? Right Hand Grip (lbs) 72   ? Right Hand Lateral Pinch 22 lbs   ? Right Hand 3 Point Pinch 22 lbs   ? Left Hand Grip (lbs) 83   ? Left Hand Lateral Pinch 21 lbs   ? Left 3 point pinch 22 lbs   ? ?  ?  ? ?  ? ? ? ? ? ? ? ? ? ? ? ? ? ? ? ? ? ? ? OT Education - 10/06/21 1144   ? ? Education Details Educated on role and purpose of OT as well as potential interventions and goals for therapy based on initial evaluation findings.   ? Person(s) Educated Patient   ? Methods Explanation   ? Comprehension Verbalized understanding   ? ?  ?  ? ?  ? ? ? ? ? ?  OT Long Term Goals - 10/06/21 1154   ? ?  ? OT LONG TERM GOAL #1  ? Title Pt will  verbalize understanding of adapted strategies to maximize safety and Independence with work tasks   ? Time 4   ? Period Weeks   ? Status New   ? Target Date 11/03/21   ?  ? OT LONG TERM GOAL #2  ? Title Pt will demonstrate improved fine motor coordination for work tasks as evidenced by completing 9 hole peg test in 24 seconds while incorporating use of index finger into activity.   ? Baseline 24.88, however not utilizing index finger in task   ? Time 4   ? Period Weeks   ? Status New   ?  ? OT LONG TERM GOAL #3  ? Title Pt will report increased ease with cutting fabric with scissors as needed for work tasks.   ? Time 4   ? Period Weeks   ? Status New   ?  ? OT LONG TERM GOAL #4  ? Title Pt will report increased ability to tie knot when sewing as needed for work tasks.   ? Time 4   ? Period Weeks   ? Status New   ?  ? OT LONG TERM GOAL #5  ? Title Pt will verbalize understanding of need for brain breaks and utilization of breaks due to visual and mental fatigue during IADLs and work related tasks.   ? Time 4   ? Period Weeks   ? Status New   ? ?  ?  ? ?  ? ? ? ? ? ? ? ? Plan - 10/06/21 1146   ? ? 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 PMHx includes bradycardia, a flutter, CHF, cardiac sarcoidosis. Pt will benefit from skilled occupational therapy services to address strength and coordination, ROM, pain management,GM/FM control, cognition, safety awareness, introduction of compensatory strategies/AE prn, visual-perception, and implementation of an HEP to improve participation and safety during IADLs and work tasks.   ? OT Occupational Profile and History Detailed Assessment- Review of  Records and additional review of physical, cognitive, psychosocial history related to current functional performance   ? Occupational performance deficits (Please refer to evaluation for details): IADL's;Work   ? Body S

## 2021-10-09 ENCOUNTER — Ambulatory Visit: Payer: BC Managed Care – PPO

## 2021-10-09 ENCOUNTER — Encounter: Payer: Self-pay | Admitting: Occupational Therapy

## 2021-10-09 ENCOUNTER — Other Ambulatory Visit: Payer: Self-pay

## 2021-10-09 ENCOUNTER — Ambulatory Visit: Payer: BC Managed Care – PPO | Admitting: Occupational Therapy

## 2021-10-09 DIAGNOSIS — R278 Other lack of coordination: Secondary | ICD-10-CM

## 2021-10-09 DIAGNOSIS — M6281 Muscle weakness (generalized): Secondary | ICD-10-CM

## 2021-10-09 DIAGNOSIS — R4701 Aphasia: Secondary | ICD-10-CM

## 2021-10-09 NOTE — Therapy (Addendum)
[facility] [address] [place], [place], [address] Phone: [phone]   Fax:  [phone]  Occupational Therapy Treatment  Patient Details  Name: Ricardo Jones MRN: [id number] Date of Birth: Mar 14, 1971 Referring Provider (OT): Rosalin Hawking, MD   Encounter Date: 10/09/2021   OT End of Session - 10/09/21 0932     Visit Number 2    Number of Visits 5    Date for OT Re-Evaluation 11/03/21    Authorization Type [employer]    OT Start Time [phone]    OT Stop Time 0950    OT Time Calculation (min) 19 min    Activity Tolerance Patient tolerated treatment well             Past Medical History:  Diagnosis Date   Bradycardia    asymptomatic   S/P dilatation of esophageal stricture    Typical atrial flutter [facility])     Past Surgical History:  Procedure Laterality Date   BIV ICD INSERTION CRT-D N/A 09/08/2018   Procedure: BIV ICD INSERTION CRT-D;  Surgeon: Evans Lance, MD;  Location: [place];  Service: Cardiovascular;  Laterality: N/A;   IR CT HEAD LTD  09/25/2021   IR PERCUTANEOUS ART THROMBECTOMY/INFUSION INTRACRANIAL INC DIAG ANGIO  09/25/2021   RADIOLOGY WITH ANESTHESIA N/A 09/25/2021   Procedure: IR WITH ANESTHESIA;  Surgeon: Luanne Bras, MD;  Location: [facility];  Service: Radiology;  Laterality: N/A;   THROAT SURGERY  2012    There were no vitals filed for this visit.   Subjective Assessment - 10/09/21 0933     Subjective  Pt reports plans to return to work on Thursday.  States he did some sewing activities over the weekend that went well.    Pertinent History bradycardia, a flutter, CHF, cardiac sarcoidosis    Patient Stated Goals to be able to return to work    Currently in Pain? No/denies                          OT Treatments/Exercises (OP) - 10/09/21 0001       ADLs   Writing Pt demonstrating improved handwriting this session.  Pt reports improved legibility and  reporting hand and mental fatigue with prolonged writing.  Pt reports that he is taking breaks as needed with handwriting and work tasks as he works on [employer] so he is able to take small jobs and build up as he improves.    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  Pt with increased frustration when attempting to thread needle, stating the needle is smaller than the ones he typically uses.  Pt performed a few simple stitches and able to tie off knot with good success.  Pt reports completing a sewing task over the weekend with improved abilities.  Pt demonstrating increased incorporation of index finger into [facility] tasks.                         OT Long Term Goals - 10/09/21 1126       OT LONG TERM GOAL #1   Title Pt will verbalize understanding of adapted strategies to maximize safety and Independence with work tasks    Time 4    Period Weeks    Status On-going    Target Date 11/03/21      OT LONG TERM GOAL #2   Title  Pt will demonstrate improved fine motor coordination for work tasks as evidenced by completing 9 hole peg test in 24 seconds while incorporating use of index finger into activity.    Baseline 24.88, however not utilizing index finger in task    Time 4    Period Weeks    Status On-going      OT LONG TERM GOAL #3   Title Pt will report increased ease with cutting fabric with scissors as needed for work tasks.    Time 4    Period Weeks    Status Achieved      OT LONG TERM GOAL #4   Title Pt will report increased ability to tie knot when sewing as needed for work tasks.    Time 4    Period Weeks    Status Achieved      OT LONG TERM GOAL #5   Title Pt will verbalize understanding of need for brain breaks and utilization of breaks due to visual and mental fatigue during IADLs and work related tasks.    Time 4    Period Weeks    Status On-going                   Plan - 10/09/21 0932     Clinical Impression Statement Pt was able to  engage in simple stitching and cutting of thread with mild increase in frustration due to small needle hole and stating he uses a diffierent size needle and stitching technique.  Pt reports completing a sewing task over the weekend with improved gross and fine motor coordination and no issues with tying knots.  Pt reports improved awareness of incorporating index finger into tasks for increased coordination and handwriting.  Pt reports taking rest breaks and understanding of the importance of taking brain breaks as he eases back into work tasks.  Pt reports no further OT concerns and expressing desire to d/c from OT services.    OT Occupational Profile and History Detailed Assessment- Review of Records and additional review of physical, cognitive, psychosocial history related to current functional performance    Occupational performance deficits (Please refer to evaluation for details): IADL's;Work    Body Structure / Function / Physical Skills ADL;Balance;Body mechanics;Cardiopulmonary status limiting activity;Coordination;Dexterity;Endurance;FMC;GMC;IADL;Mobility;Pain;Proprioception;ROM;Sensation;Strength;UE functional use;Vision    Cognitive Skills Safety Awareness    Rehab Potential Good    Clinical Decision Making Several treatment options, min-mod task modification necessary    Comorbidities Affecting Occupational Performance: May have comorbidities impacting occupational performance    Modification or Assistance to Complete Evaluation  No modification of tasks or assist necessary to complete eval    OT Frequency 1x / week    OT Duration 4 weeks    OT Treatment/Interventions Self-care/ADL training;Ultrasound;Moist Heat;Therapeutic exercise;Neuromuscular education;Energy conservation;DME and/or AE instruction;Functional Mobility Training;Manual Therapy;Compression bandaging;Passive range of motion;Therapeutic activities;Cognitive remediation/compensation;Visual/perceptual  remediation/compensation;Patient/family education;Balance training;Psychosocial skills training    Plan Possible d/c.  Plan to hold open for remainder of cert and then d/c if no pt needs arise.    Consulted and Agree with Plan of Care Patient             Patient will benefit from skilled therapeutic intervention in order to improve the following deficits and impairments:   Body Structure / Function / Physical Skills: ADL, Balance, Body mechanics, Cardiopulmonary status limiting activity, Coordination, Dexterity, Endurance, FMC, GMC, IADL, Mobility, Pain, Proprioception, ROM, Sensation, Strength, UE functional use, Vision Cognitive Skills: Safety Awareness     Visit Diagnosis: Muscle weakness (  generalized)  Other lack of coordination    Problem List Patient Active Problem List   Diagnosis Date Noted   Stroke (cerebrum) ([place]) 09/25/2021   Middle cerebral artery embolism, 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   Acute respiratory insufficiency, postoperative    Cardiac resynchronization therapy defibrillator (CRT-D) in place 09/10/2019   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 [place], [place], [address] Phone: [phone]   Fax:  [phone]  Name: Ricardo Jones MRN: [id number] Date of Birth: [birth year]   OCCUPATIONAL THERAPY DISCHARGE SUMMARY  Visits from Start of Care: 2  Current functional level related to goals / functional outcomes: Pt has not returned since last visit, upon which he had expressed desire to hold off on OT and return only if needed.   During last visit pt reported completing a sewing task with improved gross and fine motor coordination and no issues with tying knots.  Pt reports improved awareness of incorporating index finger into tasks for increased coordination and handwriting.     Remaining deficits: Pt still with some difficulty with handwriting and incorporation of index finger into [facility] tasks at last visit.   Education / Equipment: Educated on [facility] tasks and incorporating index finger into tasks to increase coordination.   Patient agrees to discharge. Patient goals were  not assessed due to pt not returning for further treatment . Patient is being discharged due to not returning since the last visit.Marland Kitchen   Simonne Come OTR/L 12/19/2021

## 2021-10-09 NOTE — Therapy (Signed)
[facility] ?[address], [address] ?[place], [place], [address] ?Phone: [phone]   Fax:  [phone] ? ?Speech Language Pathology Treatment ? ?Patient Details  ?Name: Ricardo Jones ?MRN: [id number] ?Date of Birth: [birth year] ?Referring Provider (SLP): Ricardo Hawking, MD ? ? ?Encounter Date: 10/09/2021 ? ? End of Session - 10/09/21 1340   ? ? Visit Number 2   ? Number of Visits 25   ? Date for SLP Re-Evaluation 12/29/21   ? SLP Start Time 1000   ? SLP Stop Time  1045   ? SLP Time Calculation (min) 45 min   ? Activity Tolerance Patient tolerated treatment well   ? ?  ?  ? ?  ? ? ?Past Medical History:  ?Diagnosis Date  ? Bradycardia   ? asymptomatic  ? S/P dilatation of esophageal stricture   ? Typical atrial flutter ([place])   ? ? ?Past Surgical History:  ?Procedure Laterality Date  ? BIV ICD INSERTION CRT-D N/A 09/08/2018  ? Procedure: BIV ICD INSERTION CRT-D;  Surgeon: Ricardo Lance, MD;  Location: [facility];  Service: Cardiovascular;  Laterality: N/A;  ? IR CT HEAD LTD  09/25/2021  ? IR PERCUTANEOUS ART THROMBECTOMY/INFUSION INTRACRANIAL INC DIAG ANGIO  09/25/2021  ? RADIOLOGY WITH ANESTHESIA N/A 09/25/2021  ? Procedure: IR WITH ANESTHESIA;  Surgeon: Ricardo Bras, MD;  Location: [facility];  Service: Radiology;  Laterality: N/A;  ? THROAT SURGERY  2012  ? ? ?There were no vitals filed for this visit. ? ? Subjective Assessment - 10/09/21 1329   ? ? Subjective Pt's OT reported Ricardo Jones does not think he needs to cont to see OT.   ? Currently in Pain? No/denies   ? ?  ?  ? ?  ? ? ? ? ? ? ? ? ADULT SLP TREATMENT - 10/09/21 1330   ? ?  ? General Information  ? Behavior/Cognition Alert;Pleasant mood;Cooperative   ?  ? Cognitive-Linquistic Treatment  ? Treatment focused on Aphasia   ? Skilled Treatment Ricardo Jones has been performing tasks provided to him Friday. Sentence compeltion he feels was easy. Pt cont to tell SLP synonyms are challenging to produce. SLP worked with pt today on  synonyms from a stimulus word. Pt with 81% success with synonyms, and mod-max A consistently necessary for incr'd success. Spelling errors occurred occasionally, with SLP cues consistently necessary. SLP told pt do write a mis-spelled word 3-5 times reciting each letter and then the word after each rep. With synonyms pt had difficulty generating SLP told pt to write three sentences with the synonym. Pt spelled mis-spelled words after the three reps, later, with 100% success.   ?  ? Assessment / Recommendations / Plan  ? Plan Continue with current plan of care   ?  ? Progression Toward Goals  ? Progression toward goals Progressing toward goals   ? ?  ?  ? ?  ? ? ? ? ? SLP Short Term Goals - 10/09/21 1341   ? ?  ? SLP SHORT TERM GOAL #1  ? Title pt will write senence level information with 80% success and rare min A in two sessions   ? Time 4   ? Period Weeks   ? Status On-going   ? Target Date 11/03/21   ?  ? SLP SHORT TERM GOAL #2  ? Title pt will give sentence level descriptions with 90% success and modified independence in 2 sessions   ? Time  4   ? Period Weeks   ? Status On-going   ? Target Date 11/03/21   ? ?  ?  ? ?  ? ? ? SLP Long Term Goals - 10/09/21 1341   ? ?  ? SLP LONG TERM GOAL #1  ? Title pt will write 3-4 sentence paragraphs with 90% success with nonverbal cues   ? Time 8   ? Period Weeks   ? Status On-going   ? Target Date 12/01/21   ?  ? SLP LONG TERM GOAL #2  ? Title pt will write 6-7 sentence paragraphs with 90% success and self correction in 3 sessions   ? Time 12   ? Period Weeks   ? Status On-going   ? Target Date 12/29/21   ?  ? SLP LONG TERM GOAL #3  ? Title pt will demo functional mod complex conversation with modified independence in 3 sessions   ? Time 8   ? Period Weeks   ? Status On-going   ? Target Date 12/01/21   ?  ? SLP LONG TERM GOAL #4  ? Title pt will engage in 15 minutes mod complex-complex conversation with modified independence in 3 sessions   ? Time 12   ? Period Weeks   ?  Status On-going   ? Target Date 12/29/21   ? ?  ?  ? ?  ? ? ? Plan - 10/09/21 1340   ? ? Clinical Impression Statement Pt presents today with mod verbal expression deficit, and mod-severe written language deficit. Pt would cont to benefit from skilled ST targeting language expression (verbal and written).   ? Speech Therapy Frequency 2x / week   ? Duration 12 weeks   ? Treatment/Interventions Functional tasks;Patient/family education;Compensatory strategies;Multimodal communcation approach;Environmental controls;SLP instruction and feedback;Language facilitation   ? Potential to Achieve Goals Good   ? SLP Home Exercise Plan therapy apps and written/verbal language tasks provided today   ? Consulted and Agree with Plan of Care Patient   ? ?  ?  ? ?  ? ? ?Patient will benefit from skilled therapeutic intervention in order to improve the following deficits and impairments:   ?Aphasia ? ? ? ?Problem List ?Patient Active Problem List  ? Diagnosis Date Noted  ? Stroke (cerebrum) ([place]) 09/25/2021  ? 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 Acute respiratory insufficiency, postoperative   ? Cardiac resynchronization therapy defibrillator (CRT-D) in place 09/10/2019  ? 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 Complete heart block ([place]) 09/07/2018  ? Chronic systolic CHF (congestive heart failure) ([place]) 07/07/2018  ? 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 Tinea pedis of both feet 02/12/2018  ? Onychomycosis 02/12/2018  ? Chronic atrial flutter ([place]) 11/01/2016  ? ATRIAL FLUTTER 09/28/2010  ? BRADYCARDIA, CHRONIC 09/28/2010  ? ? ?[place], [place] ?10/09/2021, 1:42 PM ? ?[facility] ?[facility] ?[address], [address] ?[place], [place], [address] ?Phone: [phone]   Fax:  [phone] ? ? ?Name: Ricardo Jones ?MRN: [id number] ?Date of Birth: 1971/05/27 ? ?

## 2021-10-10 ENCOUNTER — Other Ambulatory Visit (HOSPITAL_COMMUNITY): Payer: Self-pay

## 2021-10-10 ENCOUNTER — Telehealth (HOSPITAL_COMMUNITY): Payer: Self-pay | Admitting: *Deleted

## 2021-10-10 ENCOUNTER — Telehealth (HOSPITAL_COMMUNITY): Payer: Self-pay

## 2021-10-10 ENCOUNTER — Encounter (HOSPITAL_COMMUNITY): Payer: Self-pay

## 2021-10-10 ENCOUNTER — Ambulatory Visit (HOSPITAL_COMMUNITY)
Admit: 2021-10-10 | Discharge: 2021-10-10 | Disposition: A | Discharge status: Home / Self Care | Payer: BC Managed Care – PPO | Attending: Cardiology | Admitting: Cardiology

## 2021-10-10 ENCOUNTER — Other Ambulatory Visit (HOSPITAL_COMMUNITY): Payer: Self-pay | Admitting: Cardiology

## 2021-10-10 VITALS — BP 122/82 | HR 80 | Ht 79.0 in | Wt 251.4 lb

## 2021-10-10 DIAGNOSIS — Z7901 Long term (current) use of anticoagulants: Secondary | ICD-10-CM | POA: Diagnosis not present

## 2021-10-10 DIAGNOSIS — R531 Weakness: Secondary | ICD-10-CM | POA: Insufficient documentation

## 2021-10-10 DIAGNOSIS — Z8673 Personal history of transient ischemic attack (TIA), and cerebral infarction without residual deficits: Secondary | ICD-10-CM | POA: Diagnosis not present

## 2021-10-10 DIAGNOSIS — E785 Hyperlipidemia, unspecified: Secondary | ICD-10-CM

## 2021-10-10 DIAGNOSIS — Z8249 Family history of ischemic heart disease and other diseases of the circulatory system: Secondary | ICD-10-CM | POA: Insufficient documentation

## 2021-10-10 DIAGNOSIS — I5022 Chronic systolic (congestive) heart failure: Secondary | ICD-10-CM | POA: Insufficient documentation

## 2021-10-10 DIAGNOSIS — I110 Hypertensive heart disease with heart failure: Secondary | ICD-10-CM | POA: Insufficient documentation

## 2021-10-10 DIAGNOSIS — I442 Atrioventricular block, complete: Secondary | ICD-10-CM | POA: Insufficient documentation

## 2021-10-10 DIAGNOSIS — I4892 Unspecified atrial flutter: Secondary | ICD-10-CM | POA: Insufficient documentation

## 2021-10-10 DIAGNOSIS — Z79899 Other long term (current) drug therapy: Secondary | ICD-10-CM | POA: Diagnosis not present

## 2021-10-10 DIAGNOSIS — I472 Ventricular tachycardia, unspecified: Secondary | ICD-10-CM | POA: Diagnosis not present

## 2021-10-10 DIAGNOSIS — Z95810 Presence of automatic (implantable) cardiac defibrillator: Secondary | ICD-10-CM | POA: Diagnosis not present

## 2021-10-10 DIAGNOSIS — I428 Other cardiomyopathies: Secondary | ICD-10-CM | POA: Diagnosis not present

## 2021-10-10 DIAGNOSIS — I429 Cardiomyopathy, unspecified: Secondary | ICD-10-CM | POA: Diagnosis not present

## 2021-10-10 HISTORY — DX: Atrioventricular block, complete: I44.2

## 2021-10-10 HISTORY — DX: Hyperlipidemia, unspecified: E78.5

## 2021-10-10 HISTORY — DX: Chronic systolic (congestive) heart failure: I50.22

## 2021-10-10 LAB — BASIC METABOLIC PANEL
Anion gap: 6 (ref 5–15)
BUN: 17 mg/dL (ref 6–20)
CO2: 28 mmol/L (ref 22–32)
Calcium: 9.5 mg/dL (ref 8.9–10.3)
Chloride: 104 mmol/L (ref 98–111)
Creatinine, Ser: 0.99 mg/dL (ref 0.61–1.24)
GFR, Estimated: >60 mL/min (ref >60)
Glucose, Bld: 93 mg/dL (ref 70–99)
Potassium: 4.5 mmol/L (ref 3.5–5.1)
Sodium: 138 mmol/L (ref 135–145)

## 2021-10-10 MED ORDER — ENTRESTO 24-26 MG PO TABS: 1.0000 | ORAL_TABLET | Freq: Two times a day (BID) | ORAL | 3 refills | Status: DC

## 2021-10-10 NOTE — Telephone Encounter (Signed)
Heart Failure Patient Advocate Encounter ?  ?Received notification from [employer] [place] that prior authorization for Delene Loll is required. ?  ?PA submitted on CoverMyMeds ?Key [id number] ?Status is pending ?  ?Will continue to follow. ? ?

## 2021-10-10 NOTE — Progress Notes (Signed)
Blood collected for Cardiomyopathy genetic testing through [facility] per Dr Aundra Dubin.  Order form completed and both shipped by [employer] to [facility]. ? ?Kevan Rosebush, RN, BSN, CHFN ?Specialty Coordinator ?Advanced Heart Failure Clinic ? ?

## 2021-10-10 NOTE — Progress Notes (Signed)
? ? ?HEART & VASCULAR TRANSITION OF CARE CONSULT NOTE  ? ? ? ?Referring Physician: Dr. Burt Knack ?Primary Care: Patient, No Pcp Per (Inactive) ?Primary Cardiologist: Dr. Caryl Comes Dr. Acie Fredrickson  ? ?HPI: ?Referred to clinic by Dr. Burt Knack for heart failure consultation.  ? ?51 y/o male w/ chronic systolic heart failure, CHB and h/o VT s/p CRT-D, atrial flutter and recent CVA.  ? ?Has family h/o of HF/ genetic CM w/ multiple maternal relatives that have required cardiac transplants. His mother has had genetic testing and LMNA gene +. Patient himself has previously declined gene testing.  ? ?He was previously followed by Dr. Rayann Heman and Dr. Acie Fredrickson for atrial flutter and bradycardia. Declined AFL ablation. Later developed CHB and growing concern for possible sarcoid. Subsequently was referred for cMRI and PET scan in 2019. cMRI showed non coronary pattern LGE suspicious for cardiac sarcoidosis. PET scan at [facility] showed abnormal FDG uptake consistent with inflamatory process of the myocardium, more especificaly involving the basal-inferior, Basal infero septal and mid infero-septal walls. This was also concerning for possible sarcoid, however he had chest CT that showed no pulmonary evidence for sarcoid. Given no definitive diagnosis, pt declined empiric immunosuppressive treatment, but did undergo CRT-D placement by Dr. Lovena Le. He has since been followed primary by Dr. Caryl Comes.  ? ?Echo 01/2014 EF 50-55%, RV normal  ?Echo 11/2016 EF 40-45%, RV normal  ?Echo 04/2018 EF 40-45%, RV normal  ?CRT-D Placed 08/2018  ?Echo 02/2019 EF 50-55%, RV normal  ?Echo 07/2021 showed mildly reduced LVEF, 45-50%, normal RV. Moderate BAE.  ? 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 Unfortunately had not been on a/c prior to this due to CHADSVASc score of 1. He underwent successful mechanical thrombectomy by IR with TICI 3 flow achieved. Patient 's symptoms  improved rapidly.  Cardiology was consulted as TTE showed EF of 30-35%.  He was placed on limited medical therapy to allow for permissive hypertension following stroke. Started on Coreg 3.15 mg bid. Eliquis was started for atrial flutter. Atorvastatin added (LDL 100 mg/dL). Hgb A1c 6.0. Baseline SCr < 1.0. Discharged home on 3/16. Referred to [facility] clinic.  ? ?Presents today for assessment. Here w/ wife. Continues w/ word finding difficulty + mild LUE weakness post stroke.  ? ?From HF standpoint, no significant limitations w/ ALDs. NYHA Class I-II. Slight fatigue w/ Coreg but tolerating ok. BP well controlled, 122/82. Device interrogation shows slight fluid accumulation however Optivol fluid index remains < threshold. Brief paroxysmal atrial flutter. 99% BiV paced. No VT/VF. Activity level 3.8 h/day.  Repots full compliance w/ Eliquis. Denies CP.  ? ? ?Cardiac Testing  ? ?2D Echo 09/2021 ?No left ventricular thrombus is seen (Definity was not administered). There is marked ?septal-lateral dyssynchrony. Left ventricular ejection fraction, by estimation, is 30 to ?35%. The left ventricle has moderately decreased function. The left ventricle ?demonstrates global hypokinesis. The left ventricular internal cavity size was ?moderately dilated. Left ventricular diastolic function could not be evaluated. ?1. ?Right ventricular systolic function is normal. The right ventricular size is mildly ?enlarged. Device lead is visualized in the right ventricle and in the coronary sinus. ?Tricuspid regurgitation signal is inadequate for assessing PA pressure. ?2. ?3. Left atrial size was severely dilated. ?4. Right atrial size was severely dilated. ?5. The mitral valve is normal in structure. Trivial mitral valve regurgitation. ?The aortic valve is tricuspid. Aortic valve regurgitation is not visualized. No aortic ?stenosis is  present. ?6. ?The inferior vena cava is dilated in size with <50% respiratory variability, suggesting ?right  atrial pressure of 15 mmHg. ? ? ? ?cMRI 2019  ? ?IMPRESSION: ?1. Moderately dilated left ventricle with normal wall thickness and ?mildly to moderately decreased systolic function (LVEF = 44%). There ?is diffuse hypokinesis. There is patchy midwall late gadolinium ?enhancement in the basal anteroseptal, inferoseptal, inferolateral, ?anterolateral and apical lateral walls. ?  ?2. Mildly dilated right ventricle with normal thickness and systolic ?function (LVEF = 62%). There are no regional wall motion ?abnormalities. ?  ?3. Severe bi-atrial dilatation. ?  ?4. Mild pulmonary artery dilatation measuring 33 mm suggestive of ?pulmonary hypertension. ?  ?5. Trivial pericardial effusion. ?  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 FDG PET is recommended for further evaluation. ?  ?I would also recommend a cardiac CTA for evaluation of a possible ?ASD (vs aneurysmal interatrial septum). ? ?PET Scan ([facility]) 2019  ? ?There is a small area in the basal segment of the inferior with mildly decreased perfusion. On the metabolic study there is abnormal FDG uptake consistent with inflamatory process of the myocardium. More especificaly involving the basal-inferior, Basal infero septal and mid infero-septal walls. In the porper clinical settings this may represent active sarcoidosis in the myocardium.   ? ? ?Review of Systems: [y] = yes, '[ ]'$  = no  ? ?General: Weight gain '[ ]'$ ; Weight loss '[ ]'$ ; Anorexia '[ ]'$ ; Fatigue [Y ]; Fever '[ ]'$ ; Chills '[ ]'$ ; Weakness '[ ]'$   ?Cardiac: Chest pain/pressure '[ ]'$ ; Resting SOB '[ ]'$ ; Exertional SOB '[ ]'$ ; Orthopnea '[ ]'$ ; Pedal Edema '[ ]'$ ; Palpitations '[ ]'$ ; Syncope '[ ]'$ ; Presyncope '[ ]'$ ; Paroxysmal nocturnal dyspnea'[ ]'$   ?Pulmonary: Cough '[ ]'$ ; Wheezing'[ ]'$ ; Hemoptysis'[ ]'$ ; Sputum '[ ]'$ ; Snoring '[ ]'$   ?GI: Vomiting'[ ]'$ ; Dysphagia'[ ]'$ ; Melena'[ ]'$ ; Hematochezia '[ ]'$ ; Heartburn'[ ]'$ ; Abdominal pain '[ ]'$ ; Constipation '[ ]'$ ; Diarrhea [  ]; BRBPR '[ ]'$   ?GU: Hematuria'[ ]'$ ; Dysuria '[ ]'$ ; Nocturia'[ ]'$   ?Vascular: Pain in legs with walking '[ ]'$ ; Pain in feet with lying flat '[ ]'$ ; Non-healing sores '[ ]'$ ; Stroke '[ ]'$ ; TIA '[ ]'$ ; Slurred speech '[ ]'$ ;  ?Neuro: Headaches'[ ]'$ ; Vertigo'[ ]'$ ; Seizures'[ ]'$ ; Paresthesias'[ ]'$ ;Blurred vision '[ ]'$ ; Diplopia '[ ]'$ ; Vision changes '[ ]'$   ?Ortho/Skin: Arthritis '[ ]'$ ; Joint pain '[ ]'$ ; Muscle pain '[ ]'$ ; Joint swelling '[ ]'$ ; Back Pain '[ ]'$ ; Rash '[ ]'$   ?Psych: Depression'[ ]'$ ; Anxiety'[ ]'$   ?Heme: Bleeding problems '[ ]'$ ; Clotting disorders '[ ]'$ ; Anemia '[ ]'$   ?Endocrine: Diabetes '[ ]'$ ; Thyroid dysfunction'[ ]'$  ? ? ?Past Medical History:  ?Diagnosis Date  ? Acute right MCA stroke ([place]) 09/25/2021  ? s/p thrombectomy  ? Bradycardia   ? asymptomatic  ? Chronic systolic CHF (congestive heart failure), NYHA class 2 ([place])   ? Complete heart block ([facility])   ? s/p CRT-D  ? Hyperlipidemia LDL goal <100   ? S/P dilatation of esophageal stricture   ? Typical atrial flutter ([name])   ? ? ?Current Outpatient Medications  ?Medication Sig Dispense Refill  ? apixaban (ELIQUIS) 5 MG TABS tablet Take 1 tablet (5 mg total) by mouth 2 (two) times daily. 60 tablet 1  ? atorvastatin (LIPITOR) 40 MG tablet Take 1 tablet (40 mg total) by mouth daily. 30 tablet 1  ? carvedilol (COREG) 3.125 MG tablet Take 1 tablet (3.125 mg total) by mouth  2 (two) times daily with a meal. 60 tablet 5  ? sacubitril-valsartan (ENTRESTO) 24-26 MG Take 1 tablet by mouth 2 (two) times daily. 60 tablet 3  ? ?No current facility-administered medications for this encounter.  ? ? ?No Known Allergies ? ?  ?Social History  ? ?Socioeconomic History  ? Marital status: Single  ?  Spouse name: Not on file  ? Number of children: Not on file  ? Years of education: Not on file  ? Highest education level: Not on file  ?Occupational History  ? Not on file  ?Tobacco Use  ? Smoking status: Never  ? Smokeless tobacco: Never  ?Vaping Use  ? Vaping Use: Never used  ?Substance and Sexual Activity  ? Alcohol use: Yes  ?  Comment:  occassionally  ? Drug use: No  ? Sexual activity: Not on file  ?Other Topics Concern  ? Not on file  ?Social History Narrative  ? 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 ?So

## 2021-10-10 NOTE — Telephone Encounter (Signed)
Heart Failure Nurse Navigator Progress Note  ? ?Called only number listed for [facility] reminder call, phone was answered, however kept hanging up. Tried 3 times with same result. Patient scheduled for 10/10/21 @ 12 noon. ? ?Earnestine Leys, BSN, RN ?Heart Failure Nurse Navigator ?[phone] ? ?

## 2021-10-10 NOTE — Patient Instructions (Signed)
Start Entresto 24/26 mg Twice daily  ? ?Labs done today, your results will be available in MyChart, we will contact you for abnormal readings. ? ?Genetic test has been done, this has to be sent to [place] to be processed and can take 1-2 weeks to get results back.  We will let you know the results. ? ?Your physician recommends that you return for lab work in: 10 days ? ?Thank you for allowing [place] to provider your heart failure care after your recent hospitalization. Please follow-up with Dr Aundra Dubin in our [facility] in 3 weeks ? ?Do the following things EVERYDAY: ?Weigh yourself in the morning before breakfast. Write it down and keep it in a log. ?Take your medicines as prescribed ?Eat low salt foods--Limit salt (sodium) to 2000 mg per day.  ?Stay as active as you can everyday ?Limit all fluids for the day to less than 2 liters ? ?If you have any questions, issues, or concerns before your next appointment please call our office at [phone], opt. 2 and leave a message for the triage nurse. ? ?

## 2021-10-11 ENCOUNTER — Encounter: Payer: BC Managed Care – PPO | Admitting: Occupational Therapy

## 2021-10-11 ENCOUNTER — Ambulatory Visit: Payer: BC Managed Care – PPO

## 2021-10-11 ENCOUNTER — Telehealth (HOSPITAL_COMMUNITY): Payer: Self-pay

## 2021-10-11 ENCOUNTER — Other Ambulatory Visit: Payer: Self-pay

## 2021-10-11 DIAGNOSIS — M6281 Muscle weakness (generalized): Secondary | ICD-10-CM | POA: Diagnosis not present

## 2021-10-11 DIAGNOSIS — R4701 Aphasia: Secondary | ICD-10-CM

## 2021-10-11 NOTE — Progress Notes (Signed)
Entresto approved through 10/11/22 ?

## 2021-10-11 NOTE — Telephone Encounter (Signed)
Called to schedule 2 wk f/u, no answer, left vm. AW 

## 2021-10-11 NOTE — Therapy (Signed)
[facility] ?[address], [address] ?[place], [place], [address] ?Phone: [phone]   Fax:  [phone] ? ?Speech Language Pathology Treatment ? ?Patient Details  ?Name: Ricardo Jones ?MRN: [id number] ?Date of Birth: 1970-10-11 ?Referring Provider (SLP): Ricardo Hawking, MD ? ? ?Encounter Date: 10/11/2021 ? ? End of Session - 10/11/21 1404   ? ? Visit Number 3   ? Number of Visits 25   ? Date for SLP Re-Evaluation 12/29/21   ? SLP Start Time [phone]   ? SLP Stop Time  1002   ? SLP Time Calculation (min) 29 min   ? Activity Tolerance Patient tolerated treatment well   ? ?  ?  ? ?  ? ? ?Past Medical History:  ?Diagnosis Date  ? Acute right MCA stroke ([place]) 09/25/2021  ? s/p thrombectomy  ? Bradycardia   ? asymptomatic  ? Chronic systolic CHF (congestive heart failure), NYHA class 2 ([place])   ? Complete heart block (H[facility])   ? s/p CRT-D  ? Hyperlipidemia LDL goal <100   ? S/P dilatation of esophageal stricture   ? Typical atrial flutter ([place])   ? ? ?Past Surgical History:  ?Procedure Laterality Date  ? BIV ICD INSERTION CRT-D N/A 09/08/2018  ? Procedure: BIV ICD INSERTION CRT-D;  Surgeon: Evans Lance, MD;  Location: [facility];  Service: Cardiovascular;  Laterality: N/A;  ? IR CT HEAD LTD  09/25/2021  ? IR PERCUTANEOUS ART THROMBECTOMY/INFUSION INTRACRANIAL INC DIAG ANGIO  09/25/2021  ? RADIOLOGY WITH ANESTHESIA N/A 09/25/2021  ? Procedure: IR WITH ANESTHESIA;  Surgeon: Luanne Bras, MD;  Location: [facility];  Service: Radiology;  Laterality: N/A;  ? THROAT SURGERY  2012  ? ? ?There were no vitals filed for this visit. ? ? ? ? ? ? ? ? ? ADULT SLP TREATMENT - 10/11/21 1359   ? ?  ? Treatment Provided  ? Treatment provided Cognitive-Linquistic   ?  ? Cognitive-Linquistic Treatment  ? Treatment focused on Aphasia   ? Skilled Treatment SLP engaged pt in conversation and educated Ricardo Jones about compensatory strategies of circumlocution, description, and synonym. SLP observed pt using  description and circumlocution in session and pointed these out to pt as SLP saw pt using them. SLP also educated pt about speech-to-text function on email and on his phone for texting. SLP strongly encrouaged pt to use these functions in the interim until his langauge skills return to baseline. Pt is returning to work tomorrow and will use these strategies. He is to send SLP 2-4 emails to practice. SLP sugested x1/week and pt and SLP will discuss next session.   ?  ? Assessment / Recommendations / Plan  ? Plan Continue with current plan of care   ?  ? Progression Toward Goals  ? Progression toward goals Progressing toward goals   ? ?  ?  ? ?  ? ? ? ? ? SLP Short Term Goals - 10/11/21 1404   ? ?  ? SLP SHORT TERM GOAL #1  ? Title pt will write senence level information with 80% success and rare min A in two sessions   ? Time 4   ? Period Weeks   ? Status On-going   ? Target Date 11/03/21   ?  ? SLP SHORT TERM GOAL #2  ? Title pt will give sentence level descriptions with 90% success and modified independence in 2 sessions   ? Time 4   ? Period  Weeks   ? Status On-going   ? Target Date 11/03/21   ? ?  ?  ? ?  ? ? ? SLP Long Term Goals - 10/11/21 1405   ? ?  ? SLP LONG TERM GOAL #1  ? Title pt will write 3-4 sentence paragraphs with 90% success with nonverbal cues   ? Time 8   ? Period Weeks   ? Status On-going   ?  ? SLP LONG TERM GOAL #2  ? Title pt will write 6-7 sentence paragraphs with 90% success and self correction in 3 sessions   ? Time 12   ? Period Weeks   ? Status On-going   ?  ? SLP LONG TERM GOAL #3  ? Title pt will demo functional mod complex conversation with modified independence in 3 sessions   ? Time 8   ? Period Weeks   ? Status On-going   ?  ? SLP LONG TERM GOAL #4  ? Title pt will engage in 15 minutes mod complex-complex conversation with modified independence in 3 sessions   ? Time 12   ? Period Weeks   ? Status On-going   ? ?  ?  ? ?  ? ? ? Plan - 10/11/21 1404   ? ? Clinical Impression  Statement Pt presents today with mod verbal expression deficit, and mod-severe written language deficit. Pt would cont to benefit from skilled ST targeting language expression (verbal and written).   ? Speech Therapy Frequency 2x / week   ? Duration 12 weeks   ? Treatment/Interventions Functional tasks;Patient/family education;Compensatory strategies;Multimodal communcation approach;Environmental controls;SLP instruction and feedback;Language facilitation   ? Potential to Achieve Goals Good   ? SLP Home Exercise Plan therapy apps and written/verbal language tasks provided today   ? Consulted and Agree with Plan of Care Patient   ? ?  ?  ? ?  ? ? ?Patient will benefit from skilled therapeutic intervention in order to improve the following deficits and impairments:   ?Aphasia ? ? ? ?Problem List ?Patient Active Problem List  ? Diagnosis Date Noted  ? Hyperlipidemia LDL goal <100 10/10/2021  ? Stroke (cerebrum) ([place]) 09/25/2021  ? 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 Acute respiratory insufficiency, postoperative   ? Cardiac resynchronization therapy defibrillator (CRT-D) in place 09/10/2019  ? 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 Complete heart block ([place]) 09/07/2018  ? Chronic systolic CHF (congestive heart failure) ([place]) 07/07/2018  ? 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 Tinea pedis of both feet 02/12/2018  ? Onychomycosis 02/12/2018  ? Chronic atrial flutter ([place]) 11/01/2016  ? ATRIAL FLUTTER 09/28/2010  ? BRADYCARDIA, CHRONIC 09/28/2010  ? ? ?Ricardo Jones, Ricardo Jones ?10/11/2021, 2:05 PM ? ?Ricardo Jones ?[facility] ?[address], [address] ?[place], [place], [address] ?Phone: [phone]   Fax:  [phone] ? ? ?Name: Ricardo Jones ?MRN: [id number] ?Date of Birth: 12-Nov-1970 ? ?

## 2021-10-17 ENCOUNTER — Encounter: Payer: BC Managed Care – PPO | Admitting: Occupational Therapy

## 2021-10-17 ENCOUNTER — Other Ambulatory Visit (HOSPITAL_COMMUNITY): Payer: BC Managed Care – PPO

## 2021-10-17 ENCOUNTER — Ambulatory Visit: Payer: BC Managed Care – PPO

## 2021-10-20 ENCOUNTER — Ambulatory Visit (HOSPITAL_COMMUNITY)
Admission: RE | Admit: 2021-10-20 | Discharge: 2021-10-20 | Disposition: A | Discharge status: Home / Self Care | Payer: BC Managed Care – PPO | Source: Ambulatory Visit | Attending: Family Medicine | Admitting: Family Medicine

## 2021-10-20 DIAGNOSIS — I5022 Chronic systolic (congestive) heart failure: Secondary | ICD-10-CM | POA: Insufficient documentation

## 2021-10-20 LAB — BASIC METABOLIC PANEL
Anion gap: 5 (ref 5–15)
BUN: 14 mg/dL (ref 6–20)
CO2: 29 mmol/L (ref 22–32)
Calcium: 9.7 mg/dL (ref 8.9–10.3)
Chloride: 106 mmol/L (ref 98–111)
Creatinine, Ser: 0.99 mg/dL (ref 0.61–1.24)
GFR, Estimated: >60 mL/min (ref >60)
Glucose, Bld: 99 mg/dL (ref 70–99)
Potassium: 4.4 mmol/L (ref 3.5–5.1)
Sodium: 140 mmol/L (ref 135–145)

## 2021-10-24 ENCOUNTER — Ambulatory Visit: Payer: BC Managed Care – PPO | Attending: Neurology

## 2021-10-24 ENCOUNTER — Encounter: Payer: BC Managed Care – PPO | Admitting: Occupational Therapy

## 2021-10-25 ENCOUNTER — Other Ambulatory Visit (HOSPITAL_COMMUNITY): Payer: Self-pay

## 2021-10-26 ENCOUNTER — Ambulatory Visit: Payer: BC Managed Care – PPO

## 2021-10-31 ENCOUNTER — Encounter (HOSPITAL_COMMUNITY): Payer: Self-pay | Admitting: Cardiology

## 2021-10-31 ENCOUNTER — Ambulatory Visit (HOSPITAL_COMMUNITY)
Admission: RE | Admit: 2021-10-31 | Discharge: 2021-10-31 | Disposition: A | Discharge status: Home / Self Care | Payer: BC Managed Care – PPO | Source: Ambulatory Visit | Attending: Cardiology | Admitting: Cardiology

## 2021-10-31 VITALS — BP 138/80 | HR 69 | Wt 251.2 lb

## 2021-10-31 DIAGNOSIS — I110 Hypertensive heart disease with heart failure: Secondary | ICD-10-CM | POA: Diagnosis not present

## 2021-10-31 DIAGNOSIS — Z7901 Long term (current) use of anticoagulants: Secondary | ICD-10-CM | POA: Diagnosis not present

## 2021-10-31 DIAGNOSIS — I4892 Unspecified atrial flutter: Secondary | ICD-10-CM | POA: Insufficient documentation

## 2021-10-31 DIAGNOSIS — I5022 Chronic systolic (congestive) heart failure: Secondary | ICD-10-CM | POA: Diagnosis not present

## 2021-10-31 DIAGNOSIS — I428 Other cardiomyopathies: Secondary | ICD-10-CM | POA: Diagnosis not present

## 2021-10-31 DIAGNOSIS — I255 Ischemic cardiomyopathy: Secondary | ICD-10-CM | POA: Diagnosis not present

## 2021-10-31 DIAGNOSIS — I442 Atrioventricular block, complete: Secondary | ICD-10-CM | POA: Diagnosis not present

## 2021-10-31 DIAGNOSIS — Z8673 Personal history of transient ischemic attack (TIA), and cerebral infarction without residual deficits: Secondary | ICD-10-CM | POA: Insufficient documentation

## 2021-10-31 LAB — BASIC METABOLIC PANEL
Anion gap: 8 (ref 5–15)
BUN: 16 mg/dL (ref 6–20)
CO2: 25 mmol/L (ref 22–32)
Calcium: 9.7 mg/dL (ref 8.9–10.3)
Chloride: 107 mmol/L (ref 98–111)
Creatinine, Ser: 1.02 mg/dL (ref 0.61–1.24)
GFR, Estimated: >60 mL/min (ref >60)
Glucose, Bld: 94 mg/dL (ref 70–99)
Potassium: 4.1 mmol/L (ref 3.5–5.1)
Sodium: 140 mmol/L (ref 135–145)

## 2021-10-31 LAB — BRAIN NATRIURETIC PEPTIDE: B Natriuretic Peptide: 130.3 pg/mL — ABNORMAL HIGH (ref 0.0–100.0)

## 2021-10-31 MED ORDER — BISOPROLOL FUMARATE 5 MG PO TABS
5.0000 mg | ORAL_TABLET | Freq: Every day | ORAL | 3 refills | Status: DC
Start: 2021-10-31 — End: 2021-11-28

## 2021-10-31 MED ORDER — SPIRONOLACTONE 25 MG PO TABS: 12.5000 mg | ORAL_TABLET | Freq: Every day | ORAL | 3 refills | Status: DC

## 2021-10-31 NOTE — Patient Instructions (Signed)
Medication Changes: ? ?Stop Carvedilol ? ?Start Spironolactone 12.5 mg daily ? ?Start Bisoprolol 5 mg daily ? ?Lab Work: ? ?Labs done today, your results will be available in MyChart, we will contact you for abnormal readings. ? ? ?Testing/Procedures: ? ?Repeat blood work in 10 days ? ?Referrals: ? ?Dr. Nira Conn. Her office will call you to arrange your appointment ? ?Please follow up with our heart failure pharmacist in 3 weeks  ? ? ?Special Instructions // Education: ? ?nonw ? ?Follow-Up in: 6 weeks  ? ?At the [facility], you and your health needs are our priority. We have a designated team specialized in the treatment of Heart Failure. This Care Team includes your primary Heart Failure Specialized Cardiologist (physician), Advanced Practice Providers (APPs- Physician Assistants and Nurse Practitioners), and Pharmacist who all work together to provide you with the care you need, when you need it.  ? ?You may see any of the following providers on your designated Care Team at your next follow up: ? ?Dr Glori Bickers ?Dr Loralie Champagne ?Darrick Grinder, NP ?Lyda Jester, PA ?Jessica Milford,NP ?Marlyce Huge, PA ?Audry Riles, PharmD ? ? ?Please be sure to bring in all your medications bottles to every appointment.  ? ?Need to Contact us: ? ?If you have any questions or concerns before your next appointment please send [place] a message through [place] or call our office at [phone].   ? ?TO LEAVE A MESSAGE FOR THE NURSE SELECT OPTION 2, PLEASE LEAVE A MESSAGE INCLUDING: ?YOUR NAME ?DATE OF BIRTH ?CALL BACK NUMBER ?REASON FOR CALL**this is important as we prioritize the call backs ? ?YOU WILL RECEIVE A CALL BACK THE SAME DAY AS LONG AS YOU CALL BEFORE 4:00 PM ? ? ?

## 2021-11-01 ENCOUNTER — Ambulatory Visit: Payer: BC Managed Care – PPO

## 2021-11-01 ENCOUNTER — Encounter: Payer: BC Managed Care – PPO | Admitting: Occupational Therapy

## 2021-11-01 ENCOUNTER — Telehealth: Payer: Self-pay | Admitting: Neurology

## 2021-11-01 NOTE — Telephone Encounter (Signed)
I spoke to the patient's wife. Stroke on 09/25/21 (seen by Dr. Erlinda Hong at [facility]). The reported symptoms have continued since the event (no better or worse). Says the cardiologist is discussing changing his beta blocker to see if he will get any improvement. She wanted his appt moved to an earlier date. Originially scheduled 12/02/21. Moved to an opening on 11/16/21. ?

## 2021-11-01 NOTE — Progress Notes (Signed)
? ? ? ?Primary Care: Patient, No Pcp Per (Inactive) ?HF Cardiologist: Dr. Aundra Dubin ?EP: Dr. Caryl Comes ? ?HPI: ?51 y.o. male w/ chronic systolic heart failure, CHB and VT s/p Medtronic CRT-D, atrial flutter and CVA.  ? ?He has a strong family history of cardiomyopathy.  Uncle with heart transplant, 2 cousins with pacemakers. His mother has had genetic testing and was LMNA gene mutation positive. ? ?He was initially followed by Dr. Rayann Heman and Dr. Acie Fredrickson for atrial flutter and bradycardia. Declined atrial flutter ablation in the past. 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 Subsequently was referred for cMRI and PET scan in 2019. cMRI showed non-coronary pattern LGE suspicious for infiltrative disease. PET scan at [facility] showed abnormal FDG uptake consistent with inflamatory process of the myocardium, most especially involving the basal inferior, basal inferoseptal and mid inferoseptal walls. This was also concerning for possible sarcoid; however he had chest CT that showed no pulmonary evidence for sarcoid. Given no definitive diagnosis, pt declined empiric immunosuppressive treatment, but did undergo Medtronic CRT-D placement in 2/20 by Dr. Lovena Le. He has since been followed primarily by Dr. Caryl Comes.  ? ?Echo 01/2014 EF 50-55%, RV normal  ?Echo 11/2016 EF 40-45%, RV normal  ?Echo 04/2018 EF 40-45%, RV normal  ?Echo 02/2019 EF 50-55%, RV normal  ?Echo 07/2021 showed mildly reduced LVEF, 45-50%, normal RV. Moderate BAE.  ? ?Admitted to [facility] 09/25/21 w/ right MCA infarct due to right M1 occlusion, likely secondary to atrial flutter and cardiomyopathy. Unfortunately, had not been on anticoagulation prior to this. He underwent successful mechanical thrombectomy by IR. Patient 's symptoms improved rapidly.  Cardiology was consulted as TTE showed EF of 30-35%, mild RV dilation, severe biatrial enlargement.  He was placed on limited medical therapy to allow for permissive hypertension following  stroke. Started on Coreg 3.15 mg bid. Eliquis was started for atrial flutter. Atorvastatin added (LDL 100 mg/dL). Hgb A1c 6.0. Baseline SCr < 1.0. Discharged home on 09/28/21. Referred to HF clinic.  ? ?When I saw him initially, I obtained genetic testing.  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  ? ?He returns today for followup of CHF.  He denies exertional dyspnea but he is weak and tired.  He has been going to the gym, doing cardio and lifting light weights.  Still feeling "foggy."  Has some word-finding difficulty and notes decreased finger dexterity.  He notes numbness/tingling in his arms/hands that he feels was caused by Coreg (started when he started Coreg).  He is back at work as an Arboriculturist.  No orthopnea/PND.  No chest pain.  Wife reports that he snores.  ? ?ECG (personally reviewed): A-BiV paced.  ? ?MDT device interrogation: Stable thoracic impedance, >99% BiV pacing ? ?Labs (3/23): LDL 100 ?Labs (4/23): K 4.4, creatinine 0.99 ? 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  Medtronic CRT-D device.  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  ?- Echo 01/2014 EF 50-55%, RV normal  ?- Echo 11/2016 EF 40-45%, RV normal  ?- Echo 04/2018 EF 40-45%, RV normal  ?- Cardiac MRI (11/19): LV EF 44%, RV EF 62%, patchy mid-wall LGE in the basal septal and lateral walls.  ?- CPET (12/19, [facility]): abnormal FDG uptake consistent with inflammatory process of the myocardium. More especially involving the basal-inferior, Basal inferoseptal and mid inferoseptal walls. In the proper clinical  settings this may represent active sarcoidosis in the myocardium. ?- Echo 02/2019 EF 50-55%, RV normal  ?- Echo 07/2021 showed mildly reduced LVEF, 45-50%, normal RV. Moderate BAE.  ?- Echo 3/23 EF 30-35%, mild RV dilation, severe biatrial enlargement. ?2. 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   ?3. Atrial flutter: Paroxysmal.  ?4. CVA: Admitted to [facility] 09/25/21 w/ right MCA infarct due to right M1 occlusion, likely secondary to atrial flutter.  ?5. H/o VT ? ?Review of Systems: All systems reviewed and negative except as per HPI.  ? ?Current Outpatient Medications  ?Medication Sig Dispense Refill  ? bisoprolol (ZEBETA) 5 MG tablet Take 1 tablet (5 mg total) by mouth daily. 90 tablet 3  ? spironolactone (ALDACTONE) 25 MG tablet Take 0.5 tablets (12.5 mg total) by mouth daily. 45 tablet 3  ? apixaban (ELIQUIS) 5 MG TABS tablet Take 1 tablet (5 mg total) by mouth 2 (two) times daily. 60 tablet 1  ? atorvastatin (LIPITOR) 40 MG tablet Take 1 tablet (40 mg total) by mouth daily. 30 tablet 1  ? sacubitril-valsartan (ENTRESTO) 24-26 MG TAKE 1 TABLET BY MOUTH TWICE A DAY 60 tablet 11  ? ?No current facility-administered medications for this encounter.  ? ? ?No Known Allergies ? ?  ?Social History  ? ?Socioeconomic History  ? Marital status: Single  ?  Spouse name: Not on file  ? Number of children: Not on file  ? Years of education: Not on file  ? Highest education level: Not on file  ?Occupational History  ? Not on file  ?Tobacco Use  ? Smoking status: Never  ? Smokeless tobacco: Never  ?Vaping Use  ? Vaping Use: Never used  ?Substance and Sexual Activity  ? Alcohol use: Yes  ?  Comment: occassionally  ? Drug use: No  ? Sexual activity: Not on file  ?Other Topics Concern  ? Not on file  ?Social History Narrative  ? 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 ?Social Determinants of Health  ? ?Financial Resource Strain: Not on file  ?Food Insecurity: Not on file  ?Transportation Needs: Not on file  ?Physical Activity: Not on file  ?Stress: Not on file  ?Social Connections: Not on file  ?Intimate Partner Violence: Not on file  ? ? ?  ?Family History  ?Problem Relation Age of Onset  ? Asthma Mother   ? Cancer Maternal Grandmother   ? Cancer Maternal Grandfather   ? Cancer Paternal  Grandmother   ? Heart failure Maternal Uncle   ? Bradycardia Other   ?     multiple family members with pacemakers  ? Heart failure Maternal Uncle s/p Cardiac Transplant  ? ?Maternal Cousin s/p Cardiac Transplant  ?   ? ? ?Vitals:  ? 10/31/21 1559  ?BP: 138/80  ?Pulse: 69  ?SpO2: 96%  ?Weight: 113.9 kg (251 lb 3.2 oz)  ? ? ?PHYSICAL EXAM: ?General: NAD ?Neck: No JVD, no thyromegaly or thyroid nodule.  ?Lungs: Clear to auscultation bilaterally with normal respiratory effort. ?CV: Nondisplaced PMI.  Heart regular S1/S2, no S3/S4, no murmur.  No peripheral edema.  No carotid bruit.  Normal pedal pulses.  ?Abdomen: Soft, nontender, no hepatosplenomegaly, no distention.  ?Skin: Intact without lesions or rashes.  ?Neurologic: Alert and oriented x 3.  ?Psych: Normal affect. ?Extremities: No clubbing or cyanosis.  ?HEENT: Normal.  ? ?ASSESSMENT & PLAN: ? ?1. 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  Patient has had DCM associated with CHB and with atrial flutter.  Strong family history of cardiomyopathy and conduction disease/pacemaker placement.  Patient's mother also has LMNA gene mutation.  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 Patient has MDT CRT-D.  cMRI in 11/19 showed LV EF 44%, RV EF 62%, patchy mid-wall LGE in the basal septal and lateral walls.  cPET in 12/19 showed abnormal uptake in myocardium concerning for inflammatory process.  There was initial concern for cardiac sarcoidosis as a unifying diagnosis, but no sign of pulmonary sarcoidosis on CT chest and patient is positive for LMNA gene mutation.  The cMRI and cPET patterns that the patient had can be seen with genetic cardiomyopathies, often appearing to masquerade as cardiac sarcoidosis.  I think cardiac sarcoidosis is unlikely and would not immunosuppress.  Most recent echo in 3/23  showed EF 30-35%, mild RV dilation. NYHA class II, not volume overloaded.  ?- He thinks Coreg is causing neuropathic symptoms.  Suspect unlikely, but will stop Coreg and start bisoprolol 5 mg daily.  ?- Co

## 2021-11-01 NOTE — Telephone Encounter (Signed)
Pt wife called stating pt is experiencing neuropathy in arm and hands and brain fog. Would like to know if this is normal after a stroke. ?[phone] ?

## 2021-11-02 DIAGNOSIS — Z8679 Personal history of other diseases of the circulatory system: Secondary | ICD-10-CM | POA: Insufficient documentation

## 2021-11-03 ENCOUNTER — Encounter: Payer: Self-pay | Admitting: Internal Medicine

## 2021-11-03 ENCOUNTER — Ambulatory Visit: Payer: BC Managed Care – PPO | Admitting: Internal Medicine

## 2021-11-03 VITALS — BP 112/62 | HR 73 | Ht 79.0 in | Wt 248.0 lb

## 2021-11-03 DIAGNOSIS — Z95810 Presence of automatic (implantable) cardiac defibrillator: Secondary | ICD-10-CM

## 2021-11-03 DIAGNOSIS — Z8679 Personal history of other diseases of the circulatory system: Secondary | ICD-10-CM

## 2021-11-03 DIAGNOSIS — I442 Atrioventricular block, complete: Secondary | ICD-10-CM | POA: Diagnosis not present

## 2021-11-03 DIAGNOSIS — I5022 Chronic systolic (congestive) heart failure: Secondary | ICD-10-CM

## 2021-11-03 NOTE — Progress Notes (Signed)
? ? ? ? ?Patient Care Team: ?Patient, No Pcp Per (Inactive) as PCP - General (General Practice) ?Nahser, Wonda Cheng, MD as PCP - Cardiology (Cardiology) ?Deboraha Sprang, MD as PCP - Electrophysiology (Cardiology) ?Thompson Grayer, MD as Attending Physician (Cardiology) ? ? ?HPI ? ?Ricardo Jones is a 51 y.o. 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  High resolution CT scan unrevealing of sarcoid.  Progressive symptomatic bradycardia with his complete heart block received CRT -D 2/20.  There is also been concern for Lamin cardiomyopathy, as it has been identified in his mother and can be a phenocopy of sarcoid; he is Lamin positive ? ? ?He has had atrial flutter with a gradually slowing cycle length;  He ended up spontaneously converting to sinus rhythm (presumed given the progressively slowing atrial cycle lengths [250--600 ms]  associated with his flutter noted from 2013--19) in October with reversion about 2 weeks ago back to atrial flutter now with an atrial cycle length of about 240 ms.    ?  ?3/23 presented with left-sided weakness and aphasia, right MCA stroke treated with TNKase and thrombectomy.  He was discharged on apixaban and atorvastatin with a plan to initiate GDMT as an outpatient which has been done with close follow-up with the heart failure clinic.  He is currently on Entresto bisoprolol and spironolactone.  Some of fuzziness of head may be associated with lower blood pressure.  Overall sense of weakness from the stroke and he is left-handed and some loss of left hand dexterity ? ?The atrial fibrillation burden at the time of the stroke was scant.  This highlights 2 issues, the first is in the question previously identified in the literature that strokes in patients with atrial fibrillation are not always temporally related to the presence of the atrial  fibrillation raising the specter as to other causes and the second is indeed whether the source of his embolism was his ventricle not his atrium ? ?DATE TEST EF    ?7/15 Echo   50-55 % Severe RAE/LAE  ?5/18 Echo  40-45 % Mild RAE/LAE  ?10/19 Echo  40-45%    ?11/19 cMRI   Severe BAE DGE+>Sarcoid  ?12/19 cPET   + Consistent Sarcoid  ?8/20 Echo  50-55%   ?1/23 Echo  45-50%   ?3/23 Echo  30-35% Severe BAE  ?     ?  ?  ?Date Cr K Hgb  ?6/20 1.01 4.7 14.3 (2/20)  ?      ? ? ? ?Thromboembolic risk factors (CHF-1) for a CHADSVASc Score of sort of 1 ?  ? ? ? ?Records and Results Reviewed*  ? ?Past Medical History:  ?Diagnosis Date  ? Acute right MCA stroke ([place]) 09/25/2021  ? s/p thrombectomy  ? Bradycardia   ? asymptomatic  ? Chronic systolic CHF (congestive heart failure), NYHA class 2 ([place])   ? Complete heart block ([facility])   ? s/p CRT-D  ? Hyperlipidemia LDL goal <100   ? S/P dilatation of esophageal stricture   ? Typical atrial flutter ([place])   ? ? ?Past Surgical History:  ?Procedure Laterality Date  ? BIV ICD INSERTION CRT-D N/A 09/08/2018  ? Procedure: BIV ICD INSERTION CRT-D;  Surgeon: Evans Lance, MD;  Location: [facility];  Service: Cardiovascular;  Laterality: N/A;  ? IR CT HEAD LTD  09/25/2021  ? IR PERCUTANEOUS ART THROMBECTOMY/INFUSION  INTRACRANIAL INC DIAG ANGIO  09/25/2021  ? RADIOLOGY WITH ANESTHESIA N/A 09/25/2021  ? Procedure: IR WITH ANESTHESIA;  Surgeon: Luanne Bras, MD;  Location: [facility];  Service: Radiology;  Laterality: N/A;  ? THROAT SURGERY  2012  ? ? ?Current Meds  ?Medication Sig  ? apixaban (ELIQUIS) 5 MG TABS tablet Take 1 tablet (5 mg total) by mouth 2 (two) times daily.  ? atorvastatin (LIPITOR) 40 MG tablet Take 1 tablet (40 mg total) by mouth daily.  ? bisoprolol (ZEBETA) 5 MG tablet Take 1 tablet (5 mg total) by mouth daily.  ? sacubitril-valsartan (ENTRESTO) 24-26 MG TAKE 1 TABLET BY MOUTH TWICE A DAY  ? spironolactone (ALDACTONE) 25 MG tablet Take 0.5 tablets (12.5 mg total) by  mouth daily.  ? ? ?No Known Allergies ? ? ? ?Review of Systems negative except from HPI and PMH ? ?Physical Exam ?BP 112/62 (BP Location: Left Arm, Patient Position: Sitting, Cuff Size: Normal)   Pulse 73   Ht '6\' 7"'$  (2.007 m)   Wt 248 lb (112.5 kg)   SpO2 98%   BMI 27.94 kg/m?  ?Well developed and well nourished in no acute distress ?HENT normal ?Neck supple with JVP-flat ?Clear ?Device pocket well healed; without hematoma or erythema.  There is no tethering  ?Regular rate and rhythm, no  murmur ?Abd-soft with active BS ?No Clubbing cyanosis edema ?Skin-warm and dry ?A & Oriented  Grossly normal sensory and motor function ? ?ECG AV pacing at 73 ?Intervals 06/30/1946 ? ?Assessment and  Plan ? ?Cardiomyopathy-LMN  ? ?Complete heart block ? ?Atrial flutter progressively slowing more recently occurring at a rapid rate ? ?CRT-D  ? ?PVCs  ? ?Stroke ? ?Infrequent recurrence of atrial arrhythmia which have been flutter, interestingly not temporally associated with his stroke (As above)  ?Continue his GDMT, but have suggested he decrease his bisoprolol from 5--2.5 about a week before he sees Dr. DM so as to see whether his symptoms improve at all with the lower dose. ? ?At some point his 53 year old daughter should undergo Lamin testing ? ?

## 2021-11-03 NOTE — Patient Instructions (Signed)

## 2021-11-06 ENCOUNTER — Ambulatory Visit: Payer: BC Managed Care – PPO

## 2021-11-06 ENCOUNTER — Encounter: Payer: BC Managed Care – PPO | Admitting: Occupational Therapy

## 2021-11-09 ENCOUNTER — Ambulatory Visit (HOSPITAL_COMMUNITY)
Admission: RE | Admit: 2021-11-09 | Discharge: 2021-11-09 | Disposition: A | Discharge status: Home / Self Care | Payer: BC Managed Care – PPO | Source: Ambulatory Visit | Attending: Cardiology | Admitting: Cardiology

## 2021-11-09 ENCOUNTER — Ambulatory Visit: Payer: BC Managed Care – PPO

## 2021-11-09 VITALS — BP 80/54 | HR 69 | Wt 248.0 lb

## 2021-11-09 DIAGNOSIS — I5022 Chronic systolic (congestive) heart failure: Secondary | ICD-10-CM | POA: Insufficient documentation

## 2021-11-09 LAB — BASIC METABOLIC PANEL
Anion gap: 5 (ref 5–15)
BUN: 17 mg/dL (ref 6–20)
CO2: 29 mmol/L (ref 22–32)
Calcium: 9.2 mg/dL (ref 8.9–10.3)
Chloride: 106 mmol/L (ref 98–111)
Creatinine, Ser: 1.04 mg/dL (ref 0.61–1.24)
GFR, Estimated: >60 mL/min (ref >60)
Glucose, Bld: 104 mg/dL — ABNORMAL HIGH (ref 70–99)
Potassium: 4.3 mmol/L (ref 3.5–5.1)
Sodium: 140 mmol/L (ref 135–145)

## 2021-11-09 NOTE — Addendum Note (Signed)
Encounter addended by: Harvie Junior, CMA on: 11/09/2021 10:27 AM ? Actions taken: Vitals modified

## 2021-11-09 NOTE — Progress Notes (Signed)
Pt seen today for lab visit c/o kow blood pressure. Bp at home before meds 100/60's. Today in clinic after meds bp 80/54. Per [place] [facility] hold entresto until pharmD visit. Pt aware.  ?

## 2021-11-09 NOTE — Addendum Note (Signed)
Encounter addended by: Harvie Junior, CMA on: 11/09/2021 10:20 AM ? Actions taken: Order list changed, Charge Capture section accepted, Clinical Note Signed, Vitals modified

## 2021-11-10 ENCOUNTER — Other Ambulatory Visit (HOSPITAL_COMMUNITY): Payer: BC Managed Care – PPO

## 2021-11-13 NOTE — Progress Notes (Signed)
?Advanced Heart Failure Clinic Note  ? ?Primary Care: Patient, No Pcp Per (Inactive) ?HF Cardiologist: Dr. Aundra Dubin ?EP: Dr. Caryl Comes ? ?HPI:  ?51 y.o. male w/ chronic systolic heart failure, CHB and VT s/p Medtronic CRT-D, atrial flutter and CVA.  ?  ?He has a strong family history of cardiomyopathy.  Uncle with heart transplant, 2 cousins with pacemakers. His mother has had genetic testing and was LMNA gene mutation positive. ?  ?He was initially followed by Dr. Rayann Heman and Dr. Acie Fredrickson for atrial flutter and bradycardia. Declined atrial flutter ablation in the past. Later developed CHB and growing concern for possible cardiac sarcoidosis. Subsequently was referred for cMRI and PET scan in 2019. cMRI showed non-coronary pattern LGE suspicious for infiltrative disease. PET scan at [facility] showed abnormal FDG uptake consistent with inflamatory process of the myocardium, most especially involving the basal inferior, basal inferoseptal and mid inferoseptal walls. This was also concerning for possible sarcoid; however he had chest CT that showed no pulmonary evidence for sarcoid. Given no definitive diagnosis, pt declined empiric immunosuppressive treatment, but did undergo Medtronic CRT-D placement in 08/2018 by Dr. Lovena Le. He has since been followed primarily by Dr. Caryl Comes.  ?  ?Echo 01/2014 EF 50-55%, RV normal  ?Echo 11/2016 EF 40-45%, RV normal  ?Echo 04/2018 EF 40-45%, RV normal  ?Echo 02/2019 EF 50-55%, RV normal  ?Echo 07/2021 showed mildly reduced LVEF, 45-50%, normal RV. Moderate BAE.  ?  ?Admitted to [facility] 09/25/21 w/ right MCA infarct due to right M1 occlusion, likely secondary to atrial flutter and cardiomyopathy. Unfortunately, had not been on anticoagulation prior to this. He underwent successful mechanical thrombectomy by IR. Patient 's symptoms improved rapidly.  Cardiology was consulted as TTE showed EF of 30-35%, mild RV dilation, severe biatrial enlargement.  He was placed on limited medical therapy to allow for  permissive hypertension following stroke. Started on carvedilol 3.15 mg BID. Eliquis was started for atrial flutter. Atorvastatin added (LDL 100 mg/dL). Hgb A1c 6.0. Baseline SCr < 1.0. Discharged home on 09/28/21. Referred to HF clinic.  ?  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  ?  ?Returned to [facility] on 10/31/21 with Dr. Aundra Dubin for followup of CHF.  He denied exertional dyspnea but he was weak and tired.  He had been going to the gym, doing cardio and lifting light weights.  Still felt "foggy."  Had some word-finding difficulty and noted decreased finger dexterity.  He noted numbness/tingling in his arms/hands that he felt was caused by carvedilol (started when he started carvedilol).  He was back at work as an Arboriculturist.  No orthopnea/PND.  No chest pain.  Wife reported that he snores.  ?  ? ?Today he returns to HF clinic for pharmacist medication titration. At last visit with MD carvedilol was discontinued and bisoprolol 5 mg daily was initiated. Spironolactone 12.5 mg daily was initiated. Additionally, he returned for nurse visit on 11/09/21 for complaints of hypotension. BP at home was 100/60s before medications and decreased to 80/54 in clinic after medications were taken. He was instructed to hold Entresto until follow up visit. After reviewing medication list today, noted that he is taking spironolactone 25 mg daily instead of 12.5 mg daily because he read the instructions wrong. Additionally, he is taking bisoprolol 2.5 mg daily instead of 5 mg daily as instructed by Dr. Caryl Comes for low BP and fogginess. Overall he is feeling well in clinic  today. Notes he still has "fogginess" but believes it has improved since stopping the Entresto and decreasing the bisoprolol. Dizziness has also improved. No CP or palpitations. No SOB/DOE. Weight at home has been stable. He does not take any loop diuretic. No [name], PND or orthopnea.  ? ? ? ?HF  Medications: ?Bisoprolol 5 mg daily - reported taking 2.5 mg daily ?Spironolactone 12.5 mg daily - reported taking 25 mg daily ? ?Does the patient have any problems obtaining medications due to transportation or finances?   No - BCBS SHP ? ?Understanding of regimen: good ?Understanding of indications: good ?Potential of compliance: good ?Patient understands to avoid NSAIDs. ?Patient understands to avoid decongestants. ?  ? ?Pertinent Lab Values: ?Serum creatinine 1.04, BUN 17, Potassium 4.3, Sodium 140 ? ?Vital Signs: ?Weight: 246.2 lbs (last clinic weight: 248 lbs) ?Blood pressure: 110/68  ?Heart rate: 73  ? ?Assessment/Plan: ?1. 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  Patient has had DCM associated with CHB and with atrial flutter.  Strong family history of cardiomyopathy and conduction disease/pacemaker placement.  Patient's mother also has LMNA gene mutation.  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 Patient has MDT CRT-D.  cMRI in 05/2018 showed LV EF 44%, RV EF 62%, patchy mid-wall LGE in the basal septal and lateral walls.  cPET in 06/2018 showed abnormal uptake in myocardium concerning for inflammatory process.  There was initial concern for cardiac sarcoidosis as a unifying diagnosis, but no sign of pulmonary sarcoidosis on CT chest and patient is positive for LMNA gene mutation.  The cMRI and cPET patterns that the patient had can be seen with genetic cardiomyopathies, often appearing to masquerade as cardiac sarcoidosis.  Cardiac sarcoidosis is unlikely and would not start immunosuppression.  Most recent echo in 09/2021 showed EF 30-35%, mild RV dilation.  ?- NYHA class II, euvolemic on exam.   ?- Continue bisoprolol 2.5 mg daily. Previously failed carvedilol as he thought it was causing neuropathic symptoms.   ?- Continue  spironolactone 25 mg daily ?-Start Farxiga 10 mg daily ?- Now off Entresto with significant hypotension ?- Previously referred to Dr. Broadus John for genetic counseling.  ?2. Paroxysmal Atrial Flutter: Dates back to at least 2012, previously offered AFL ablation but declined.  Looking at device interrogation, he has had a fairly substantial burden of AFL over time, though he is currently in NSR.  ?- Long-term Eliquis with CVA history.  ?- Follows with Dr. Caryl Comes  ?3. Rt MCA CVA: Recent MCA infarct due to right M1 occlusion 09/2021. Likely secondary to atrial flutter and cardiomyopathy (LVEF 30-35%). Had not been anticoagulated. s/p mechanical thrombectomy by IR ?- Continue Eliquis 5 mg BID.  ?- Has residual neurological symptoms, follows with neurology ?- Continue statin.  ?4. CHB: MDT CRT-D.  >99% paced on device interrogation.  ?- concern for LMNA CMP as outlined above  ?5. H/o VT: Has ICD, followed by Dr. Caryl Comes  ?- no recent VT on device interrogation  ?6. Suspect OSA: previously referred for sleep study.  ? ?Follow up 3 weeks with Dr. Aundra Dubin ? ? ?Audry Riles, PharmD, BCPS, BCCP, CPP ?Heart Failure Clinic Pharmacist ?[phone] ?  ?

## 2021-11-14 ENCOUNTER — Encounter: Payer: BC Managed Care – PPO | Admitting: Occupational Therapy

## 2021-11-16 ENCOUNTER — Encounter: Payer: Self-pay | Admitting: Neurology

## 2021-11-16 ENCOUNTER — Ambulatory Visit: Payer: BC Managed Care – PPO | Admitting: Neurology

## 2021-11-16 VITALS — BP 112/69 | Ht 79.0 in | Wt 248.0 lb

## 2021-11-16 DIAGNOSIS — I4892 Unspecified atrial flutter: Secondary | ICD-10-CM

## 2021-11-16 DIAGNOSIS — I6602 Occlusion and stenosis of left middle cerebral artery: Secondary | ICD-10-CM | POA: Diagnosis not present

## 2021-11-16 DIAGNOSIS — R4701 Aphasia: Secondary | ICD-10-CM | POA: Diagnosis not present

## 2021-11-16 NOTE — Progress Notes (Signed)
? ?[facility] ?[address] ?[place]. [place] [address] ?[phone] ? ?     OFFICE CONSULT NOTE ? ?Mr. Ricardo Jones ?Date of Birth:  04-26-1971 ?Medical Record Number:  [id number]  ? ?Referring MD: Rosalin Hawking ? ?Reason for Referral: Stroke ? ?HPI: Ricardo Jones is a pleasant 51 year old Caucasian male seen today for initial office consultation visit following stroke in March 2023.  History is obtained from the patient and his wife and review of electronic medical records and I personally reviewed pertinent available imaging films in PACS.  He has past medical history of congestive heart failure, cardiomyopathy, atrial flutter not on anticoagulation due to a low CHA2DS2-VASc score of 1.  He presented on 09/25/2021 for evaluation as a code stroke due to sudden onset of aphasia and left hemiparesis.  Patient was in the gym on elliptical when a bystander noticed that he was unresponsive and was acting off.  The patient needed assistance to get off the elliptical and EMS was activated.  On arrival EMS noticed right gaze deviation patient was nonverbal did not follow commands and had left hemiparesis.  A code stroke was activated in route.  NIH stroke scale was 22 upon arrival.  CT head was unremarkable he was given IV TNK and CT angiogram was obtained emergently which showed right M1 occlusion.  Patient was taken for emergent mechanical thrombectomy which was performed successfully with TICI 3 revascularization.  Patient was admitted to the intensive care unit for blood pressure was tightly controlled.  He did well and improved shortly thereafter.  MRI scan showed acute infarcts in the right basal ganglia and a portion of the posterior limb of internal capsule with few additional punctate scattered infarcts.  No hemorrhage.  Echocardiogram showed ejection fraction of 30 to 35% with global hypokinesis but no definite clot.  Patient had an ICD and interrogation did reveal atrial flutter and  fibrillation.  Patient was started on Eliquis and on Lipitor.  He was discharged home and had only a single visit with outpatient PT OT and was discharged and has done extremely well.  He states he is 96% better.  He still has some trouble with his handwriting which is not as good.  He has intermittent numbness and the medial 2 fingers of the left hand when occasionally in the right.  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  He has no trouble walking.  He is tolerating Eliquis well with only minor bruising and no bleeding.  His blood pressure is well controlled today it is 112/69.  Tolerating Lipitor without muscle aches and pains.  He does have regular follow-up visits with Dr. Aundra Dubin his cardiologist. ? ?ROS:   ?14 system review of systems is positive for-fine motor skills, numbness in the hand, writing problems, minor bruising all other systems negative ? ?PMH:  ?Past Medical History:  ?Diagnosis Date  ? Acute right MCA stroke ([facility]) 09/25/2021  ? s/p thrombectomy  ? Bradycardia   ? asymptomatic  ? Chronic systolic CHF (congestive heart failure), NYHA class 2 ([facility])   ? Complete heart block ([facility])   ? s/p CRT-D  ? Hyperlipidemia LDL goal <100   ? S/P dilatation of esophageal stricture   ? Typical atrial flutter ([facility])   ? ? ?Social History:  ?Social History  ? ?Socioeconomic History  ? Marital status: Single  ?  Spouse name: Not on file  ? Number of children: Not on file  ? Years of education: Not  on file  ? Highest education level: Not on file  ?Occupational History  ? Not on file  ?Tobacco Use  ? Smoking status: Never  ? Smokeless tobacco: Never  ?Vaping Use  ? Vaping Use: Never used  ?Substance and Sexual Activity  ? Alcohol use: Yes  ?  Comment: occassionally  ? Drug use: No  ? Sexual activity: Not on file  ?Other Topics Concern  ? Not on file  ?Social History Narrative  ? 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 ?Social Determinants of Health  ? ?Financial  Resource Strain: Not on file  ?Food Insecurity: Not on file  ?Transportation Needs: Not on file  ?Physical Activity: Not on file  ?Stress: Not on file  ?Social Connections: Not on file  ?Intimate Partner Violence: Not on file  ? ? ?Medications:   ?Current Outpatient Medications on File Prior to Visit  ?Medication Sig Dispense Refill  ? apixaban (ELIQUIS) 5 MG TABS tablet Take 1 tablet (5 mg total) by mouth 2 (two) times daily. 60 tablet 1  ? atorvastatin (LIPITOR) 40 MG tablet Take 1 tablet (40 mg total) by mouth daily. 30 tablet 1  ? bisoprolol (ZEBETA) 5 MG tablet Take 1 tablet (5 mg total) by mouth daily. 90 tablet 3  ? spironolactone (ALDACTONE) 25 MG tablet Take 0.5 tablets (12.5 mg total) by mouth daily. 45 tablet 3  ? ?No current facility-administered medications on file prior to visit.  ? ? ?Allergies:  No Known Allergies ? ?Physical Exam ?General: well developed, well nourished pleasant tall middle-age Caucasian male, seated, in no evident distress ?Head: head normocephalic and atraumatic.   ?Neck: supple with no carotid or supraclavicular bruits ?Cardiovascular: regular rate and rhythm, no murmurs ?Musculoskeletal: no deformity ?Skin:  no rash/petichiae ?Vascular:  Normal pulses all extremities ? ?Neurologic Exam ?Mental Status: Awake and fully alert. Oriented to place and time. Recent and remote memory intact. Attention span, concentration and fund of knowledge appropriate. Mood and affect appropriate.  ?Cranial Nerves: Fundoscopic exam reveals sharp disc margins. Pupils equal, briskly reactive to light. Extraocular movements full without nystagmus. Visual fields full to confrontation. Hearing intact. Facial sensation intact. Face, tongue, palate moves normally and symmetrically.  ?Motor: Normal bulk and tone. Normal strength in all tested extremity muscles.  Diminished fine finger movements on the left.  Orbits right over left upper extremity. ?Sensory.: intact to touch , pinprick , position and  vibratory sensation.  Subjective paresthesias in the left hand but no objective sensory loss ?Coordination: Rapid alternating movements normal in all extremities. Finger-to-nose and heel-to-shin performed accurately bilaterally. ?Gait and Station: Arises from chair without difficulty. Stance is normal. Gait demonstrates normal stride length and balance . Able to heel, toe and tandem walk without difficulty.  ?Reflexes: 1+ and symmetric. Toes downgoing.  ? ?NIHSS  0 ?Modified Rankin  1 ? ? ?ASSESSMENT: 51 year old Caucasian male with right MCA infarct and March 2023 secondary to right M1 occlusion likely cardiogenic embolism from atrial flutter not on anticoagulation previously due to low CHADS2-VASc score of 1.  Vascular risk factors of cardiomyopathy, atrial flutter and mild hyperlipidemia ? ? ? ? ?PLAN: I had a long d/w patient and his wife about his recent embolic stroke , thrombolysis and mechanical thrombectomy, atrial flutter, risk for recurrent stroke/TIAs, personally independently reviewed imaging studies and stroke evaluation results and answered questions.Continue Eliquis (apixaban) 5 mg twice daily  for secondary stroke prevention and maintain strict control of hypertension with  blood pressure goal below 130/90, diabetes with hemoglobin A1c goal below 6.5% and lipids with LDL cholesterol goal below 70 mg/dL. I also advised the patient to eat a healthy diet with plenty of whole grains, cereals, fruits and vegetables, exercise regularly and maintain ideal body weight Followup in the future with me in 6 months or call earlier if necessary.  Greater than 50% time during this 45-minute consultation visit was spent on counseling and coordination of care and discussion about stroke prevention and treatment and answering questions. ?Antony Contras, MD ? ?Note: This document was prepared with digital dictation and possible smart phrase technology. Any transcriptional errors that result from this process are  unintentional. ? ? ?

## 2021-11-16 NOTE — Patient Instructions (Signed)
I had a long d/w patient and his wife about his recent embolic stroke , thrombolysis and mechanical thrombectomy, atrial flutter, risk for recurrent stroke/TIAs, personally independently reviewed imaging studies and stroke evaluation results and answered questions.Continue Eliquis (apixaban) 5 mg twice daily  for secondary stroke prevention and maintain strict control of hypertension with blood pressure goal below 130/90, diabetes with hemoglobin A1c goal below 6.5% and lipids with LDL cholesterol goal below 70 mg/dL. I also advised the patient to eat a healthy diet with plenty of whole grains, cereals, fruits and vegetables, exercise regularly and maintain ideal body weight Followup in the future with me in 6 months or call earlier if necessary. ?Stroke Prevention ?Some medical conditions and behaviors can lead to a higher chance of having a stroke. You can help prevent a stroke by eating healthy, exercising, not smoking, and managing any medical conditions you have. ?Stroke is a leading cause of functional impairment. Primary prevention is particularly important because a majority of strokes are first-time events. Stroke changes the lives of not only those who experience a stroke but also their family and other caregivers. ?How can this condition affect me? ?A stroke is a medical emergency and should be treated right away. A stroke can lead to brain damage and can sometimes be life-threatening. If a person gets medical treatment right away, there is a better chance of surviving and recovering from a stroke. ?What can increase my risk? ?The following medical conditions may increase your risk of a stroke: ?Cardiovascular disease. ?High blood pressure (hypertension). ?Diabetes. ?High cholesterol. ?Sickle cell disease. ?Blood clotting disorders (hypercoagulable state). ?Obesity. ?Sleep disorders (obstructive sleep apnea). ?Other risk factors include: ?Being older than age 10. ?Having a history of blood clots, stroke,  or mini-stroke (transient ischemic attack, TIA). ?Genetic factors, such as race, ethnicity, or a family history of stroke. ?Smoking cigarettes or using other tobacco products. ?Taking birth control pills, especially if you also use tobacco. ?Heavy use of alcohol or drugs, especially cocaine and methamphetamine. ?Physical inactivity. ?What actions can I take to prevent this? ?Manage your health conditions ?High cholesterol levels. ?Eating a healthy diet is important for preventing high cholesterol. If cholesterol cannot be managed through diet alone, you may need to take medicines. ?Take any prescribed medicines to control your cholesterol as told by your health care provider. ?Hypertension. ?To reduce your risk of stroke, try to keep your blood pressure below 130/80. ?Eating a healthy diet and exercising regularly are important for controlling blood pressure. If these steps are not enough to manage your blood pressure, you may need to take medicines. ?Take any prescribed medicines to control hypertension as told by your health care provider. ?Ask your health care provider if you should monitor your blood pressure at home. ?Have your blood pressure checked every year, even if your blood pressure is normal. Blood pressure increases with age and some medical conditions. ?Diabetes. ?Eating a healthy diet and exercising regularly are important parts of managing your blood sugar (glucose). If your blood sugar cannot be managed through diet and exercise, you may need to take medicines. ?Take any prescribed medicines to control your diabetes as told by your health care provider. ?Get evaluated for obstructive sleep apnea. Talk to your health care provider about getting a sleep evaluation if you snore a lot or have excessive sleepiness. ?Make sure that any other medical conditions you have, such as atrial fibrillation or atherosclerosis, are managed. ?Nutrition ?Follow instructions from your health care provider about what  to eat  or drink to help manage your health condition. These instructions may include: ?Reducing your daily calorie intake. ?Limiting how much salt (sodium) you use to 1,500 milligrams (mg) each day. ?Using only healthy fats for cooking, such as olive oil, canola oil, or sunflower oil. ?Eating healthy foods. You can do this by: ?Choosing foods that are high in fiber, such as whole grains, and fresh fruits and vegetables. ?Eating at least 5 servings of fruits and vegetables a day. Try to fill one-half of your plate with fruits and vegetables at each meal. ?Choosing lean protein foods, such as lean cuts of meat, poultry without skin, fish, tofu, beans, and nuts. ?Eating low-fat dairy products. ?Avoiding foods that are high in sodium. This can help lower blood pressure. ?Avoiding foods that have saturated fat, trans fat, and cholesterol. This can help prevent high cholesterol. ?Avoiding processed and prepared foods. ?Counting your daily carbohydrate intake. ? ?Lifestyle ?If you drink alcohol: ?Limit how much you have to: ?0-1 drink a day for women who are not pregnant. ?0-2 drinks a day for men. ?Know how much alcohol is in your drink. In the [place] one drink equals one 12 oz bottle of beer (333m), one 5 oz glass of wine (1452m, or one 1? oz glass of hard liquor (4488m ?Do not use any products that contain nicotine or tobacco. These products include cigarettes, chewing tobacco, and vaping devices, such as e-cigarettes. If you need help quitting, ask your health care provider. ?Avoid secondhand smoke. ?Do not use drugs. ?Activity ? ?Try to stay at a healthy weight. ?Get at least 30 minutes of exercise on most days, such as: ?Fast walking. ?Biking. ?Swimming. ?Medicines ?Take over-the-counter and prescription medicines only as told by your health care provider. Aspirin or blood thinners (antiplatelets or anticoagulants) may be recommended to reduce your risk of forming blood clots that can lead to stroke. ?Avoid taking  birth control pills. Talk to your health care provider about the risks of taking birth control pills if: ?You are over 35 33ars old. ?You smoke. ?You get very bad headaches. ?You have had a blood clot. ?Where to find more information ?American Stroke Association: www.strokeassociation.org ?Get help right away if: ?You or a loved one has any symptoms of a stroke. "BE FAST" is an easy way to remember the main warning signs of a stroke: ?B - Balance. Signs are dizziness, sudden trouble walking, or loss of balance. ?E - Eyes. Signs are trouble seeing or a sudden change in vision. ?F - Face. Signs are sudden weakness or numbness of the face, or the face or eyelid drooping on one side. ?A - Arms. Signs are weakness or numbness in an arm. This happens suddenly and usually on one side of the body. ?S - Speech. Signs are sudden trouble speaking, slurred speech, or trouble understanding what people say. ?T - Time. Time to call emergency services. Write down what time symptoms started. ?You or a loved one has other signs of a stroke, such as: ?A sudden, severe headache with no known cause. ?Nausea or vomiting. ?Seizure. ?These symptoms may represent a serious problem that is an emergency. Do not wait to see if the symptoms will go away. Get medical help right away. Call your local emergency services (911 in the [place] Do not drive yourself to the hospital. ?Summary ?You can help to prevent a stroke by eating healthy, exercising, not smoking, limiting alcohol intake, and managing any medical conditions you may have. ?Do not use any products that contain  nicotine or tobacco. These include cigarettes, chewing tobacco, and vaping devices, such as e-cigarettes. If you need help quitting, ask your health care provider. ?Remember "BE FAST" for warning signs of a stroke. Get help right away if you or a loved one has any of these signs. ?This information is not intended to replace advice given to you by your health care provider. Make  sure you discuss any questions you have with your health care provider. ?Document Revised: 02/01/2020 Document Reviewed: 02/01/2020 ?Elsevier Patient Education ? [place]. ? ? ?

## 2021-11-28 ENCOUNTER — Ambulatory Visit (HOSPITAL_COMMUNITY)
Admission: RE | Admit: 2021-11-28 | Discharge: 2021-11-28 | Disposition: A | Discharge status: Home / Self Care | Payer: BC Managed Care – PPO | Source: Ambulatory Visit | Attending: Cardiology | Admitting: Cardiology

## 2021-11-28 VITALS — BP 110/68 | HR 73 | Wt 246.2 lb

## 2021-11-28 DIAGNOSIS — I472 Ventricular tachycardia, unspecified: Secondary | ICD-10-CM | POA: Diagnosis not present

## 2021-11-28 DIAGNOSIS — Z8673 Personal history of transient ischemic attack (TIA), and cerebral infarction without residual deficits: Secondary | ICD-10-CM | POA: Diagnosis not present

## 2021-11-28 DIAGNOSIS — Z79899 Other long term (current) drug therapy: Secondary | ICD-10-CM | POA: Insufficient documentation

## 2021-11-28 DIAGNOSIS — I428 Other cardiomyopathies: Secondary | ICD-10-CM | POA: Diagnosis not present

## 2021-11-28 DIAGNOSIS — I5022 Chronic systolic (congestive) heart failure: Secondary | ICD-10-CM | POA: Insufficient documentation

## 2021-11-28 DIAGNOSIS — I252 Old myocardial infarction: Secondary | ICD-10-CM | POA: Insufficient documentation

## 2021-11-28 DIAGNOSIS — I4892 Unspecified atrial flutter: Secondary | ICD-10-CM | POA: Insufficient documentation

## 2021-11-28 DIAGNOSIS — Z7901 Long term (current) use of anticoagulants: Secondary | ICD-10-CM | POA: Diagnosis not present

## 2021-11-28 DIAGNOSIS — Z7984 Long term (current) use of oral hypoglycemic drugs: Secondary | ICD-10-CM | POA: Diagnosis not present

## 2021-11-28 DIAGNOSIS — Z950 Presence of cardiac pacemaker: Secondary | ICD-10-CM | POA: Insufficient documentation

## 2021-11-28 MED ORDER — BISOPROLOL FUMARATE 5 MG PO TABS: 2.5000 mg | ORAL_TABLET | Freq: Every day | ORAL | 3 refills | Status: DC

## 2021-11-28 MED ORDER — APIXABAN 5 MG PO TABS: 5.0000 mg | ORAL_TABLET | Freq: Two times a day (BID) | ORAL | 3 refills | Status: DC

## 2021-11-28 MED ORDER — ATORVASTATIN CALCIUM 40 MG PO TABS: 40.0000 mg | ORAL_TABLET | Freq: Every day | ORAL | 3 refills | Status: DC

## 2021-11-28 MED ORDER — DAPAGLIFLOZIN PROPANEDIOL 10 MG PO TABS: 10.0000 mg | ORAL_TABLET | Freq: Every day | ORAL | 11 refills | Status: DC

## 2021-11-28 MED ORDER — SPIRONOLACTONE 25 MG PO TABS: 25.0000 mg | ORAL_TABLET | Freq: Every day | ORAL | 3 refills | Status: DC

## 2021-11-28 NOTE — Patient Instructions (Signed)
It was a pleasure seeing you today! ? ?MEDICATIONS: ?-We are changing your medications today ?-Start Farxiga 10 mg (1 tablet) daily ?-Call if you have questions about your medications. ? ? ?NEXT APPOINTMENT: ?Return to clinic in 3 weeks with Dr. Aundra Dubin. ? ?In general, to take care of your heart failure: ?-Limit your fluid intake to 2 Liters (half-gallon) per day.   ?-Limit your salt intake to ideally 2-3 grams (2000-3000 mg) per day. ?-Weigh yourself daily and record, and bring that "weight diary" to your next appointment.  (Weight gain of 2-3 pounds in 1 day typically means fluid weight.) ?-The medications for your heart are to help your heart and help you live longer.   ?-Please contact us before stopping any of your heart medications. ? ?Call the clinic at [phone] with questions or to reschedule future appointments.  ?

## 2021-12-06 ENCOUNTER — Ambulatory Visit (INDEPENDENT_AMBULATORY_CARE_PROVIDER_SITE_OTHER): Payer: BC Managed Care – PPO

## 2021-12-06 DIAGNOSIS — I428 Other cardiomyopathies: Secondary | ICD-10-CM

## 2021-12-06 LAB — CUP PACEART REMOTE DEVICE CHECK
Battery Remaining Longevity: 36 mo
Battery Voltage: 2.96 V
Brady Statistic AP VP Percent: 98.07 %
Brady Statistic AP VS Percent: 0.01 %
Brady Statistic AS VP Percent: 1.83 %
Brady Statistic AS VS Percent: 0.09 %
Brady Statistic RA Percent Paced: 97.96 %
Brady Statistic RV Percent Paced: 98.54 %
Date Time Interrogation Session: 20230524012305
HighPow Impedance: 74 Ohm
Implantable Lead Implant Date: 20200224
Implantable Lead Implant Date: 20200224
Implantable Lead Implant Date: 20200224
Implantable Lead Location: 753858
Implantable Lead Location: 753859
Implantable Lead Location: 753860
Implantable Lead Model: 5076
Implantable Lead Model: 6935
Implantable Pulse Generator Implant Date: 20200224
Lead Channel Impedance Value: 235.98 Ohm
Lead Channel Impedance Value: 239.922
Lead Channel Impedance Value: 239.922
Lead Channel Impedance Value: 261.164
Lead Channel Impedance Value: 261.164
Lead Channel Impedance Value: 304 Ohm
Lead Channel Impedance Value: 399 Ohm
Lead Channel Impedance Value: 437 Ohm
Lead Channel Impedance Value: 437 Ohm
Lead Channel Impedance Value: 513 Ohm
Lead Channel Impedance Value: 532 Ohm
Lead Channel Impedance Value: 532 Ohm
Lead Channel Impedance Value: 836 Ohm
Lead Channel Impedance Value: 855 Ohm
Lead Channel Impedance Value: 893 Ohm
Lead Channel Impedance Value: 950 Ohm
Lead Channel Impedance Value: 969 Ohm
Lead Channel Impedance Value: 988 Ohm
Lead Channel Pacing Threshold Amplitude: 0.875 V
Lead Channel Pacing Threshold Amplitude: 1 V
Lead Channel Pacing Threshold Pulse Width: 0.4 ms
Lead Channel Pacing Threshold Pulse Width: 0.4 ms
Lead Channel Sensing Intrinsic Amplitude: 1.875 mV
Lead Channel Sensing Intrinsic Amplitude: 1.875 mV
Lead Channel Sensing Intrinsic Amplitude: 19.125 mV
Lead Channel Sensing Intrinsic Amplitude: 19.125 mV
Lead Channel Setting Pacing Amplitude: 1.75 V
Lead Channel Setting Pacing Amplitude: 2.5 V
Lead Channel Setting Pacing Amplitude: 2.5 V
Lead Channel Setting Pacing Pulse Width: 0.4 ms
Lead Channel Setting Pacing Pulse Width: 0.6 ms
Lead Channel Setting Sensing Sensitivity: 0.3 mV

## 2021-12-12 ENCOUNTER — Inpatient Hospital Stay: Payer: BC Managed Care – PPO | Admitting: Neurology

## 2021-12-19 NOTE — Progress Notes (Signed)
Remote ICD transmission.   

## 2021-12-21 ENCOUNTER — Encounter: Payer: Self-pay | Admitting: Cardiology

## 2021-12-21 ENCOUNTER — Ambulatory Visit (HOSPITAL_COMMUNITY)
Admission: RE | Admit: 2021-12-21 | Discharge: 2021-12-21 | Disposition: A | Discharge status: Home / Self Care | Payer: BC Managed Care – PPO | Source: Ambulatory Visit | Attending: Cardiology | Admitting: Cardiology

## 2021-12-21 ENCOUNTER — Encounter (HOSPITAL_COMMUNITY): Payer: Self-pay | Admitting: Cardiology

## 2021-12-21 VITALS — BP 110/68 | HR 72 | Wt 241.0 lb

## 2021-12-21 DIAGNOSIS — Z8673 Personal history of transient ischemic attack (TIA), and cerebral infarction without residual deficits: Secondary | ICD-10-CM | POA: Diagnosis not present

## 2021-12-21 DIAGNOSIS — I442 Atrioventricular block, complete: Secondary | ICD-10-CM | POA: Diagnosis not present

## 2021-12-21 DIAGNOSIS — Z7984 Long term (current) use of oral hypoglycemic drugs: Secondary | ICD-10-CM | POA: Insufficient documentation

## 2021-12-21 DIAGNOSIS — I4892 Unspecified atrial flutter: Secondary | ICD-10-CM | POA: Insufficient documentation

## 2021-12-21 DIAGNOSIS — Z95810 Presence of automatic (implantable) cardiac defibrillator: Secondary | ICD-10-CM | POA: Diagnosis not present

## 2021-12-21 DIAGNOSIS — Z79899 Other long term (current) drug therapy: Secondary | ICD-10-CM | POA: Diagnosis not present

## 2021-12-21 DIAGNOSIS — Z7901 Long term (current) use of anticoagulants: Secondary | ICD-10-CM | POA: Insufficient documentation

## 2021-12-21 DIAGNOSIS — I428 Other cardiomyopathies: Secondary | ICD-10-CM | POA: Diagnosis not present

## 2021-12-21 DIAGNOSIS — I5022 Chronic systolic (congestive) heart failure: Secondary | ICD-10-CM | POA: Diagnosis not present

## 2021-12-21 DIAGNOSIS — I110 Hypertensive heart disease with heart failure: Secondary | ICD-10-CM | POA: Diagnosis not present

## 2021-12-21 DIAGNOSIS — Z8249 Family history of ischemic heart disease and other diseases of the circulatory system: Secondary | ICD-10-CM | POA: Diagnosis not present

## 2021-12-21 LAB — CBC
HCT: 43.9 % (ref 39.0–52.0)
Hemoglobin: 15 g/dL (ref 13.0–17.0)
MCH: 30.1 pg (ref 26.0–34.0)
MCHC: 34.2 g/dL (ref 30.0–36.0)
MCV: 88.2 fL (ref 80.0–100.0)
Platelets: 185 10*3/uL (ref 150–400)
RBC: 4.98 MIL/uL (ref 4.22–5.81)
RDW: 12.4 % (ref 11.5–15.5)
WBC: 5.8 10*3/uL (ref 4.0–10.5)
nRBC: 0 % (ref 0.0–0.2)

## 2021-12-21 LAB — BASIC METABOLIC PANEL
Anion gap: 7 (ref 5–15)
BUN: 17 mg/dL (ref 6–20)
CO2: 26 mmol/L (ref 22–32)
Calcium: 9.5 mg/dL (ref 8.9–10.3)
Chloride: 106 mmol/L (ref 98–111)
Creatinine, Ser: 1.1 mg/dL (ref 0.61–1.24)
GFR, Estimated: >60 mL/min (ref >60)
Glucose, Bld: 85 mg/dL (ref 70–99)
Potassium: 4.2 mmol/L (ref 3.5–5.1)
Sodium: 139 mmol/L (ref 135–145)

## 2021-12-21 LAB — BRAIN NATRIURETIC PEPTIDE: B Natriuretic Peptide: 90.9 pg/mL (ref 0.0–100.0)

## 2021-12-21 MED ORDER — BISOPROLOL FUMARATE 5 MG PO TABS: 2.5000 mg | ORAL_TABLET | Freq: Every day | ORAL | Status: DC

## 2021-12-21 MED ORDER — VALSARTAN 40 MG PO TABS: 20.0000 mg | ORAL_TABLET | Freq: Every day | ORAL | 3 refills | Status: DC

## 2021-12-21 NOTE — Progress Notes (Signed)
Height:     Weight: BMI:  Today's Date:  STOP BANG RISK ASSESSMENT S (snore) Have you been told that you snore?     YES   T (tired) Are you often tired, fatigued, or sleepy during the day?   YES  O (obstruction) Do you stop breathing, choke, or gasp during sleep? NO   P (pressure) Do you have or are you being treated for high blood pressure? YES   B (BMI) Is your body index greater than 35 kg/m? NO   A (age) Are you 53 years old or older? YES   N (neck) Do you have a neck circumference greater than 16 inches?   NO   G (gender) Are you a male? YES   TOTAL STOP/BANG "YES" ANSWERS 5                                                                       For Office Use Only              Procedure Order Form    YES to 3+ Stop Bang questions OR two clinical symptoms - patient qualifies for WatchPAT (CPT 95800)      Clinical Notes: Will consult Sleep Specialist and refer for management of therapy due to patient increased risk of Sleep Apnea. Ordering a sleep study due to the following two clinical symptoms: Excessive daytime sleepiness G47.10 /  Morning Headaches G44.221 / Difficulty concentrating R41.840 / Memory problems or poor judgment G31.84 / Personality changes or irritability R45.4 / Loud snoring R06.83 / Unrefreshed by sleep G47.8  History of high blood pressure R03.0 / Insomnia G47.00

## 2021-12-21 NOTE — Progress Notes (Signed)
Primary Care: Patient, No Pcp Per (Inactive) HF Cardiologist: Dr. Aundra Dubin EP: Dr. Caryl Comes  HPI: 51 y.o. male w/ chronic systolic heart failure, CHB and VT s/p Medtronic CRT-D, atrial flutter and CVA.   He has a strong family history of cardiomyopathy.  Uncle with heart transplant, 2 cousins with pacemakers. His mother has had genetic testing and was LMNA gene mutation positive.  He was initially followed by Dr. Rayann Heman and Dr. Acie Fredrickson for atrial flutter and bradycardia. Declined atrial flutter ablation in the past. 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 Subsequently was referred for cMRI and PET scan in 2019. cMRI showed non-coronary pattern LGE suspicious for infiltrative disease. PET scan at [facility] showed abnormal FDG uptake consistent with inflamatory process of the myocardium, most especially involving the basal inferior, basal inferoseptal and mid inferoseptal walls. This was also concerning for possible sarcoid; however he had chest CT that showed no pulmonary evidence for sarcoid. Given no definitive diagnosis, pt declined empiric immunosuppressive treatment, but did undergo Medtronic CRT-D placement in 2/20 by Dr. Lovena Le. He has since been followed primarily by Dr. Caryl Comes.   Echo 01/2014 EF 50-55%, RV normal  Echo 11/2016 EF 40-45%, RV normal  Echo 04/2018 EF 40-45%, RV normal  Echo 02/2019 EF 50-55%, RV normal  Echo 07/2021 showed mildly reduced LVEF, 45-50%, normal RV. Moderate BAE.   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 Unfortunately, had not been on anticoagulation prior to this. He underwent successful mechanical thrombectomy by IR. Patient 's symptoms improved rapidly.  Cardiology was consulted as TTE showed EF of 30-35%, mild RV dilation, severe biatrial enlargement.  He was placed on limited medical therapy to allow for permissive hypertension following  stroke. Started on Coreg 3.15 mg bid. Eliquis was started for atrial flutter. Atorvastatin added (LDL 100 mg/dL). Hgb A1c 6.0. Baseline SCr < 1.0. Discharged home on 09/28/21. Referred to HF clinic.   When I saw him initially, I obtained genetic testing.  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   He returns today for followup of CHF.  He is not getting short of breath with exertion.  He is swimming, using the elliptical, and lifting weights for exercise.  However, he reports that he feels sluggish and fatigued most of the time.  He is tired during the day.  SBP runs around 110 at home now, Delene Loll was stopped as it was dropping his BP low and he was getting lightheaded.  He is no longer getting lightheaded. He does not feel palpitations.   ECG (personally reviewed): A-BiV paced   MDT device interrogation: Stable thoracic impedance, >99% BiV pacing, rare AF/AFL.   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  Medtronic CRT-D device.  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  - Echo 01/2014 EF 50-55%, RV normal  - Echo 11/2016 EF 40-45%, RV normal  - Echo 04/2018 EF 40-45%, RV normal  - Cardiac MRI (11/19): LV EF 44%, RV EF 62%, patchy mid-wall LGE in the basal septal and lateral walls.  - CPET (12/19, [facility]): abnormal FDG uptake consistent with inflammatory process of the myocardium. More especially involving the basal-inferior, Basal inferoseptal and mid inferoseptal walls. In  the proper clinical settings this may represent active sarcoidosis in the myocardium. - Echo 02/2019 EF 50-55%, RV normal  - Echo 07/2021 showed mildly reduced LVEF, 45-50%, normal RV. Moderate BAE.  - Echo 3/23 EF 30-35%, mild RV dilation, severe biatrial enlargement. 2. 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   3. Atrial flutter: Paroxysmal.  4. 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  5. H/o VT  Review of Systems: All systems reviewed and negative except as per HPI.   Current Outpatient Medications  Medication Sig Dispense Refill   apixaban (ELIQUIS) 5 MG TABS tablet Take 1 tablet (5 mg total) by mouth 2 (two) times daily. 180 tablet 3   atorvastatin (LIPITOR) 40 MG tablet Take 1 tablet (40 mg total) by mouth daily. 90 tablet 3   dapagliflozin propanediol (FARXIGA) 10 MG TABS tablet Take 1 tablet (10 mg total) by mouth daily before breakfast. 30 tablet 11   spironolactone (ALDACTONE) 25 MG tablet Take 1 tablet (25 mg total) by mouth daily. 90 tablet 3   valsartan (DIOVAN) 40 MG tablet Take 0.5 tablets (20 mg total) by mouth daily. 45 tablet 3   bisoprolol (ZEBETA) 5 MG tablet Take 0.5 tablets (2.5 mg total) by mouth daily.     No current facility-administered medications for this encounter.    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[employer], previously an Arboriculturist   Social Determinants of Radio broadcast assistant Strain: Not on Art therapist Insecurity: Not on file  Transportation Needs: Not on file  Physical Activity: Not on file  Stress: Not on file  Social Connections: Not on file  Intimate Partner Violence: Not on file      Family History  Problem  Relation Age of Onset   Asthma Mother    Cancer Maternal Grandmother    Cancer Maternal Grandfather    Cancer Paternal Grandmother    Heart failure Maternal Uncle    Bradycardia Other        multiple family members with pacemakers   Heart failure Maternal Uncle s/p Cardiac Transplant   Maternal Cousin s/p Cardiac Transplant       Vitals:   12/21/21 1532  BP: 110/68  Pulse: 72  SpO2: 96%  Weight: 109.3 kg (241 lb)    PHYSICAL EXAM: General: NAD Neck: No JVD, no thyromegaly or thyroid nodule.  Lungs: Clear to auscultation bilaterally with normal respiratory effort. CV: Nondisplaced PMI.  Heart regular S1/S2, no S3/S4, no murmur.  No peripheral edema.  No carotid bruit.  Normal pedal pulses.  Abdomen: Soft, nontender, no hepatosplenomegaly, no distention.  Skin: Intact without lesions or rashes.  Neurologic: Alert and oriented x 3.  Psych: Normal affect. Extremities: No clubbing  or cyanosis.  HEENT: Normal.   ASSESSMENT & PLAN:  1. 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  Patient has had DCM associated with CHB and with atrial flutter.  Strong family history of cardiomyopathy and conduction disease/pacemaker placement.  Patient's mother also has LMNA gene mutation.  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 Patient has MDT CRT-D.  cMRI in 11/19 showed LV EF 44%, RV EF 62%, patchy mid-wall LGE in the basal septal and lateral walls.  cPET in 12/19 showed abnormal uptake in myocardium concerning for inflammatory process.  There was initial concern for cardiac sarcoidosis as a unifying diagnosis, but no sign of pulmonary sarcoidosis on CT chest and patient is positive for LMNA gene mutation.  The cMRI and cPET patterns that the patient had can be seen with genetic cardiomyopathies, often appearing to masquerade as  cardiac sarcoidosis.  I think cardiac sarcoidosis is unlikely and would not immunosuppress.  Most recent echo in 3/23 showed EF 30-35%, mild RV dilation. NYHA class II symptoms with prominent fatigue.  Not volume overloaded by exam or Optivol.  - ?If bisoprolol is contributing to fatigue.  I will decrease it to 2.5 mg daily.  - Off Entresto due to orthostasis.  Start valsartan 20 mg qhs. BMET/BNP today, BMET in 10 days.  - Continue spironolactone 25 mg daily.  - Continue Farxiga 10 mg daily.   - I referred to Dr. Broadus John for genetic counseling.  2. Paroxysmal Atrial Flutter: Dates back to at least 2012, previously offered AFL ablation but declined.  Recently, only rare AFL on device interrogation.   - Long-term Eliquis with CVA history.  - I think atrial flutter ablation would be reasonable.  He saw Dr. Caryl Comes, and it appears that ablation was deferred for the time being due to rare AFL currently.   3. Rt MCA CVA: Recent MCA infarct due to right M1 occlusion 3/23. Likely secondary to atrial flutter and cardiomyopathy (LVEF 30-35%). Had not been anticoagulated. s/p mechanical thrombectomy by IR - Continue Eliquis 5 mg bid.  - Continue statin.  4. CHB: MDT CRT-D.  >99% BiV paced on device interrogation.  - concern for LMNA CMP as outlined above  5. H/o VT: Has ICD, followed by Dr. Caryl Comes  - no recent VT on device interrogation  6. Suspect OSA: He has a home sleep study and needs to use it.  Some of his fatigue may be related to OSA.   Followup 2 months.   Loralie Champagne 12/21/2021

## 2021-12-21 NOTE — Patient Instructions (Signed)
Medication Changes:  Decrease Bisoprolol to 2.'5mg'$  daily  Lab Work:  Labs done today, your results will be available in MyChart, we will contact you for abnormal readings.   Testing/Procedures:  Repeat blood work in 10 days  Your provider has recommended that you have a home sleep study.  We have provided you with the equipment in our office today. Please download the app and follow the instructions. YOUR PIN NUMBER IS: [id number]. Once you have completed the test you just dispose of the equipment, the information is automatically uploaded to [place] via blue-tooth technology. If your test is positive for sleep apnea and you need a home CPAP machine you will be contacted by Dr Theodosia Blender office [facility] to set this up.   Referrals:  none  Special Instructions // Education:  PLEASE COMPLETE YOUR SLEEP STUDY  Follow-Up in: 2 months   At the [facility], you and your health needs are our priority. We have a designated team specialized in the treatment of Heart Failure. This Care Team includes your primary Heart Failure Specialized Cardiologist (physician), Advanced Practice Providers (APPs- Physician Assistants and Nurse Practitioners), and Pharmacist who all work together to provide you with the care you need, when you need it.   You may see any of the following providers on your designated Care Team at your next follow up:  Dr Glori Bickers Dr Haynes Kerns, NP Lyda Jester, [place] [facility] [place], [place] Audry Riles, PharmD   Please be sure to bring in all your medications bottles to every appointment.   Need to Contact us:  If you have any questions or concerns before your next appointment please send [place] a message through [place] or call our office at [phone].    TO LEAVE A MESSAGE FOR THE NURSE SELECT OPTION 2, PLEASE LEAVE A MESSAGE INCLUDING: YOUR NAME DATE OF BIRTH CALL BACK NUMBER REASON FOR CALL**this is important as  we prioritize the call backs  YOU WILL RECEIVE A CALL BACK THE SAME DAY AS LONG AS YOU CALL BEFORE 4:00 PM

## 2021-12-22 ENCOUNTER — Telehealth (HOSPITAL_COMMUNITY): Payer: Self-pay | Admitting: Surgery

## 2021-12-22 NOTE — Telephone Encounter (Signed)
I called patient to remind him to perform pending home sleep study.  I left a message to indicate the reminder.

## 2021-12-25 NOTE — Progress Notes (Deleted)
Electrophysiology Office Note Date: 12/25/2021  ID:  SYMEON PULEO, DOB [birth year], MRN [id number]  PCP: Patient, No Pcp Per (Inactive) Primary Cardiologist: Mertie Moores, MD Electrophysiologist: Virl Axe, MD   CC: Routine ICD follow-up  Ricardo Jones is a 51 y.o. male seen today for Virl Axe, MD for routine electrophysiology followup.  Since last being seen in our clinic the patient reports doing ***.  he denies chest pain, palpitations, dyspnea, PND, orthopnea, nausea, vomiting, dizziness, syncope, edema, weight gain, or early satiety. {He/she (caps):30048} has not had ICD shocks.   Device History: MDT CRT-D implanted 09/08/2018 ABBOTT LV lead  Past Medical History:  Diagnosis Date   Acute right MCA stroke ([place]) 09/25/2021   s/p thrombectomy   Bradycardia    asymptomatic   Chronic systolic CHF (congestive heart failure), NYHA class 2 (HCC)    Complete heart block (HCC)    s/p CRT-D   Hyperlipidemia LDL goal <100    S/P dilatation of esophageal stricture    Typical atrial flutter [facility])    Past Surgical History:  Procedure Laterality Date   BIV ICD INSERTION CRT-D N/A 09/08/2018   Procedure: BIV ICD INSERTION CRT-D;  Surgeon: Evans Lance, MD;  Location: [place] CV LAB;  Service: Cardiovascular;  Laterality: N/A;   IR CT HEAD LTD  09/25/2021   IR PERCUTANEOUS ART THROMBECTOMY/INFUSION INTRACRANIAL INC DIAG ANGIO  09/25/2021   RADIOLOGY WITH ANESTHESIA N/A 09/25/2021   Procedure: IR WITH ANESTHESIA;  Surgeon: Luanne Bras, MD;  Location: [place];  Service: Radiology;  Laterality: N/A;   THROAT SURGERY  2012    Current Outpatient Medications  Medication Sig Dispense Refill   apixaban (ELIQUIS) 5 MG TABS tablet Take 1 tablet (5 mg total) by mouth 2 (two) times daily. 180 tablet 3   atorvastatin (LIPITOR) 40 MG tablet Take 1 tablet (40 mg total) by mouth daily. 90 tablet 3   bisoprolol (ZEBETA) 5 MG tablet Take 0.5 tablets (2.5 mg total) by mouth daily.      dapagliflozin propanediol (FARXIGA) 10 MG TABS tablet Take 1 tablet (10 mg total) by mouth daily before breakfast. 30 tablet 11   spironolactone (ALDACTONE) 25 MG tablet Take 1 tablet (25 mg total) by mouth daily. 90 tablet 3   valsartan (DIOVAN) 40 MG tablet Take 0.5 tablets (20 mg total) by mouth daily. 45 tablet 3   No current facility-administered medications for this visit.    Allergies:   Patient has no known allergies.   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[employer], previously an Arboriculturist   Social Determinants of Radio broadcast assistant Strain: Not on file  Food Insecurity: Not on file  Transportation Needs: Not on file  Physical Activity: Not on file  Stress: Not on file  Social Connections:  Not on file  Intimate Partner Violence: Not on file    Family History: Family History  Problem Relation Age of Onset   Asthma Mother    Cancer Maternal Grandmother    Cancer Maternal Grandfather    Cancer Paternal Grandmother    Heart failure Maternal Uncle    Bradycardia Other        multiple family members with pacemakers   Heart failure Other     Review of Systems: All other systems reviewed and are otherwise negative except as noted above.   Physical Exam: There were no vitals filed for this visit.   GEN- The patient is well appearing, alert and oriented x 3 today.   HEENT: normocephalic, atraumatic; sclera  clear, conjunctiva pink; hearing intact; oropharynx clear; neck supple, no JVP Lymph- no cervical lymphadenopathy Lungs- Clear to ausculation bilaterally, normal work of breathing.  No wheezes, rales, rhonchi Heart- Regular rate and rhythm, no murmurs, rubs or gallops, PMI not laterally displaced GI- soft, non-tender, non-distended, bowel sounds present, no hepatosplenomegaly Extremities- no clubbing or cyanosis. No edema; DP/PT/radial pulses 2+ bilaterally MS- no significant deformity or atrophy Skin- warm and dry, no rash or lesion; ICD pocket well healed Psych- euthymic mood, full affect Neuro- strength and sensation are intact  ICD interrogation- reviewed in detail today,  See PACEART report  EKG:  EKG is not ordered today. Personal review of EKG ordered {Blank single:19197::"today","***"} shows ***  Recent Labs: 09/25/2021: ALT 25 09/28/2021: Magnesium 1.9 12/21/2021: B Natriuretic Peptide 90.9; BUN 17; Creatinine, Ser 1.10; Hemoglobin 15.0; Platelets 185; Potassium 4.2; Sodium 139   Wt Readings from Last 3 Encounters:  12/21/21 241 lb (109.3 kg)  11/28/21 246 lb 3.2 oz (111.7 kg)  11/16/21 248 lb (112.5 kg)     Other studies Reviewed: Additional studies/ records that were reviewed today include: Previous EP office notes.   Assessment and Plan:  1.  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  Patient has had DCM associated with CHB and with atrial flutter.   euvolemic today Stable on an appropriate medical regimen Normal ICD function See Pace Art report No changes today LV vector testing produced diaphragmatic stim also quite symptomatic Baseline is LV2-3, LV-RV with zero offset LV> RV offset best at zero, getting longer with longer offset, LV only also longer QRS, and AV delays are checked, none making a significant impact The best was a small  improvement from 145m > 16109mon his QRS by shortening hsi AV delays to PAV 110 and SAV 90  2. Atrial flutter Low burden overall.  Continue eliquis for CHA2DS2VASc of at least 3.   Current medicines are reviewed at length with the patient today.   =  Labs/ tests ordered today include: *** No orders of the defined types were placed in this encounter.    Disposition:   Follow up with {Blank single:19197::"Dr. Allred","Dr. TaArlan OrganKlein","Dr. Camnitz","Dr. Lambert","EP APP"} in {Blank single:19197::"2 weeks","4 weeks","3 months","6 months","12 months","as usual post gen change"}    Signed, MiAnnamaria Helling6/06/2022 9:21 AM  [facility] [address] [phone]) [phone])

## 2021-12-29 ENCOUNTER — Encounter: Payer: BC Managed Care – PPO | Admitting: Internal Medicine

## 2022-01-01 ENCOUNTER — Encounter: Payer: BC Managed Care – PPO | Admitting: Student

## 2022-01-02 ENCOUNTER — Ambulatory Visit (HOSPITAL_COMMUNITY)
Admission: RE | Admit: 2022-01-02 | Discharge: 2022-01-02 | Disposition: A | Discharge status: Home / Self Care | Payer: BC Managed Care – PPO | Source: Ambulatory Visit | Attending: Internal Medicine | Admitting: Internal Medicine

## 2022-01-02 DIAGNOSIS — I5022 Chronic systolic (congestive) heart failure: Secondary | ICD-10-CM | POA: Insufficient documentation

## 2022-01-02 LAB — BASIC METABOLIC PANEL
Anion gap: 6 (ref 5–15)
BUN: 15 mg/dL (ref 6–20)
CO2: 28 mmol/L (ref 22–32)
Calcium: 9.8 mg/dL (ref 8.9–10.3)
Chloride: 105 mmol/L (ref 98–111)
Creatinine, Ser: 1.17 mg/dL (ref 0.61–1.24)
GFR, Estimated: >60 mL/min (ref >60)
Glucose, Bld: 96 mg/dL (ref 70–99)
Potassium: 4.6 mmol/L (ref 3.5–5.1)
Sodium: 139 mmol/L (ref 135–145)

## 2022-01-05 ENCOUNTER — Other Ambulatory Visit: Payer: Self-pay

## 2022-01-09 ENCOUNTER — Other Ambulatory Visit: Payer: Self-pay

## 2022-01-09 ENCOUNTER — Other Ambulatory Visit (HOSPITAL_COMMUNITY): Payer: Self-pay | Admitting: Cardiology

## 2022-01-11 ENCOUNTER — Other Ambulatory Visit: Payer: Self-pay

## 2022-01-11 NOTE — Patient Outreach (Signed)
[facility]) Care Management  01/11/2022  Ricardo Jones 1970/10/07 [id number]   3 outreach attempts were completed to obtain mRs. mRs could not be obtained because patient never returned my calls. mRs=7    [place] Management Assistant [phone]

## 2022-01-19 ENCOUNTER — Telehealth (HOSPITAL_COMMUNITY): Payer: Self-pay | Admitting: Surgery

## 2022-01-19 NOTE — Telephone Encounter (Signed)
I called patient to again remind him to perform the ordered home sleep study.  I left a message to request that he do the study within the next several nights or return the device to our clinic.

## 2022-02-27 ENCOUNTER — Encounter (HOSPITAL_COMMUNITY): Payer: Self-pay | Admitting: Cardiology

## 2022-02-27 ENCOUNTER — Ambulatory Visit (HOSPITAL_COMMUNITY)
Admission: RE | Admit: 2022-02-27 | Discharge: 2022-02-27 | Disposition: A | Discharge status: Home / Self Care | Payer: BC Managed Care – PPO | Source: Ambulatory Visit | Attending: Cardiology | Admitting: Cardiology

## 2022-02-27 VITALS — BP 94/50 | HR 71 | Wt 229.6 lb

## 2022-02-27 DIAGNOSIS — Z7901 Long term (current) use of anticoagulants: Secondary | ICD-10-CM | POA: Diagnosis not present

## 2022-02-27 DIAGNOSIS — Z8673 Personal history of transient ischemic attack (TIA), and cerebral infarction without residual deficits: Secondary | ICD-10-CM | POA: Diagnosis not present

## 2022-02-27 DIAGNOSIS — I5022 Chronic systolic (congestive) heart failure: Secondary | ICD-10-CM | POA: Diagnosis present

## 2022-02-27 DIAGNOSIS — Z7984 Long term (current) use of oral hypoglycemic drugs: Secondary | ICD-10-CM | POA: Diagnosis not present

## 2022-02-27 DIAGNOSIS — Z79899 Other long term (current) drug therapy: Secondary | ICD-10-CM | POA: Insufficient documentation

## 2022-02-27 DIAGNOSIS — I428 Other cardiomyopathies: Secondary | ICD-10-CM | POA: Diagnosis not present

## 2022-02-27 DIAGNOSIS — Z8249 Family history of ischemic heart disease and other diseases of the circulatory system: Secondary | ICD-10-CM | POA: Insufficient documentation

## 2022-02-27 DIAGNOSIS — I4892 Unspecified atrial flutter: Secondary | ICD-10-CM | POA: Insufficient documentation

## 2022-02-27 DIAGNOSIS — Z95810 Presence of automatic (implantable) cardiac defibrillator: Secondary | ICD-10-CM | POA: Insufficient documentation

## 2022-02-27 LAB — BASIC METABOLIC PANEL
Anion gap: 7 (ref 5–15)
BUN: 18 mg/dL (ref 6–20)
CO2: 25 mmol/L (ref 22–32)
Calcium: 9.6 mg/dL (ref 8.9–10.3)
Chloride: 105 mmol/L (ref 98–111)
Creatinine, Ser: 1.01 mg/dL (ref 0.61–1.24)
GFR, Estimated: >60 mL/min (ref >60)
Glucose, Bld: 95 mg/dL (ref 70–99)
Potassium: 4.2 mmol/L (ref 3.5–5.1)
Sodium: 137 mmol/L (ref 135–145)

## 2022-02-27 LAB — BRAIN NATRIURETIC PEPTIDE: B Natriuretic Peptide: 66.3 pg/mL (ref 0.0–100.0)

## 2022-02-27 NOTE — Patient Instructions (Signed)
Please retry Valsartan '20mg'$  at night time   PLEASE LET us KNOW EITHER BY PHONE OF MY CHART IF YOU ARE TAKING BISOPROLOL  Labs done today, your results will be available in MyChart, we will contact you for abnormal readings.  Your physician has recommended that you have a cardiopulmonary stress test (CPX). CPX testing is a non-invasive measurement of heart and lung function. It replaces a traditional treadmill stress test. This type of test provides a tremendous amount of information that relates not only to your present condition but also for future outcomes. This test combines measurements of you ventilation, respiratory gas exchange in the lungs, electrocardiogram (EKG), blood pressure and physical response before, during, and following an exercise protocol.  You are been referred to Dr. Melvyn Novas at [facility].  Your physician recommends that you schedule a follow-up appointment in: 3 months   If you have any questions or concerns before your next appointment please send [place] a message through [place] or call our office at [phone].    TO LEAVE A MESSAGE FOR THE NURSE SELECT OPTION 2, PLEASE LEAVE A MESSAGE INCLUDING: YOUR NAME DATE OF BIRTH CALL BACK NUMBER REASON FOR CALL**this is important as we prioritize the call backs  YOU WILL RECEIVE A CALL BACK THE SAME DAY AS LONG AS YOU CALL BEFORE 4:00 PM  At the [facility], you and your health needs are our priority. As part of our continuing mission to provide you with exceptional heart care, we have created designated Provider Care Teams. These Care Teams include your primary Cardiologist (physician) and Advanced Practice Providers (APPs- Physician Assistants and Nurse Practitioners) who all work together to provide you with the care you need, when you need it.   You may see any of the following providers on your designated Care Team at your next follow up: Dr Glori Bickers Dr Haynes Kerns, NP Lyda Jester, [place] [facility] [place], [place] Audry Riles, PharmD   Please be sure to bring in all your medications bottles to every appointment.

## 2022-02-27 NOTE — Progress Notes (Signed)
Primary Care: Patient, No Pcp Per HF Cardiologist: Dr. Aundra Dubin EP: Dr. Caryl Comes  HPI: 51 y.o. male w/ chronic systolic heart failure, CHB and VT s/p Medtronic CRT-D, atrial flutter and CVA.   He has a strong family history of cardiomyopathy.  Uncle with heart transplant, 2 cousins with pacemakers. His mother has had genetic testing and was LMNA gene mutation positive.  He was initially followed by Dr. Rayann Heman and Dr. Acie Fredrickson for atrial flutter and bradycardia. Declined atrial flutter ablation in the past. 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 Subsequently was referred for cMRI and PET scan in 2019. cMRI showed non-coronary pattern LGE suspicious for infiltrative disease. PET scan at [facility] showed abnormal FDG uptake consistent with inflamatory process of the myocardium, most especially involving the basal inferior, basal inferoseptal and mid inferoseptal walls. This was also concerning for possible sarcoid; however he had chest CT that showed no pulmonary evidence for sarcoid. Given no definitive diagnosis, pt declined empiric immunosuppressive treatment, but did undergo Medtronic CRT-D placement in 2/20 by Dr. Lovena Le. He has since been followed primarily by Dr. Caryl Comes.   Echo 01/2014 EF 50-55%, RV normal  Echo 11/2016 EF 40-45%, RV normal  Echo 04/2018 EF 40-45%, RV normal  Echo 02/2019 EF 50-55%, RV normal  Echo 07/2021 showed mildly reduced LVEF, 45-50%, normal RV. Moderate BAE.   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 Unfortunately, had not been on anticoagulation prior to this. He underwent successful mechanical thrombectomy by IR. Patient 's symptoms improved rapidly.  Cardiology was consulted as TTE showed EF of 30-35%, mild RV dilation, severe biatrial enlargement.  He was placed on limited medical therapy to allow for permissive hypertension following stroke.  Started on Coreg 3.15 mg bid. Eliquis was started for atrial flutter. Atorvastatin added (LDL 100 mg/dL). Hgb A1c 6.0. Baseline SCr < 1.0. Discharged home on 09/28/21. Referred to HF clinic.   When I saw him initially, I obtained genetic testing.  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   He returns today for followup of CHF.  He stopped valsartan due to fatigue and lightheadedness.  He is not sure whether or not he is taking bisoprolol but thinks he most likely is.  Weight is down 12 lbs with diet/exercise.  Generally feels good, no dyspnea except with 2 flights of stairs.  Does not have the stamina that he had in the past.  He swims and uses the elliptical for exercise.  Noted dyspnea walking up hills on recent trip to [place].  No longer snoring since he has lost weight.  SBP now generally in the 90s-100s.  He will feel palpitations when he goes into atrial fibrillation.  His 3 year old cousin recently had a heart transplant.   MDT device interrogation: Stable thoracic impedance, >98.6% BiV pacing, 11.2% atrial fibrillation.   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  Medtronic CRT-D device.  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  - Echo 01/2014 EF 50-55%, RV normal  - Echo 11/2016 EF 40-45%, RV normal  - Echo 04/2018 EF 40-45%, RV normal  - Cardiac MRI (11/19): LV EF 44%,  RV EF 62%, patchy mid-wall LGE in the basal septal and lateral walls.  - CPET (12/19, [facility]): abnormal FDG uptake consistent with inflammatory process of the myocardium. More especially involving the basal-inferior, Basal inferoseptal and mid inferoseptal walls. In the proper clinical settings this may represent active sarcoidosis in the myocardium. - Echo 02/2019 EF  50-55%, RV normal  - Echo 07/2021 showed mildly reduced LVEF, 45-50%, normal RV. Moderate BAE.  - Echo 3/23 EF 30-35%, mild RV dilation, severe biatrial enlargement. 2. 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  3. Atrial flutter: Paroxysmal.  4. 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  5. H/o VT  Review of Systems: All systems reviewed and negative except as per HPI.   Current Outpatient Medications  Medication Sig Dispense Refill   apixaban (ELIQUIS) 5 MG TABS tablet Take 1 tablet (5 mg total) by mouth 2 (two) times daily. 180 tablet 3   atorvastatin (LIPITOR) 40 MG tablet Take 1 tablet (40 mg total) by mouth daily. 90 tablet 3   dapagliflozin propanediol (FARXIGA) 10 MG TABS tablet Take 1 tablet (10 mg total) by mouth daily before breakfast. 30 tablet 11   spironolactone (ALDACTONE) 25 MG tablet Take 1 tablet (25 mg total) by mouth daily. 90 tablet 3   bisoprolol (ZEBETA) 5 MG tablet Take 0.5 tablets (2.5 mg total) by mouth daily. (Patient not taking: Reported on 02/27/2022)     valsartan (DIOVAN) 40 MG tablet Take 0.5 tablets (20 mg total) by mouth daily. (Patient not taking: Reported on 02/27/2022) 45 tablet 3   No current facility-administered medications for this encounter.    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[employer], previously an Arboriculturist   Social  Determinants of Radio broadcast assistant Strain: Not on Art therapist Insecurity: Not on file  Transportation Needs: Not on file  Physical Activity: Not on file  Stress: Not on file  Social Connections: Not on file  Intimate Partner Violence: Not on file      Family History  Problem Relation Age of Onset   Asthma Mother    Cancer Maternal Grandmother    Cancer Maternal Grandfather    Cancer Paternal Grandmother    Heart failure Maternal Uncle    Bradycardia Other        multiple family members with pacemakers   Heart failure Maternal Uncle s/p Cardiac Transplant   Maternal Cousin s/p Cardiac Transplant       Vitals:   02/27/22 0941  BP: (!) 94/50  Pulse: 71  SpO2: 97%  Weight: 104.1 kg (229 lb 9.6 oz)    PHYSICAL EXAM: General: NAD Neck: No JVD, no thyromegaly or thyroid nodule.  Lungs: Clear to auscultation bilaterally with  normal respiratory effort. CV: Nondisplaced PMI.  Heart regular S1/S2, no S3/S4, no murmur.  No peripheral edema.  No carotid bruit.  Normal pedal pulses.  Abdomen: Soft, nontender, no hepatosplenomegaly, no distention.  Skin: Intact without lesions or rashes.  Neurologic: Alert and oriented x 3.  Psych: Normal affect. Extremities: No clubbing or cyanosis.  HEENT: Normal.   ASSESSMENT & PLAN:  1. 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  Patient has had DCM associated with CHB and with atrial flutter.  Strong family history of cardiomyopathy and conduction disease/pacemaker placement.  Patient's mother also has LMNA gene mutation.  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 Patient has MDT CRT-D.  cMRI in 11/19 showed LV EF 44%, RV EF 62%, patchy mid-wall LGE in the basal septal and lateral walls.  cPET in 12/19 showed abnormal uptake in myocardium concerning for inflammatory  process.  There was initial concern for cardiac sarcoidosis as a unifying diagnosis, but no sign of pulmonary sarcoidosis on CT chest and patient is positive for LMNA gene mutation.  The cMRI and cPET patterns that the patient had can be seen with genetic cardiomyopathies, often appearing to masquerade as cardiac sarcoidosis.  I think cardiac sarcoidosis is unlikely and would not immunosuppress.  Most recent echo in 3/23 showed EF 30-35%, mild RV dilation. NYHA class II symptoms with prominent fatigue.  Not volume overloaded by exam or Optivol. He has struggled to tolerate GDMT, SBP generally runs in 90s.  - Continue bisoprolol 2.5 mg daily (he is going to confirm that he is taking it).  - Off Entresto due to orthostasis.  He has been off valsartan.  He is willing to retry it, will have him take valsartan 20 mg qhs.  - Continue spironolactone 25 mg daily. BMET/BNP today.  - Continue Farxiga 10 mg daily.   - I have referred to Dr. Broadus John for genetic counseling.  - Patient now has had an uncle and 2 cousins with heart transplants. He has EF 30-35% on most recent echo, NYHA class II currently but struggles to tolerate GDMT.  I am going to arrange for CPX.  He is interested in learning more about transplantation, I will arrange for appointment with him with Dr. Mosetta Pigeon at [facility].  2. Paroxysmal Atrial Flutter: Dates back to at least 2012, previously offered AFL ablation but declined.  Device interrogation shows 11.2% atrial flutter/fibrillation, seems to be having more recently. He feels palpitations when in atrial flutter.  In NSR today.  - Long-term Eliquis with CVA history.  - I think atrial flutter ablation would be reasonable with increasing burden => will message Dr. Caryl Comes about this to get his opinion.   3. Rt MCA CVA: Recent MCA infarct due to right M1 occlusion 3/23. Likely secondary to atrial flutter and cardiomyopathy (LVEF 30-35%). Had not been anticoagulated. s/p mechanical thrombectomy by IR -  Continue Eliquis 5 mg bid.  - Continue statin.  4. CHB: MDT CRT-D.  >98% BiV paced on device interrogation.  - concern for LMNA CMP as outlined above  5. H/o VT: Has ICD, followed by Dr. Caryl Comes  - no recent VT on device interrogation   Followup 3 months, will discuss AFL ablation with Dr. Caryl Comes.    Loralie Champagne 02/27/2022

## 2022-03-05 ENCOUNTER — Encounter (HOSPITAL_COMMUNITY): Payer: BC Managed Care – PPO

## 2022-03-07 ENCOUNTER — Ambulatory Visit (INDEPENDENT_AMBULATORY_CARE_PROVIDER_SITE_OTHER): Payer: Self-pay

## 2022-03-07 DIAGNOSIS — I442 Atrioventricular block, complete: Secondary | ICD-10-CM

## 2022-03-08 LAB — CUP PACEART REMOTE DEVICE CHECK
Battery Remaining Longevity: 33 mo
Battery Voltage: 2.95 V
Brady Statistic AP VP Percent: 63.23 %
Brady Statistic AP VS Percent: 0 %
Brady Statistic AS VP Percent: 36.44 %
Brady Statistic AS VS Percent: 0.32 %
Brady Statistic RA Percent Paced: 63.04 %
Brady Statistic RV Percent Paced: 99 %
Date Time Interrogation Session: 20230823022825
HighPow Impedance: 74 Ohm
Implantable Lead Implant Date: 20200224
Implantable Lead Implant Date: 20200224
Implantable Lead Implant Date: 20200224
Implantable Lead Location: 753858
Implantable Lead Location: 753859
Implantable Lead Location: 753860
Implantable Lead Model: 5076
Implantable Lead Model: 6935
Implantable Pulse Generator Implant Date: 20200224
Lead Channel Impedance Value: 1026 Ohm
Lead Channel Impedance Value: 1083 Ohm
Lead Channel Impedance Value: 256.148
Lead Channel Impedance Value: 268.667
Lead Channel Impedance Value: 276.305
Lead Channel Impedance Value: 279.525
Lead Channel Impedance Value: 287.803
Lead Channel Impedance Value: 304 Ohm
Lead Channel Impedance Value: 399 Ohm
Lead Channel Impedance Value: 494 Ohm
Lead Channel Impedance Value: 494 Ohm
Lead Channel Impedance Value: 532 Ohm
Lead Channel Impedance Value: 589 Ohm
Lead Channel Impedance Value: 627 Ohm
Lead Channel Impedance Value: 912 Ohm
Lead Channel Impedance Value: 950 Ohm
Lead Channel Impedance Value: 988 Ohm
Lead Channel Impedance Value: 988 Ohm
Lead Channel Pacing Threshold Amplitude: 0.875 V
Lead Channel Pacing Threshold Amplitude: 1 V
Lead Channel Pacing Threshold Pulse Width: 0.4 ms
Lead Channel Pacing Threshold Pulse Width: 0.4 ms
Lead Channel Sensing Intrinsic Amplitude: 2.125 mV
Lead Channel Sensing Intrinsic Amplitude: 2.125 mV
Lead Channel Sensing Intrinsic Amplitude: 6 mV
Lead Channel Sensing Intrinsic Amplitude: 6 mV
Lead Channel Setting Pacing Amplitude: 1.75 V
Lead Channel Setting Pacing Amplitude: 2.5 V
Lead Channel Setting Pacing Amplitude: 2.5 V
Lead Channel Setting Pacing Pulse Width: 0.4 ms
Lead Channel Setting Pacing Pulse Width: 0.6 ms
Lead Channel Setting Sensing Sensitivity: 0.3 mV

## 2022-03-13 ENCOUNTER — Encounter: Payer: Self-pay | Admitting: Cardiology

## 2022-03-13 ENCOUNTER — Encounter (HOSPITAL_COMMUNITY): Payer: BC Managed Care – PPO

## 2022-03-20 ENCOUNTER — Ambulatory Visit (HOSPITAL_COMMUNITY): Payer: BC Managed Care – PPO | Attending: Cardiology

## 2022-03-20 ENCOUNTER — Telehealth (HOSPITAL_COMMUNITY): Payer: Self-pay | Admitting: Vascular Surgery

## 2022-03-20 DIAGNOSIS — I5022 Chronic systolic (congestive) heart failure: Secondary | ICD-10-CM

## 2022-03-20 DIAGNOSIS — R0600 Dyspnea, unspecified: Secondary | ICD-10-CM | POA: Insufficient documentation

## 2022-03-20 NOTE — Telephone Encounter (Signed)
Returned pt call to resch missed 9/5 cpx

## 2022-03-21 ENCOUNTER — Telehealth (HOSPITAL_COMMUNITY): Payer: Self-pay

## 2022-03-21 NOTE — Telephone Encounter (Signed)
Referral faxed to [facility] on 03/21/22 at 10.00am

## 2022-03-27 ENCOUNTER — Ambulatory Visit: Payer: BC Managed Care – PPO | Attending: Genetic Counselor | Admitting: Genetic Counselor

## 2022-03-30 NOTE — Progress Notes (Signed)
Post Test Genetic Consult  Referring Provider: Loralie Champagne, MD   Referral Reason  Wendie Chess, son of patient, [name] (DOB: 11/07/1941) was referred for genetic consult and testing of the familial mutation in LMNA (c.1157+1G>A) that was previously detected in his mother.   Genetic testing performed by Althia Forts (Report date- 07/29/2014, Cephus Shelling ID #: [id number]) identified a pathogenic variant in Barbara's niece. She was found to be heterozygous for c.1157+1G>A in LMNA gene that encodes intermediate filament nuclear envelope proteins, lamin A and C. This variant disrupts the canonical splice site thereby resulting in a defective transcript and/or protein. This mutation is considered a loss of protein function type of mutation.  Loss of function mutations in LMNA is a known mechanism of disease in dilated cardiomyopathy. Mutations in LMNA accounts for 10% of dilated cardiomyopathy (DCM) with autosomal dominant inheritance. Individuals with LMNA mutation have progressive conduction system disease, arrhythmia and systolic impairment. Lamin A/C heart disease is more malignant than other common DCM due to high event rates even when the left ventricular impairment is mild.   LMNA c.1157+1G>A was initially reported by Pasotti et al 2008, in a DCM patient with Vtach and Vfib. This pathogenic variant was also fond to segregate with disease in the family. This variant has also been reported by Lucianne Lei Spaendonck-Zwarts et al, 2013 in a DCM patient in a cohort of 418 DCM patients. This report identified a high prevalence of malignant ventricular arrhythmias and end-stage heart failure in DCM patients with pathogenic variants in LMNA and PLN genes.  Impression  Therefore, based on the above information, the LMNA c.1157+1G>A variant is interpreted as a pathogenic variant for dilated cardiomyopathy.  Recommendations                                                                                                                                                                                                                                                                All first-degree relatives should undergo genetic testing for this variant and seek genetic counseling and cardiology screening for DCM, if tested positive for the LMNA c.1157+1G>A familial pathogenic variant.  Personal Medical Information Lonzell (IV.7 on pedigree), an Arboriculturist, is the son of the proband (indicated by arrow) in this pedigree. He is here today with his  wife, Andi Hence. He reports having cardiac issues since the age of 51. He was diagnosed with Afib and was later found to have an enlarged heart at age 51 and had a dual ICD-pacemaker implanted at that time. He recently underwent genetic testing for the familial LMNA pathogenic variant and was found to harbor this variant.  Family History (excerpts as reported by Pamala Hurry at her [facility] in 2020) His mother, Pamala Hurry (III.2 on pedigree) is a very pleasant 53 year-old Caucasian lady who stays physically active. She began experiencing symptoms of chest pains at age 62. She was diagnosed with Afib at that time and had an EF of 55%. She reports her pulse rate dropping, the lowest being 30bpm. She had a pacemaker at age 71 that was replaced in Dec 2018. She states that she is totally pacer dependent at [place] informs me that her last echocardiogram, about a year ago was normal as was her stress test. Currently, she reports an EF of 45%. Pamala Hurry (III.52) has an 64 year-old elder brother (III.1) and two younger brothers (III.3, III.4). While, her two younger brothers have hypertension and T2 diabetes, her elder brother (III.1) has significant heart disease. He was diagnosed with NICM at age 57. She reports that he had bradycardia that got progressively worse with increasing shortness of breath, chest pains upon exertion and fluid accumulation. He underwent a heart transplant at age 44. He  has 5 children- 4 daughters (IV.1, IV.2, IV.4, IV.5) and a son  (IV.3). She states that his son (IV.3) presented with similar symptoms as his dad at age 52. As his symptoms worsened, he had a heart transplant at age 77. She reports that his EF was 15% and had dropped to 10% when he had his heart transplant. His oldest sister (IV.1) was diagnosed with Afib and underwent an ablation that was unsuccessful. She has a dual Clinical biochemist. Her younger sister (IV.2) is a Quarry manager. She reportedly had a stroke while at work and was found to have Afib. She had an ablation that was not successful and eventually had an ICD implanted at age 26. She underwent genetic testing and was found to harbor the pathogenic variant in LMNA (c.1157+1G>A) as reported above. Her siblings and father have not undergone genetic testing for this familial pathogenic variant.  Based on the molecular findings of this variant and the clinical reports regarding this variant, it is highly likely that the LMNA c.1157+1G>A variant is responsible for NICM in the family.  She does not have children.   Pamala Hurry has four children- 3 daughters, ages 84. 68 and 79 (IV.6, IV.8, IV.9) and a son, age 56. Her son is our patient today, Deron (IV.7). While her two younger daughters have not undergone cardiology screening, her eldest daughter had a normal echo and EKG. Pamala Hurry is very concerned about her children, especially her son having NICM and possibly needing a heart transplant later in life.   Barbara's father (II.4) was a functioning alcoholic. He died of a heart attack at age 71. There is no history of heart disease amongst his siblings (II.1-II.3). She is not aware of her paternal grandparent's medical history or cause of death.  Barbara's mother had heart disease since the age of 49. She had chest pains and pedal edema but did not seek cardiac care. She reports that her mother had a long, drawn-out painful death. She died of hemorrhagic  pancreatitis at age 60 per her autopsy report.  Her brother (II.6) died of natural causes  at age 42. She reports that her father (I.3) died of arteriosclerosis and mother (I.4) died of coronary artery disease.    Impression and Plan  In summary, Vinson has inherited the familial pathogenic LMNA variant that was previously reported in his mother and asymptomatic cousin (IV.2) and now has been detected in his cousin with heart transplant at age 20 (V.3) indicating variable clinical expression for this mutation.  I explained to them that it is important for their daughter, age 36 to undergo genetic testing for the familial LMNA pathogenic variant and to be vigilant about her cardiac health.  As mutations in the LMNA gene are inherited in an autosomal dominant manner, she is at a 50% risk of inheriting this mutation. In addition, his siblings should also undergo genetic testing for this familial variant to determine their genotype and need for appropriate surveillance or treatment.   In addition, we discussed the protections afforded by the Genetic Information Non-Discrimination Act (GINA). I explained to them that GINA protects him from losing his employment or health insurance based on his genotype. However, these protections do not cover life insurance and disability. They verbalized understanding of this.  Please note that the patient has not been counseled in this visit on personal, cultural, or ethical issues that he may face due to his heart condition. Answered other questions related they had regarding this gene mutation and f/u.   Lattie Corns, Ph.D, [facility] Clinical Molecular Geneticist

## 2022-04-03 NOTE — Progress Notes (Signed)
Remote ICD transmission.   

## 2022-04-04 ENCOUNTER — Encounter: Payer: Self-pay | Admitting: Internal Medicine

## 2022-04-04 ENCOUNTER — Ambulatory Visit: Payer: BC Managed Care – PPO | Attending: Internal Medicine | Admitting: Internal Medicine

## 2022-04-04 VITALS — BP 110/74 | HR 79 | Ht 79.0 in | Wt 225.0 lb

## 2022-04-04 DIAGNOSIS — I442 Atrioventricular block, complete: Secondary | ICD-10-CM | POA: Diagnosis not present

## 2022-04-04 DIAGNOSIS — I4892 Unspecified atrial flutter: Secondary | ICD-10-CM

## 2022-04-04 DIAGNOSIS — Z95810 Presence of automatic (implantable) cardiac defibrillator: Secondary | ICD-10-CM | POA: Diagnosis not present

## 2022-04-04 LAB — CUP PACEART INCLINIC DEVICE CHECK
Battery Remaining Longevity: 32 mo
Battery Voltage: 2.94 V
Brady Statistic AP VP Percent: 92.03 %
Brady Statistic AP VS Percent: 0 %
Brady Statistic AS VP Percent: 7.89 %
Brady Statistic AS VS Percent: 0.07 %
Brady Statistic RA Percent Paced: 91.85 %
Brady Statistic RV Percent Paced: 98.7 %
Date Time Interrogation Session: 20230920163626
HighPow Impedance: 79 Ohm
Implantable Lead Implant Date: 20200224
Implantable Lead Implant Date: 20200224
Implantable Lead Implant Date: 20200224
Implantable Lead Location: 753858
Implantable Lead Location: 753859
Implantable Lead Location: 753860
Implantable Lead Model: 5076
Implantable Lead Model: 6935
Implantable Pulse Generator Implant Date: 20200224
Lead Channel Impedance Value: 1026 Ohm
Lead Channel Impedance Value: 251.66 Ohm
Lead Channel Impedance Value: 264.643
Lead Channel Impedance Value: 270 Ohm
Lead Channel Impedance Value: 276.305
Lead Channel Impedance Value: 282.15 Ohm
Lead Channel Impedance Value: 323 Ohm
Lead Channel Impedance Value: 437 Ohm
Lead Channel Impedance Value: 456 Ohm
Lead Channel Impedance Value: 494 Ohm
Lead Channel Impedance Value: 513 Ohm
Lead Channel Impedance Value: 570 Ohm
Lead Channel Impedance Value: 627 Ohm
Lead Channel Impedance Value: 855 Ohm
Lead Channel Impedance Value: 893 Ohm
Lead Channel Impedance Value: 912 Ohm
Lead Channel Impedance Value: 969 Ohm
Lead Channel Impedance Value: 969 Ohm
Lead Channel Pacing Threshold Amplitude: 0.875 V
Lead Channel Pacing Threshold Amplitude: 0.875 V
Lead Channel Pacing Threshold Amplitude: 1 V
Lead Channel Pacing Threshold Amplitude: 1 V
Lead Channel Pacing Threshold Amplitude: 1.5 V
Lead Channel Pacing Threshold Pulse Width: 0.4 ms
Lead Channel Pacing Threshold Pulse Width: 0.4 ms
Lead Channel Pacing Threshold Pulse Width: 0.4 ms
Lead Channel Pacing Threshold Pulse Width: 0.4 ms
Lead Channel Pacing Threshold Pulse Width: 0.6 ms
Lead Channel Sensing Intrinsic Amplitude: 0.5 mV
Lead Channel Sensing Intrinsic Amplitude: 0.5 mV
Lead Channel Sensing Intrinsic Amplitude: 9.75 mV
Lead Channel Sensing Intrinsic Amplitude: 9.75 mV
Lead Channel Setting Pacing Amplitude: 1.75 V
Lead Channel Setting Pacing Amplitude: 2.5 V
Lead Channel Setting Pacing Amplitude: 2.5 V
Lead Channel Setting Pacing Pulse Width: 0.4 ms
Lead Channel Setting Pacing Pulse Width: 0.6 ms
Lead Channel Setting Sensing Sensitivity: 0.3 mV

## 2022-04-04 NOTE — Progress Notes (Signed)
HPI Mr. Ricardo Jones returns today for ep eval. 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 He has had recurrent atrial flutter with a CVR. He is referred by Dr. Aundra Dubin for consideration of atrial flutter ablation. He has been on eliquis.  No Known Allergies   Current Outpatient Medications  Medication Sig Dispense Refill   apixaban (ELIQUIS) 5 MG TABS tablet Take 1 tablet (5 mg total) by mouth 2 (two) times daily. 180 tablet 3   atorvastatin (LIPITOR) 40 MG tablet Take 1 tablet (40 mg total) by mouth daily. 90 tablet 3   bisoprolol (ZEBETA) 5 MG tablet Take 0.5 tablets (2.5 mg total) by mouth daily.     dapagliflozin propanediol (FARXIGA) 10 MG TABS tablet Take 1 tablet (10 mg total) by mouth daily before breakfast. 30 tablet 11   spironolactone (ALDACTONE) 25 MG tablet Take 1 tablet (25 mg total) by mouth daily. 90 tablet 3   valsartan (DIOVAN) 40 MG tablet Take 0.5 tablets (20 mg total) by mouth daily. 45 tablet 3   No current facility-administered medications for this visit.     Past Medical History:  Diagnosis Date   Acute right MCA stroke ([place]) 09/25/2021   s/p thrombectomy   Bradycardia    asymptomatic   Chronic systolic CHF (congestive heart failure), NYHA class 2 (HCC)    Complete heart block (HCC)    s/p CRT-D   Hyperlipidemia LDL goal <100    S/P dilatation of esophageal stricture    Typical atrial flutter (HCC)     ROS:   All systems reviewed and negative except as noted in the HPI.   Past Surgical History:  Procedure Laterality Date   BIV ICD INSERTION CRT-D N/A 09/08/2018   Procedure: BIV ICD INSERTION CRT-D;  Surgeon: Evans Lance, MD;  Location: [place];  Service: Cardiovascular;  Laterality: N/A;   IR CT HEAD LTD  09/25/2021   IR PERCUTANEOUS ART THROMBECTOMY/INFUSION INTRACRANIAL INC DIAG ANGIO  09/25/2021   RADIOLOGY WITH ANESTHESIA N/A  09/25/2021   Procedure: IR WITH ANESTHESIA;  Surgeon: Luanne Bras, MD;  Location: [place];  Service: Radiology;  Laterality: N/A;   THROAT SURGERY  2012     Family History  Problem Relation Age of Onset   Asthma Mother    Cancer Maternal Grandmother    Cancer Maternal Grandfather    Cancer Paternal Grandmother    Heart failure Maternal Uncle    Bradycardia Other        multiple family members with pacemakers   Heart failure Other      Social History   Socioeconomic History   Marital status: Single    Spouse name: Not on file   Number of children: Not on file   Years of education: Not on file   Highest education level: Not on file  Occupational History   Not on file  Tobacco Use   Smoking status: Never   Smokeless tobacco: Never  Vaping Use   Vaping Use: Never used  Substance and Sexual Activity   Alcohol use: Yes    Comment: occassionally   Drug use: No   Sexual activity: Not on file  Other Topics Concern   Not on file  Social History Narrative   Works as a Scientist, 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[employer], previously an Civil engineer, contracting  Resource Strain: Not on file  Food Insecurity: Not on file  Transportation Needs: Not on file  Physical Activity: Not on file  Stress: Not on file  Social Connections: Not on file  Intimate Partner Violence: Not on file     Ht '6\' 7"'$  (2.007 m)   BMI 25.87 kg/m   Physical Exam:  Well appearing NAD HEENT: Unremarkable Neck:  No JVD, no thyromegally Lymphatics:  No adenopathy Back:  No CVA tenderness Lungs:  Clear with no wheezes HEART:  Regular rate rhythm, no murmurs, no rubs, no clicks Abd:  soft, positive bowel sounds, no organomegally, no rebound, no guarding Ext:  2 plus pulses, no edema, no cyanosis, no clubbing Skin:  No rashes no nodules Neuro:  CN II through XII intact, motor grossly intact  EKG - nsr with AV pacing  DEVICE  Normal device function.  See PaceArt for  details.   Assess/Plan:  Parox atrial flutter - I have discussed the treatment options with the patient and recommended EP study and ablation. He will call us if he wishes to proceed. I reviewed the risks/benefits/goals/expectations with the patient. ICD - his medtronic BIV ICD is working normally. Chronic systolic heart failure - he is on maximal GDMT under the direction of Dr. Aundra Dubin.   Carleene Overlie Kayani Rapaport,MD

## 2022-04-04 NOTE — Patient Instructions (Addendum)
Medication Instructions:  Your physician recommends that you continue on your current medications as directed. Please refer to the Current Medication list given to you today.  *If you need a refill on your cardiac medications before your next appointment, please call your pharmacy*  Lab Work: None ordered.  If you have labs (blood work) drawn today and your tests are completely normal, you will receive your results only by: [place] (if you have MyChart) OR A paper copy in the mail If you have any lab test that is abnormal or we need to change your treatment, we will call you to review the results.  Testing/Procedures: None ordered.  Follow-Up: At [facility], you and your health needs are our priority.  As part of our continuing mission to provide you with exceptional heart care, we have created designated Provider Care Teams.  These Care Teams include your primary Cardiologist (physician) and Advanced Practice Providers (APPs -  Physician Assistants and Nurse Practitioners) who all work together to provide you with the care you need, when you need it.  We recommend signing up for the patient portal called "MyChart".  Sign up information is provided on this After Visit Summary.  MyChart is used to connect with patients for Virtual Visits (Telemedicine).  Patients are able to view lab/test results, encounter notes, upcoming appointments, etc.  Non-urgent messages can be sent to your provider as well.   To learn more about what you can do with MyChart, go to [email or web address].    NOVEMBER ABLATION DATES FOR CONSIDERATION:  FRI- 11/3  CHECK IN 530 AM MON- 11/6  CHECK IN 530 AM MON- 11/13  CHECK IN 530 AM WED- 11/29   CHECK IN 630 AM    The format for your next appointment:   In Person  Provider:   Cristopher Peru, MD{or one of the following Advanced Practice Providers on your designated Care Team:   Tommye Standard, [place] Legrand Como "Jonni Sanger" Chalmers Cater, [place]  Remote monitoring is  used to monitor your ICD from home. This monitoring reduces the number of office visits required to check your device to one time per year. It allows [place] to keep an eye on the functioning of your device to ensure it is working properly. You are scheduled for a device check from home on 06/06/2022. You may send your transmission at any time that day. If you have a wireless device, the transmission will be sent automatically. After your physician reviews your transmission, you will receive a postcard with your next transmission date.  Cardiac Ablation Cardiac ablation is a procedure to destroy, or ablate, a small amount of heart tissue in very specific places. The heart has many electrical connections. Sometimes these connections are abnormal and can cause the heart to beat very fast or irregularly. Ablating some of the areas that cause problems can improve the heart's rhythm or return it to normal. Ablation may be done for people who: Have Wolff-Parkinson-White syndrome. Have fast heart rhythms (tachycardia). Have taken medicines for an abnormal heart rhythm (arrhythmia) that were not effective or caused side effects. Have a high-risk heartbeat that may be life-threatening. During the procedure, a small incision is made in the neck or the groin, and a long, thin tube (catheter) is inserted into the incision and moved to the heart. Small devices (electrodes) on the tip of the catheter will send out electrical currents. A type of X-ray (fluoroscopy) will be used to help guide the catheter and to provide images of the heart. Tell  a health care provider about: Any allergies you have. All medicines you are taking, including vitamins, herbs, eye drops, creams, and over-the-counter medicines. Any problems you or family members have had with anesthetic medicines. Any blood disorders you have. Any surgeries you have had. Any medical conditions you have, such as kidney failure. Whether you are pregnant or may be  pregnant. What are the risks? Generally, this is a safe procedure. However, problems may occur, including: Infection. Bruising and bleeding at the catheter insertion site. Bleeding into the chest, especially into the sac that surrounds the heart. This is a serious complication. Stroke or blood clots. Damage to nearby structures or organs. Allergic reaction to medicines or dyes. Need for a permanent pacemaker if the normal electrical system is damaged. A pacemaker is a small computer that sends electrical signals to the heart and helps your heart beat normally. The procedure not being fully effective. This may not be recognized until months later. Repeat ablation procedures are sometimes done. What happens before the procedure? Medicines Ask your health care provider about: Changing or stopping your regular medicines. This is especially important if you are taking diabetes medicines or blood thinners. Taking medicines such as aspirin and ibuprofen. These medicines can thin your blood. Do not take these medicines unless your health care provider tells you to take them. Taking over-the-counter medicines, vitamins, herbs, and supplements. General instructions Follow instructions from your health care provider about eating or drinking restrictions. Plan to have someone take you home from the hospital or clinic. If you will be going home right after the procedure, plan to have someone with you for 24 hours. Ask your health care provider what steps will be taken to prevent infection. What happens during the procedure?  An IV will be inserted into one of your veins. You will be given a medicine to help you relax (sedative). The skin on your neck or groin will be numbed. An incision will be made in your neck or your groin. A needle will be inserted through the incision and into a large vein in your neck or groin. A catheter will be inserted into the needle and moved to your heart. Dye may be  injected through the catheter to help your surgeon see the area of the heart that needs treatment. Electrical currents will be sent from the catheter to ablate heart tissue in desired areas. There are three types of energy that may be used to do this: Heat (radiofrequency energy). Laser energy. Extreme cold (cryoablation). When the tissue has been ablated, the catheter will be removed. Pressure will be held on the insertion area to prevent a lot of bleeding. A bandage (dressing) will be placed over the insertion area. The exact procedure may vary among health care providers and hospitals. What happens after the procedure? Your blood pressure, heart rate, breathing rate, and blood oxygen level will be monitored until you leave the hospital or clinic. Your insertion area will be monitored for bleeding. You will need to lie still for a few hours to ensure that you do not bleed from the insertion area. Do not drive for 24 hours or as long as told by your health care provider. Summary Cardiac ablation is a procedure to destroy, or ablate, a small amount of heart tissue using an electrical current. This procedure can improve the heart rhythm or return it to normal. Tell your health care provider about any medical conditions you may have and all medicines you are taking to treat them.  This is a safe procedure, but problems may occur. Problems may include infection, bruising, damage to nearby organs or structures, or allergic reactions to medicines. Follow your health care provider's instructions about eating and drinking before the procedure. You may also be told to change or stop some of your medicines. After the procedure, do not drive for 24 hours or as long as told by your health care provider. This information is not intended to replace advice given to you by your health care provider. Make sure you discuss any questions you have with your health care provider. Document Revised: 09/22/2021 Document  Reviewed: 05/11/2019 Elsevier Patient Education  [place].

## 2022-04-05 ENCOUNTER — Telehealth: Payer: Self-pay | Admitting: Internal Medicine

## 2022-04-05 DIAGNOSIS — I4891 Unspecified atrial fibrillation: Secondary | ICD-10-CM

## 2022-04-05 DIAGNOSIS — I4892 Unspecified atrial flutter: Secondary | ICD-10-CM

## 2022-04-05 NOTE — Telephone Encounter (Signed)
   Pt is calling to schedule ablation. Pt said he wants the 06/13/22 date

## 2022-04-09 NOTE — Telephone Encounter (Signed)
Pt calling back to f/u on getting Ablation scheduled. Please advise

## 2022-04-09 NOTE — Telephone Encounter (Signed)
Advised patient that Dr. Tanna Furry nurse is not in the office today but will be in touch with him later this week to schedule his ablation.

## 2022-04-10 NOTE — Telephone Encounter (Signed)
Pt contacted per his request to be scheduled for an A-Flutter ablation with Dr. Lovena Le on 06/13/2022 at 67 am.  ( Anesth is available. ) Will f/u with patient to set up lab appointment, and give him the instruction letter.   Follow up with details required.

## 2022-04-17 ENCOUNTER — Encounter (HOSPITAL_COMMUNITY): Payer: BC Managed Care – PPO

## 2022-04-22 ENCOUNTER — Other Ambulatory Visit: Payer: Self-pay | Admitting: Cardiovascular Disease

## 2022-05-30 ENCOUNTER — Encounter (HOSPITAL_COMMUNITY): Payer: BC Managed Care – PPO | Admitting: Cardiology

## 2022-05-31 ENCOUNTER — Ambulatory Visit (INDEPENDENT_AMBULATORY_CARE_PROVIDER_SITE_OTHER): Payer: BC Managed Care – PPO | Admitting: Neurology

## 2022-05-31 ENCOUNTER — Encounter: Payer: Self-pay | Admitting: Neurology

## 2022-05-31 VITALS — BP 115/70 | HR 77 | Ht 79.0 in | Wt 218.5 lb

## 2022-05-31 DIAGNOSIS — I4892 Unspecified atrial flutter: Secondary | ICD-10-CM

## 2022-05-31 DIAGNOSIS — I63411 Cerebral infarction due to embolism of right middle cerebral artery: Secondary | ICD-10-CM | POA: Diagnosis not present

## 2022-05-31 NOTE — Patient Instructions (Signed)
I had a long d/w patient and his wife about his recent embolic stroke and atrial flutter, risk for recurrent stroke/TIAs, personally independently reviewed imaging studies and stroke evaluation results and answered questions.Continue Eliquis (apixaban) daily  for secondary stroke prevention and maintain strict control of hypertension with blood pressure goal below 130/90, diabetes with hemoglobin A1c goal below 6.5% and lipids with LDL cholesterol goal below 70 mg/dL. I also advised the patient to eat a healthy diet with plenty of whole grains, cereals, fruits and vegetables, exercise regularly and maintain ideal body weight.  We also briefly discussed possible participation in the [place] [employer] ( Eliquis versus Milvexian )stroke prevention study the patient is refusing at the present time.  Followup in the future with me in 6 months or call earlier if necessary.  Stroke Prevention Some medical conditions and behaviors can lead to a higher chance of having a stroke. You can help prevent a stroke by eating healthy, exercising, not smoking, and managing any medical conditions you have. Stroke is a leading cause of functional impairment. Primary prevention is particularly important because a majority of strokes are first-time events. Stroke changes the lives of not only those who experience a stroke but also their family and other caregivers. How can this condition affect me? A stroke is a medical emergency and should be treated right away. A stroke can lead to brain damage and can sometimes be life-threatening. If a person gets medical treatment right away, there is a better chance of surviving and recovering from a stroke. What can increase my risk? The following medical conditions may increase your risk of a stroke: Cardiovascular disease. High blood pressure (hypertension). Diabetes. High cholesterol. Sickle cell disease. Blood clotting disorders (hypercoagulable state). Obesity. Sleep disorders  (obstructive sleep apnea). Other risk factors include: Being older than age 26. Having a history of blood clots, stroke, or mini-stroke (transient ischemic attack, TIA). Genetic factors, such as race, ethnicity, or a family history of stroke. Smoking cigarettes or using other tobacco products. Taking birth control pills, especially if you also use tobacco. Heavy use of alcohol or drugs, especially cocaine and methamphetamine. Physical inactivity. What actions can I take to prevent this? Manage your health conditions High cholesterol levels. Eating a healthy diet is important for preventing high cholesterol. If cholesterol cannot be managed through diet alone, you may need to take medicines. Take any prescribed medicines to control your cholesterol as told by your health care provider. Hypertension. To reduce your risk of stroke, try to keep your blood pressure below 130/80. Eating a healthy diet and exercising regularly are important for controlling blood pressure. If these steps are not enough to manage your blood pressure, you may need to take medicines. Take any prescribed medicines to control hypertension as told by your health care provider. Ask your health care provider if you should monitor your blood pressure at home. Have your blood pressure checked every year, even if your blood pressure is normal. Blood pressure increases with age and some medical conditions. Diabetes. Eating a healthy diet and exercising regularly are important parts of managing your blood sugar (glucose). If your blood sugar cannot be managed through diet and exercise, you may need to take medicines. Take any prescribed medicines to control your diabetes as told by your health care provider. Get evaluated for obstructive sleep apnea. Talk to your health care provider about getting a sleep evaluation if you snore a lot or have excessive sleepiness. Make sure that any other medical conditions you have,  such as  atrial fibrillation or atherosclerosis, are managed. Nutrition Follow instructions from your health care provider about what to eat or drink to help manage your health condition. These instructions may include: Reducing your daily calorie intake. Limiting how much salt (sodium) you use to 1,500 milligrams (mg) each day. Using only healthy fats for cooking, such as olive oil, canola oil, or sunflower oil. Eating healthy foods. You can do this by: Choosing foods that are high in fiber, such as whole grains, and fresh fruits and vegetables. Eating at least 5 servings of fruits and vegetables a day. Try to fill one-half of your plate with fruits and vegetables at each meal. Choosing lean protein foods, such as lean cuts of meat, poultry without skin, fish, tofu, beans, and nuts. Eating low-fat dairy products. Avoiding foods that are high in sodium. This can help lower blood pressure. Avoiding foods that have saturated fat, trans fat, and cholesterol. This can help prevent high cholesterol. Avoiding processed and prepared foods. Counting your daily carbohydrate intake.  Lifestyle If you drink alcohol: Limit how much you have to: 0-1 drink a day for women who are not pregnant. 0-2 drinks a day for men. Know how much alcohol is in your drink. In the [place] one drink equals one 12 oz bottle of beer (332m), one 5 oz glass of wine (1425m, or one 1 oz glass of hard liquor (4490m Do not use any products that contain nicotine or tobacco. These products include cigarettes, chewing tobacco, and vaping devices, such as e-cigarettes. If you need help quitting, ask your health care provider. Avoid secondhand smoke. Do not use drugs. Activity  Try to stay at a healthy weight. Get at least 30 minutes of exercise on most days, such as: Fast walking. Biking. Swimming. Medicines Take over-the-counter and prescription medicines only as told by your health care provider. Aspirin or blood thinners  (antiplatelets or anticoagulants) may be recommended to reduce your risk of forming blood clots that can lead to stroke. Avoid taking birth control pills. Talk to your health care provider about the risks of taking birth control pills if: You are over 35 44ars old. You smoke. You get very bad headaches. You have had a blood clot. Where to find more information American Stroke Association: www.strokeassociation.org Get help right away if: You or a loved one has any symptoms of a stroke. "BE FAST" is an easy way to remember the main warning signs of a stroke: B - Balance. Signs are dizziness, sudden trouble walking, or loss of balance. E - Eyes. Signs are trouble seeing or a sudden change in vision. F - Face. Signs are sudden weakness or numbness of the face, or the face or eyelid drooping on one side. A - Arms. Signs are weakness or numbness in an arm. This happens suddenly and usually on one side of the body. S - Speech. Signs are sudden trouble speaking, slurred speech, or trouble understanding what people say. T - Time. Time to call emergency services. Write down what time symptoms started. You or a loved one has other signs of a stroke, such as: A sudden, severe headache with no known cause. Nausea or vomiting. Seizure. These symptoms may represent a serious problem that is an emergency. Do not wait to see if the symptoms will go away. Get medical help right away. Call your local emergency services (911 in the [place] Do not drive yourself to the hospital. Summary You can help to prevent a stroke by eating healthy,  exercising, not smoking, limiting alcohol intake, and managing any medical conditions you may have. Do not use any products that contain nicotine or tobacco. These include cigarettes, chewing tobacco, and vaping devices, such as e-cigarettes. If you need help quitting, ask your health care provider. Remember "BE FAST" for warning signs of a stroke. Get help right away if you or a  loved one has any of these signs. This information is not intended to replace advice given to you by your health care provider. Make sure you discuss any questions you have with your health care provider. Document Revised: 02/01/2020 Document Reviewed: 02/01/2020 Elsevier Patient Education  [place].

## 2022-05-31 NOTE — Progress Notes (Signed)
[facility] [address] [place]. [place] [address] [phone]       OFFICE FOLLOW-UP VISIT NOTE  Mr. JAHMIRE RUFFINS Date of Birth:  September 19, 1970 Medical Record Number:  [id number]   Referring MD: Rosalin Hawking  Reason for Referral: Stroke  HPI: Initial visit 11/16/2021 Mr. Hanssen is a pleasant 51 year old Caucasian male seen today for initial office consultation visit following stroke in March 2023.  History is obtained from the patient and his wife and review of electronic medical records and I personally reviewed pertinent available imaging films in PACS.  He has past medical history of congestive heart failure, cardiomyopathy, atrial flutter not on anticoagulation due to a low CHA2DS2-VASc score of 1.  He presented on 09/25/2021 for evaluation as a code stroke due to sudden onset of aphasia and left hemiparesis.  Patient was in the gym on elliptical when a bystander noticed that he was unresponsive and was acting off.  The patient needed assistance to get off the elliptical and EMS was activated.  On arrival EMS noticed right gaze deviation patient was nonverbal did not follow commands and had left hemiparesis.  A code stroke was activated in route.  NIH stroke scale was 22 upon arrival.  CT head was unremarkable he was given IV TNK and CT angiogram was obtained emergently which showed right M1 occlusion.  Patient was taken for emergent mechanical thrombectomy which was performed successfully with TICI 3 revascularization.  Patient was admitted to the intensive care unit for blood pressure was tightly controlled.  He did well and improved shortly thereafter.  MRI scan showed acute infarcts in the right basal ganglia and a portion of the posterior limb of internal capsule with few additional punctate scattered infarcts.  No hemorrhage.  Echocardiogram showed ejection fraction of 30 to 35% with global hypokinesis but no definite clot.  Patient had an ICD and interrogation did  reveal atrial flutter and fibrillation.  Patient was started on Eliquis and on Lipitor.  He was discharged home and had only a single visit with outpatient PT OT and was discharged and has done extremely well.  He states he is 96% better.  He still has some trouble with his handwriting which is not as good.  He has intermittent numbness and the medial 2 fingers of the left hand when occasionally in the right.  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  He has no trouble walking.  He is tolerating Eliquis well with only minor bruising and no bleeding.  His blood pressure is well controlled today it is 112/69.  Tolerating Lipitor without muscle aches and pains.  He does have regular follow-up visits with Dr. Aundra Dubin his cardiologist. Update 05/31/2022 ; he returns for follow-up after last visit 6 months ago.  He is accompanied by his wife.  Patient states is done well.  He has had no recurrent stroke or TIA symptoms.  He still has mild diminished fine motor skills in the left hand is likely due to his poor.  He gets tired more easily.  He has trouble speaking and voice becomes raspy in his diet.  He plans to have cardio ablation for his atrial flutter with Dr. Lovena Le and of this month.  He is wondering if he can come off the Eliquis after the ablation and I advised him that it is not a good idea.  He is tolerating Lipitor well without muscle aches and pains.  His blood pressure is under good control  and today it is 115/70.  LDL cholesterol on 09/26/2021 was 100 mg percent and hemoglobin A1c was 6.0.  He has no new complaints today. ROS:   14 system review of systems is positive for-fine motor skills, numbness in the hand, writing problems, minor bruising all other systems negative  PMH:  Past Medical History:  Diagnosis Date   Acute right MCA stroke ([place]) 09/25/2021   s/p thrombectomy   Bradycardia    asymptomatic   Chronic systolic CHF (congestive heart failure), NYHA class 2 (HCC)     Complete heart block (HCC)    s/p CRT-D   Hyperlipidemia LDL goal <100    S/P dilatation of esophageal stricture    Typical atrial flutter (HCC)     Social History:  Social History   Socioeconomic History   Marital status: Single    Spouse name: Not on file   Number of children: Not on file   Years of education: Not on file   Highest education level: Not on file  Occupational History   Not on file  Tobacco Use   Smoking status: Never   Smokeless tobacco: Never  Vaping Use   Vaping Use: Never used  Substance and Sexual Activity   Alcohol use: Yes    Comment: occassionally   Drug use: No   Sexual activity: Not on file  Other Topics Concern   Not on file  Social History Narrative   Works as a Scientist, 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 180 tablet 3   atorvastatin (LIPITOR) 40 MG tablet Take 1 tablet (40 mg total) by mouth daily. 90 tablet 3   bisoprolol (ZEBETA) 5 MG tablet Take 0.5 tablets (2.5 mg total) by mouth daily.     dapagliflozin propanediol (FARXIGA) 10 MG TABS tablet Take 1 tablet (10 mg total) by mouth daily before breakfast. 30 tablet 11   spironolactone (ALDACTONE) 25 MG tablet Take 1 tablet (25 mg total) by mouth daily. 90 tablet 3   valsartan (DIOVAN) 40 MG tablet Take 0.5 tablets (20 mg total) by mouth daily. 45 tablet 3   No current facility-administered medications on file prior to visit.    Allergies:  No Known Allergies  Physical Exam General: well developed, well  nourished pleasant tall middle-age Caucasian male, seated, in no evident distress Head: head normocephalic and atraumatic.   Neck: supple with no carotid or supraclavicular bruits Cardiovascular: regular rate and rhythm, no murmurs Musculoskeletal: no deformity Skin:  no rash/petichiae Vascular:  Normal pulses all extremities  Neurologic Exam Mental Status: Awake and fully alert. Oriented to place and time. Recent and remote memory intact. Attention span, concentration and fund of knowledge appropriate. Mood and affect appropriate.  Cranial Nerves: Fundoscopic exam not done. Pupils equal, briskly reactive to light. Extraocular movements full without nystagmus. Visual fields full to confrontation. Hearing intact. Facial sensation intact. Face, tongue, palate moves normally and symmetrically.  Motor: Normal bulk and tone. Normal strength in all tested extremity  muscles.  Diminished fine finger movements on the left.  Orbits right over left upper extremity. Sensory.: intact to touch , pinprick , position and vibratory sensation.  Subjective paresthesias in the left hand but no objective sensory loss Coordination: Rapid alternating movements normal in all extremities. Finger-to-nose and heel-to-shin performed accurately bilaterally. Gait and Station: Arises from chair without difficulty. Stance is normal. Gait demonstrates normal stride length and balance . Able to heel, toe and tandem walk without difficulty.  Reflexes: 1+ and symmetric. Toes downgoing.   NIHSS  0 Modified Rankin  1   ASSESSMENT: 51 year old Caucasian male with right MCA infarct and March 2023 secondary to right M1 occlusion likely cardiogenic embolism from atrial flutter not on anticoagulation previously due to low CHADS2-VASc score of 1.  Vascular risk factors of cardiomyopathy, atrial flutter and mild hyperlipidemia     PLAN: I had a long d/w patient and his wife about his recent embolic stroke and atrial flutter, risk for  recurrent stroke/TIAs, personally independently reviewed imaging studies and stroke evaluation results and answered questions.Continue Eliquis (apixaban) daily  for secondary stroke prevention and maintain strict control of hypertension with blood pressure goal below 130/90, diabetes with hemoglobin A1c goal below 6.5% and lipids with LDL cholesterol goal below 70 mg/dL. I also advised the patient to eat a healthy diet with plenty of whole grains, cereals, fruits and vegetables, exercise regularly and maintain ideal body weight.  We also briefly discussed possible participation in the [place] [employer] ( Eliquis versus Milvexian )stroke prevention study the patient is refusing at the present time.  Followup in the future with me in 6 months or call earlier if necessary.  Greater than 50% time during this 35-minute   visit was spent on counseling and coordination of care and discussion about stroke prevention and treatment and answering questions. Antony Contras, MD  Note: This document was prepared with digital dictation and possible smart phrase technology. Any transcriptional errors that result from this process are unintentional.

## 2022-06-06 ENCOUNTER — Ambulatory Visit (INDEPENDENT_AMBULATORY_CARE_PROVIDER_SITE_OTHER): Payer: Self-pay

## 2022-06-06 DIAGNOSIS — I428 Other cardiomyopathies: Secondary | ICD-10-CM

## 2022-06-06 LAB — CUP PACEART REMOTE DEVICE CHECK
Battery Remaining Longevity: 27 mo
Battery Voltage: 2.95 V
Brady Statistic AP VP Percent: 99.69 %
Brady Statistic AP VS Percent: 0 %
Brady Statistic AS VP Percent: 0.3 %
Brady Statistic AS VS Percent: 0 %
Brady Statistic RA Percent Paced: 99.66 %
Brady Statistic RV Percent Paced: 98.98 %
Date Time Interrogation Session: 20231122001703
HighPow Impedance: 76 Ohm
Implantable Lead Connection Status: 753985
Implantable Lead Connection Status: 753985
Implantable Lead Connection Status: 753985
Implantable Lead Implant Date: 20200224
Implantable Lead Implant Date: 20200224
Implantable Lead Implant Date: 20200224
Implantable Lead Location: 753858
Implantable Lead Location: 753859
Implantable Lead Location: 753860
Implantable Lead Model: 5076
Implantable Lead Model: 6935
Implantable Pulse Generator Implant Date: 20200224
Lead Channel Impedance Value: 1026 Ohm
Lead Channel Impedance Value: 235.98 Ohm
Lead Channel Impedance Value: 247.358
Lead Channel Impedance Value: 247.358
Lead Channel Impedance Value: 266 Ohm
Lead Channel Impedance Value: 270 Ohm
Lead Channel Impedance Value: 270 Ohm
Lead Channel Impedance Value: 399 Ohm
Lead Channel Impedance Value: 437 Ohm
Lead Channel Impedance Value: 437 Ohm
Lead Channel Impedance Value: 513 Ohm
Lead Channel Impedance Value: 570 Ohm
Lead Channel Impedance Value: 570 Ohm
Lead Channel Impedance Value: 855 Ohm
Lead Channel Impedance Value: 893 Ohm
Lead Channel Impedance Value: 950 Ohm
Lead Channel Impedance Value: 950 Ohm
Lead Channel Impedance Value: 950 Ohm
Lead Channel Pacing Threshold Amplitude: 0.875 V
Lead Channel Pacing Threshold Amplitude: 0.875 V
Lead Channel Pacing Threshold Pulse Width: 0.4 ms
Lead Channel Pacing Threshold Pulse Width: 0.4 ms
Lead Channel Sensing Intrinsic Amplitude: 0.875 mV
Lead Channel Sensing Intrinsic Amplitude: 0.875 mV
Lead Channel Sensing Intrinsic Amplitude: 9.75 mV
Lead Channel Sensing Intrinsic Amplitude: 9.75 mV
Lead Channel Setting Pacing Amplitude: 1.75 V
Lead Channel Setting Pacing Amplitude: 2.5 V
Lead Channel Setting Pacing Amplitude: 2.5 V
Lead Channel Setting Pacing Pulse Width: 0.4 ms
Lead Channel Setting Pacing Pulse Width: 0.6 ms
Lead Channel Setting Sensing Sensitivity: 0.3 mV
Zone Setting Status: 755011
Zone Setting Status: 755011

## 2022-06-11 ENCOUNTER — Telehealth: Payer: Self-pay | Admitting: Internal Medicine

## 2022-06-11 NOTE — Telephone Encounter (Signed)
Aflutter Ablation work up complete.  I called pt back and had to explain to him that he was never scheduled. He stated that he would need this procedure to be done before the end of the year because he had met his deductible already. I had to move a pt around on GT schedule but was able to fit the pt in on 12/13 @ 10:30.   He will have his labwork done in our office on 06/19/22.   Pt is aware that I will send his instructions via MyChart and I explained to him how to find the letter.

## 2022-06-11 NOTE — Telephone Encounter (Signed)
Pt calling back regarding Ablation due to not receiving a call yet. Please advise

## 2022-06-11 NOTE — Addendum Note (Signed)
Addended by: Carylon Perches on: 06/11/2022 03:02 PM   Modules accepted: Orders

## 2022-06-11 NOTE — Telephone Encounter (Signed)
Pt would like a callback regarding upcoming Ablation. Please advise

## 2022-06-13 NOTE — Telephone Encounter (Signed)
Note Aflutter Ablation work up complete.   I called pt back and had to explain to him that he was never scheduled. He stated that he would need this procedure to be done before the end of the year because he had met his deductible already. I had to move a pt around on GT schedule but was able to fit the pt in on 12/13 @ 10:30.    He will have his labwork done in our office on 06/19/22.   Pt is aware that I will send his instructions via MyChart and I explained to him how to find the letter.

## 2022-06-19 ENCOUNTER — Ambulatory Visit: Payer: BC Managed Care – PPO | Attending: Cardiovascular Disease

## 2022-06-19 DIAGNOSIS — I4892 Unspecified atrial flutter: Secondary | ICD-10-CM

## 2022-06-19 LAB — CBC
Hematocrit: 43 % (ref 37.5–51.0)
Hemoglobin: 14.6 g/dL (ref 13.0–17.7)
MCH: 29.9 pg (ref 26.6–33.0)
MCHC: 34 g/dL (ref 31.5–35.7)
MCV: 88 fL (ref 79–97)
Platelets: 151 10*3/uL (ref 150–450)
RBC: 4.89 x10E6/uL (ref 4.14–5.80)
RDW: 13.8 % (ref 11.6–15.4)
WBC: 7.6 10*3/uL (ref 3.4–10.8)

## 2022-06-19 LAB — BASIC METABOLIC PANEL
BUN/Creatinine Ratio: 23 — ABNORMAL HIGH (ref 9–20)
BUN: 21 mg/dL (ref 6–24)
CO2: 26 mmol/L (ref 20–29)
Calcium: 10 mg/dL (ref 8.7–10.2)
Chloride: 100 mmol/L (ref 96–106)
Creatinine, Ser: 0.91 mg/dL (ref 0.76–1.27)
Glucose: 90 mg/dL (ref 70–99)
Potassium: 4.6 mmol/L (ref 3.5–5.2)
Sodium: 137 mmol/L (ref 134–144)
eGFR: 101 mL/min/{1.73_m2} (ref >59)

## 2022-06-26 NOTE — Anesthesia Preprocedure Evaluation (Addendum)
Anesthesia Evaluation  Patient identified by MRN, date of birth, ID band Patient awake  General Assessment Comment:CODE STROKE  Reviewed: Allergy & Precautions, H&P , NPO status , Patient's Chart, lab work & pertinent test results  Airway Mallampati: I  TM Distance: >3 FB Neck ROM: Full    Dental no notable dental hx. (+) Teeth Intact, Dental Advisory Given   Pulmonary neg pulmonary ROS   Pulmonary exam normal breath sounds clear to auscultation       Cardiovascular +CHF  negative cardio ROS Normal cardiovascular exam+ dysrhythmias Atrial Fibrillation + Cardiac Defibrillator  Rhythm:Regular Rate:Normal   EF 45-50%   Neuro/Psych CVA, Residual Symptoms negative neurological ROS  negative psych ROS   GI/Hepatic negative GI ROS, Neg liver ROS,,,  Endo/Other  negative endocrine ROS    Renal/GU negative Renal ROS  negative genitourinary   Musculoskeletal negative musculoskeletal ROS (+)    Abdominal   Peds negative pediatric ROS (+)  Hematology negative hematology ROS (+)   Anesthesia Other Findings Day of surgery medications reviewed with the patient.  Reproductive/Obstetrics negative OB ROS                             Anesthesia Physical Anesthesia Plan  ASA: 3  Anesthesia Plan: General   Post-op Pain Management: Minimal or no pain anticipated   Induction: Intravenous  PONV Risk Score and Plan: 2 and Treatment may vary due to age or medical condition  Airway Management Planned: Oral ETT  Additional Equipment: None  Intra-op Plan:   Post-operative Plan: Extubation in OR  Informed Consent:      Dental advisory given  Plan Discussed with: CRNA and Anesthesiologist  Anesthesia Plan Comments:         Anesthesia Quick Evaluation

## 2022-06-26 NOTE — Pre-Procedure Instructions (Signed)
Attempted to call patient regarding procedure instructions.  Instructed patient on the following items: Arrival time 0630 Nothing to eat or drink after midnight No meds AM of procedure Responsible person to drive you home and stay with you for 24 hrs  Have you missed any doses of anti-coagulant Eliquis- if you have missed any doses please office know right away.

## 2022-06-27 ENCOUNTER — Other Ambulatory Visit: Payer: Self-pay

## 2022-06-27 ENCOUNTER — Encounter (HOSPITAL_COMMUNITY)
Admission: RE | Disposition: A | Discharge status: Home / Self Care | Payer: Self-pay | Source: Ambulatory Visit | Attending: Internal Medicine

## 2022-06-27 ENCOUNTER — Ambulatory Visit (HOSPITAL_COMMUNITY): Payer: BC Managed Care – PPO | Admitting: Anesthesiology

## 2022-06-27 ENCOUNTER — Encounter (HOSPITAL_COMMUNITY): Payer: Self-pay | Admitting: Internal Medicine

## 2022-06-27 ENCOUNTER — Ambulatory Visit (HOSPITAL_COMMUNITY)
Admission: RE | Admit: 2022-06-27 | Discharge: 2022-06-27 | Disposition: A | Discharge status: Home / Self Care | Payer: BC Managed Care – PPO | Source: Ambulatory Visit | Attending: Internal Medicine | Admitting: Internal Medicine

## 2022-06-27 DIAGNOSIS — I4892 Unspecified atrial flutter: Secondary | ICD-10-CM | POA: Diagnosis present

## 2022-06-27 DIAGNOSIS — I5022 Chronic systolic (congestive) heart failure: Secondary | ICD-10-CM | POA: Diagnosis not present

## 2022-06-27 DIAGNOSIS — I483 Typical atrial flutter: Secondary | ICD-10-CM | POA: Diagnosis not present

## 2022-06-27 DIAGNOSIS — Z8673 Personal history of transient ischemic attack (TIA), and cerebral infarction without residual deficits: Secondary | ICD-10-CM | POA: Diagnosis not present

## 2022-06-27 DIAGNOSIS — I442 Atrioventricular block, complete: Secondary | ICD-10-CM | POA: Insufficient documentation

## 2022-06-27 HISTORY — PX: A-FLUTTER ABLATION: EP1230

## 2022-06-27 SURGERY — A-FLUTTER ABLATION
Anesthesia: General

## 2022-06-27 MED ORDER — PROPOFOL 10 MG/ML IV BOLUS
INTRAVENOUS | Status: DC | PRN
  Administered 2022-06-27: 100 mg via INTRAVENOUS
  Administered 2022-06-27: 30 mg via INTRAVENOUS

## 2022-06-27 MED ORDER — ACETAMINOPHEN 325 MG PO TABS: 650.0000 mg | ORAL_TABLET | ORAL | Status: DC | PRN

## 2022-06-27 MED ORDER — DEXAMETHASONE SODIUM PHOSPHATE 10 MG/ML IJ SOLN
INTRAMUSCULAR | Status: DC | PRN
  Administered 2022-06-27: 5 mg via INTRAVENOUS

## 2022-06-27 MED ORDER — SODIUM CHLORIDE 0.9 % IV SOLN: INTRAVENOUS | Status: DC

## 2022-06-27 MED ORDER — HEPARIN SODIUM (PORCINE) 1000 UNIT/ML IJ SOLN
INTRAMUSCULAR | Status: DC | PRN
  Administered 2022-06-27: 1000 [IU] via INTRAVENOUS

## 2022-06-27 MED ORDER — PHENYLEPHRINE 80 MCG/ML (10ML) SYRINGE FOR IV PUSH (FOR BLOOD PRESSURE SUPPORT)
PREFILLED_SYRINGE | INTRAVENOUS | Status: DC | PRN
  Administered 2022-06-27: 80 ug via INTRAVENOUS

## 2022-06-27 MED ORDER — PHENYLEPHRINE HCL-NACL 20-0.9 MG/250ML-% IV SOLN
INTRAVENOUS | Status: DC | PRN
  Administered 2022-06-27: 20 ug/min via INTRAVENOUS

## 2022-06-27 MED ORDER — ONDANSETRON HCL 4 MG/2ML IJ SOLN: 4.0000 mg | Freq: Four times a day (QID) | INTRAMUSCULAR | Status: DC | PRN

## 2022-06-27 MED ORDER — SODIUM CHLORIDE 0.9% FLUSH: 3.0000 mL | INTRAVENOUS | Status: DC | PRN

## 2022-06-27 MED ORDER — HEPARIN (PORCINE) IN NACL 1000-0.9 UT/500ML-% IV SOLN
INTRAVENOUS | Status: AC
  Filled 2022-06-27: qty 500

## 2022-06-27 MED ORDER — SUGAMMADEX SODIUM 200 MG/2ML IV SOLN
INTRAVENOUS | Status: DC | PRN
  Administered 2022-06-27: 300 mg via INTRAVENOUS

## 2022-06-27 MED ORDER — HEPARIN SODIUM (PORCINE) 1000 UNIT/ML IJ SOLN
INTRAMUSCULAR | Status: AC
  Filled 2022-06-27: qty 10

## 2022-06-27 MED ORDER — SODIUM CHLORIDE 0.9 % IV SOLN: 250.0000 mL | INTRAVENOUS | Status: DC | PRN

## 2022-06-27 MED ORDER — ROCURONIUM BROMIDE 10 MG/ML (PF) SYRINGE
PREFILLED_SYRINGE | INTRAVENOUS | Status: DC | PRN
  Administered 2022-06-27: 50 mg via INTRAVENOUS
  Administered 2022-06-27: 30 mg via INTRAVENOUS

## 2022-06-27 MED ORDER — MIDAZOLAM HCL 2 MG/2ML IJ SOLN
INTRAMUSCULAR | Status: DC | PRN
  Administered 2022-06-27: 2 mg via INTRAVENOUS

## 2022-06-27 MED ORDER — LACTATED RINGERS IV SOLN: INTRAVENOUS | Status: DC | PRN

## 2022-06-27 MED ORDER — HEPARIN (PORCINE) IN NACL 1000-0.9 UT/500ML-% IV SOLN
INTRAVENOUS | Status: DC | PRN
  Administered 2022-06-27: 500 mL

## 2022-06-27 MED ORDER — LIDOCAINE 2% (20 MG/ML) 5 ML SYRINGE
INTRAMUSCULAR | Status: DC | PRN
  Administered 2022-06-27: 100 mg via INTRAVENOUS

## 2022-06-27 MED ORDER — ONDANSETRON HCL 4 MG/2ML IJ SOLN
INTRAMUSCULAR | Status: DC | PRN
  Administered 2022-06-27: 4 mg via INTRAVENOUS

## 2022-06-27 MED ORDER — FENTANYL CITRATE (PF) 250 MCG/5ML IJ SOLN
INTRAMUSCULAR | Status: DC | PRN
  Administered 2022-06-27 (×2): 50 ug via INTRAVENOUS

## 2022-06-27 SURGICAL SUPPLY — 11 items
BAG SNAP BAND KOVER 36X36 (MISCELLANEOUS) IMPLANT
CATH JOSEPH QUAD ALLRED 6F REP (CATHETERS) IMPLANT
CATH POLARIS X 2.5/5/2.5 DECAP (CATHETERS) IMPLANT
CATH SMTCH THERMOCOOL SF FJ (CATHETERS) IMPLANT
PACK EP LATEX FREE (CUSTOM PROCEDURE TRAY) ×1
PACK EP LF (CUSTOM PROCEDURE TRAY) ×1 IMPLANT
PAD DEFIB RADIO PHYSIO CONN (PAD) ×1 IMPLANT
PATCH CARTO3 (PAD) IMPLANT
SHEATH PINNACLE 6F 10CM (SHEATH) IMPLANT
SHEATH PINNACLE 8F 10CM (SHEATH) IMPLANT
TUBING SMART ABLATE COOLFLOW (TUBING) IMPLANT

## 2022-06-27 NOTE — Anesthesia Procedure Notes (Signed)
Procedure Name: Intubation Date/Time: 06/27/2022 8:42 AM  Performed by: Amadeo Garnet, CRNAPre-anesthesia Checklist: Patient identified, Emergency Drugs available, Suction available and Patient being monitored Patient Re-evaluated:Patient Re-evaluated prior to induction Oxygen Delivery Method: Circle system utilized Preoxygenation: Pre-oxygenation with 100% oxygen Induction Type: IV induction Ventilation: Mask ventilation without difficulty and Oral airway inserted - appropriate to patient size Laryngoscope Size: Mac and 4 Grade View: Grade I Tube type: Oral Tube size: 7.5 mm Number of attempts: 1 Airway Equipment and Method: Stylet and Oral airway Placement Confirmation: ETT inserted through vocal cords under direct vision, positive ETCO2 and breath sounds checked- equal and bilateral Secured at: 23 cm Tube secured with: Tape Dental Injury: Teeth and Oropharynx as per pre-operative assessment

## 2022-06-27 NOTE — Discharge Instructions (Signed)

## 2022-06-27 NOTE — H&P (Signed)
HPI Mr. Amundson returns today for ep eval. 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 He has had recurrent atrial flutter with a CVR. He is referred by Dr. Aundra Dubin for consideration of atrial flutter ablation. He has been on eliquis.  No Known Allergies           Current Outpatient Medications  Medication Sig Dispense Refill   apixaban (ELIQUIS) 5 MG TABS tablet Take 1 tablet (5 mg total) by mouth 2 (two) times daily. 180 tablet 3   atorvastatin (LIPITOR) 40 MG tablet Take 1 tablet (40 mg total) by mouth daily. 90 tablet 3   bisoprolol (ZEBETA) 5 MG tablet Take 0.5 tablets (2.5 mg total) by mouth daily.       dapagliflozin propanediol (FARXIGA) 10 MG TABS tablet Take 1 tablet (10 mg total) by mouth daily before breakfast. 30 tablet 11   spironolactone (ALDACTONE) 25 MG tablet Take 1 tablet (25 mg total) by mouth daily. 90 tablet 3   valsartan (DIOVAN) 40 MG tablet Take 0.5 tablets (20 mg total) by mouth daily. 45 tablet 3    No current facility-administered medications for this visit.            Past Medical History:  Diagnosis Date   Acute right MCA stroke ([place]) 09/25/2021    s/p thrombectomy   Bradycardia      asymptomatic   Chronic systolic CHF (congestive heart failure), NYHA class 2 (HCC)     Complete heart block (HCC)      s/p CRT-D   Hyperlipidemia LDL goal <100     S/P dilatation of esophageal stricture     Typical atrial flutter (HCC)        ROS:    All systems reviewed and negative except as noted in the HPI.          Past Surgical History:  Procedure Laterality Date   BIV ICD INSERTION CRT-D N/A 09/08/2018    Procedure: BIV ICD INSERTION CRT-D;  Surgeon: Evans Lance, MD;  Location: [facility];  Service: Cardiovascular;  Laterality: N/A;   IR CT HEAD LTD   09/25/2021   IR PERCUTANEOUS ART THROMBECTOMY/INFUSION INTRACRANIAL INC DIAG ANGIO    09/25/2021   RADIOLOGY WITH ANESTHESIA N/A 09/25/2021    Procedure: IR WITH ANESTHESIA;  Surgeon: Luanne Bras, MD;  Location: [facility];  Service: Radiology;  Laterality: N/A;   THROAT SURGERY   2012             Family History  Problem Relation Age of Onset   Asthma Mother     Cancer Maternal Grandmother     Cancer Maternal Grandfather     Cancer Paternal Grandmother     Heart failure Maternal Uncle     Bradycardia Other          multiple family members with pacemakers   Heart failure Other          Social History         Socioeconomic History   Marital status: Single      Spouse name: Not on file   Number of children: Not on file   Years of education: Not on file   Highest education level: Not on file  Occupational History   Not on file  Tobacco Use   Smoking status: Never   Smokeless tobacco:  Never  Vaping Use   Vaping Use: Never used  Substance and Sexual Activity   Alcohol use: Yes      Comment: occassionally   Drug use: No   Sexual activity: Not on file  Other Topics Concern   Not on file  Social History Narrative    Works as a Scientist, 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[employer], previously an Arboriculturist    Social Determinants of Adult nurse Strain: Not on Art therapist Insecurity: Not on file  Transportation Needs: Not on file  Physical Activity: Not on file  Stress: Not on file  Social Connections: Not on file  Intimate Partner Violence: Not on file        Ht '6\' 7"'$  (2.007 m)   BMI 25.87 kg/m    Physical Exam:   Well appearing NAD HEENT: Unremarkable Neck:  No JVD, no thyromegally Lymphatics:  No adenopathy Back:  No CVA tenderness Lungs:  Clear with no wheezes HEART:  Regular rate rhythm, no murmurs, no rubs, no clicks Abd:  soft, positive bowel sounds, no organomegally, no rebound, no guarding Ext:  2 plus pulses, no edema, no cyanosis, no clubbing Skin:  No rashes no nodules Neuro:  CN II through XII intact, motor grossly intact    EKG - nsr with AV pacing   DEVICE  Normal device function.  See PaceArt for details.    Assess/Plan:  Parox atrial flutter - I have discussed the treatment options with the patient and recommended EP study and ablation. He will call us if he wishes to proceed. I reviewed the risks/benefits/goals/expectations with the patient. ICD - his medtronic BIV ICD is working normally. Chronic systolic heart failure - he is on maximal GDMT under the direction of Dr. Aundra Dubin.    Salome Spotted  EP Attending  Patient seen and examined. Since prior clinic visit no change. He presents for flutter ablation. I have reviewed the indications/risks/benefits/goals/expectations and he wishes to proceed.  Carleene Overlie Shardai Star,MD

## 2022-06-27 NOTE — Progress Notes (Signed)
Patient and wife was given discharge instructions. Both verbalized understanding. 

## 2022-06-27 NOTE — Progress Notes (Signed)
Remote ICD transmission.   

## 2022-06-27 NOTE — Transfer of Care (Signed)
Immediate Anesthesia Transfer of Care Note  Patient: Justice Deeds  Procedure(s) Performed: A-FLUTTER ABLATION  Patient Location: PACU  Anesthesia Type:General  Level of Consciousness: awake, alert , and oriented  Airway & Oxygen Therapy: Patient Spontanous Breathing  Post-op Assessment: Report given to RN, Post -op Vital signs reviewed and stable, and Patient moving all extremities  Post vital signs: Reviewed and stable  Last Vitals:  Vitals Value Taken Time  BP 97/64 06/27/22 1132  Temp 36.8 C 06/27/22 1050  Pulse 70 06/27/22 1133  Resp 12 06/27/22 1133  SpO2 99 % 06/27/22 1133  Vitals shown include unvalidated device data.  Last Pain:  Vitals:   06/27/22 1026  TempSrc:   PainSc: 0-No pain      Patients Stated Pain Goal: 4 (02/26/87 7195)  Complications: There were no known notable events for this encounter.

## 2022-06-28 NOTE — Anesthesia Postprocedure Evaluation (Signed)
Anesthesia Post Note  Patient: [name]  Procedure(s) Performed: A-FLUTTER ABLATION     Patient location during evaluation: PACU Anesthesia Type: General Level of consciousness: awake and alert Pain management: pain level controlled Vital Signs Assessment: post-procedure vital signs reviewed and stable Respiratory status: spontaneous breathing, nonlabored ventilation, respiratory function stable and patient connected to nasal cannula oxygen Cardiovascular status: blood pressure returned to baseline and stable Postop Assessment: no apparent nausea or vomiting Anesthetic complications: no  There were no known notable events for this encounter.  Last Vitals:  Vitals:   06/27/22 1430 06/27/22 1520  BP: 95/60 92/60  Pulse: 69 69  Resp: 11 16  Temp:    SpO2: 97% 96%    Last Pain:  Vitals:   06/27/22 1345  TempSrc:   PainSc: 0-No pain                 Granvel Proudfoot

## 2022-07-04 ENCOUNTER — Encounter: Payer: Self-pay | Admitting: Internal Medicine

## 2022-07-05 ENCOUNTER — Other Ambulatory Visit: Payer: Self-pay | Admitting: Cardiovascular Disease

## 2022-07-20 NOTE — Therapy (Signed)
[facility] [address], [place], [place], [address] Phone: [phone]   Fax:  [phone]  Patient Details  Name: Ricardo Jones MRN: [id number] Date of Birth: 1971/06/06 Referring Provider:  Rosalin Hawking, MD  Encounter Date: 07/20/2022 SPEECH THERAPY DISCHARGE SUMMARY  Visits from Start of Care: 3  Current functional level related to goals / functional outcomes: Pt did not schedule more appointments following his third session October 11, 2021. It is assumed he does not want to pursue further ST at this time. Goals at that time are below:  SLP Short Term Goals - 10/11/21 1404                SLP SHORT TERM GOAL #1    Title pt will write senence level information with 80% success and rare min A in two sessions     Time 4     Period Weeks     Status On-going     Target Date 11/03/21          SLP SHORT TERM GOAL #2    Title pt will give sentence level descriptions with 90% success and modified independence in 2 sessions     Time 4     Period Weeks     Status On-going     Target Date 11/03/21                     SLP Long Term Goals - 10/11/21 1405                SLP LONG TERM GOAL #1    Title pt will write 3-4 sentence paragraphs with 90% success with nonverbal cues     Time 8     Period Weeks     Status On-going          SLP LONG TERM GOAL #2    Title pt will write 6-7 sentence paragraphs with 90% success and self correction in 3 sessions     Time 12     Period Weeks     Status On-going          SLP LONG TERM GOAL #3    Title pt will demo functional mod complex conversation with modified independence in 3 sessions     Time 8     Period Weeks     Status On-going          SLP LONG TERM GOAL #4    Title pt will engage in 15 minutes mod complex-complex conversation with modified independence in 3 sessions     Time 12     Period Weeks     Status On-going                     Plan - 10/11/21 1404        Clinical Impression Statement Pt presents today with mod verbal expression deficit, and mod-severe written language deficit. Pt would cont to benefit from skilled ST targeting language expression (verbal and written).        Remaining deficits: As SLP has not made contact with pt after self-discharge, unknown.   Education / Equipment: See above.   Patient agrees to discharge. Patient goals were not met. Patient is being discharged due to not returning since the last visit.. (And likely being satisfied with current level)     [place], [place] 07/20/2022, 10:12 AM  [facility] [address], [place], [place], [address] Phone: [phone]   Fax:  [phone]

## 2022-08-02 ENCOUNTER — Ambulatory Visit: Payer: BC Managed Care – PPO | Attending: Internal Medicine | Admitting: Internal Medicine

## 2022-08-02 VITALS — BP 102/60 | HR 78 | Ht 79.0 in | Wt 213.0 lb

## 2022-08-02 DIAGNOSIS — Z95810 Presence of automatic (implantable) cardiac defibrillator: Secondary | ICD-10-CM

## 2022-08-02 DIAGNOSIS — I442 Atrioventricular block, complete: Secondary | ICD-10-CM | POA: Diagnosis not present

## 2022-08-02 DIAGNOSIS — I484 Atypical atrial flutter: Secondary | ICD-10-CM

## 2022-08-02 DIAGNOSIS — I425 Other restrictive cardiomyopathy: Secondary | ICD-10-CM

## 2022-08-02 LAB — CUP PACEART INCLINIC DEVICE CHECK
Date Time Interrogation Session: 20240118170119
Implantable Lead Connection Status: 753985
Implantable Lead Connection Status: 753985
Implantable Lead Connection Status: 753985
Implantable Lead Implant Date: 20200224
Implantable Lead Implant Date: 20200224
Implantable Lead Implant Date: 20200224
Implantable Lead Location: 753858
Implantable Lead Location: 753859
Implantable Lead Location: 753860
Implantable Lead Model: 5076
Implantable Lead Model: 6935
Implantable Pulse Generator Implant Date: 20200224

## 2022-08-02 NOTE — Progress Notes (Signed)
HPI Ricardo Jones returns today for ep eval. 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 He has had recurrent atrial flutter with a CVR. He underwent EP study several weeks ago where he was found to have simultaneousl right and left atrial flutter. The RA flutter was non-isthmus dependent. He did not undergo ablation. He has done well in the interim. He denies chest pain or sob and he is exercising regularly without difficulty. No Known Allergies   Current Outpatient Medications  Medication Sig Dispense Refill   apixaban (ELIQUIS) 5 MG TABS tablet Take 1 tablet (5 mg total) by mouth 2 (two) times daily. 180 tablet 3   atorvastatin (LIPITOR) 40 MG tablet Take 1 tablet (40 mg total) by mouth daily. 90 tablet 3   bisoprolol (ZEBETA) 5 MG tablet Take 0.5 tablets (2.5 mg total) by mouth daily. (Patient taking differently: Take 5 mg by mouth daily.)     dapagliflozin propanediol (FARXIGA) 10 MG TABS tablet Take 1 tablet (10 mg total) by mouth daily before breakfast. 30 tablet 11   fluconazole (DIFLUCAN) 200 MG tablet Take 200 mg by mouth once a week.     spironolactone (ALDACTONE) 25 MG tablet Take 1 tablet (25 mg total) by mouth daily. 90 tablet 3   valsartan (DIOVAN) 40 MG tablet Take 0.5 tablets (20 mg total) by mouth daily. 45 tablet 3   No current facility-administered medications for this visit.     Past Medical History:  Diagnosis Date   Acute right MCA stroke ([place]) 09/25/2021   s/p thrombectomy   Bradycardia    asymptomatic   Chronic systolic CHF (congestive heart failure), NYHA class 2 (HCC)    Complete heart block (HCC)    s/p CRT-D   Hyperlipidemia LDL goal <100    S/P dilatation of esophageal stricture    Typical atrial flutter (HCC)     ROS:   All systems reviewed and negative except as noted in the HPI.   Past Surgical History:  Procedure Laterality Date    A-FLUTTER ABLATION N/A 06/27/2022   Procedure: A-FLUTTER ABLATION;  Surgeon: Evans Lance, MD;  Location: [place] [facility];  Service: Cardiovascular;  Laterality: N/A;   BIV ICD INSERTION CRT-D N/A 09/08/2018   Procedure: BIV ICD INSERTION CRT-D;  Surgeon: Evans Lance, MD;  Location: [place] [facility];  Service: Cardiovascular;  Laterality: N/A;   IR CT HEAD LTD  09/25/2021   IR PERCUTANEOUS ART THROMBECTOMY/INFUSION INTRACRANIAL INC DIAG ANGIO  09/25/2021   RADIOLOGY WITH ANESTHESIA N/A 09/25/2021   Procedure: IR WITH ANESTHESIA;  Surgeon: Luanne Bras, MD;  Location: [facility];  Service: Radiology;  Laterality: N/A;   THROAT SURGERY  2012     Family History  Problem Relation Age of Onset   Asthma Mother    Cancer Maternal Grandmother    Cancer Maternal Grandfather    Cancer Paternal Grandmother    Heart failure Maternal Uncle    Bradycardia Other        multiple family members with pacemakers   Heart failure Other      Social History   Socioeconomic History   Marital status: Single    Spouse name: Not on file   Number of children: Not on file   Years of education: Not on file   Highest education level: Not on file  Occupational History   Not  on file  Tobacco Use   Smoking status: Never   Smokeless tobacco: Never  Vaping Use   Vaping Use: Never used  Substance and Sexual Activity   Alcohol use: Yes    Comment: occassionally   Drug use: No   Sexual activity: Not on file  Other Topics Concern   Not on file  Social History Narrative   Works as a Scientist, 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[employer], previously an Arboriculturist   Social Determinants of Radio broadcast assistant Strain: Not on Art therapist Insecurity: Not on file  Transportation Needs: Not on file  Physical Activity: Not on file  Stress: Not on file  Social Connections: Not on file  Intimate Partner Violence: Not on file     BP 102/60   Pulse 78   Ht '6\' 7"'$  (2.007 m)   Wt 213 lb (96.6 kg)   SpO2 97%    BMI 24.00 kg/m   Physical Exam:  Well appearing NAD HEENT: Unremarkable Neck:  No JVD, no thyromegally Lymphatics:  No adenopathy Back:  No CVA tenderness Lungs:  Clear HEART:  Regular rate rhythm, no murmurs, no rubs, no clicks Abd:  soft, positive bowel sounds, no organomegally, no rebound, no guarding Ext:  2 plus pulses, no edema, no cyanosis, no clubbing Skin:  No rashes no nodules Neuro:  CN II through XII intact, motor grossly intact  EKG - NSR with AV pacing  DEVICE  Normal device function.  See PaceArt for details.   Assess/Plan: Parox atrial flutter - I have discussed the findings on the EP study with the patient. He has non-isthmus dependent flutter with extensive scar tissue. He did not undergo ablation. He will undergo watchful waiting as he has maintained NSR 99% of the time. ICD - his medtronic BIV ICD is working normally. Chronic systolic heart failure - he is on maximal GDMT under the direction of Dr. Aundra Dubin.    Ricardo Overlie Stefon Ramthun,MD

## 2022-08-02 NOTE — Patient Instructions (Addendum)
Medication Instructions:  Your physician recommends that you continue on your current medications as directed. Please refer to the Current Medication list given to you today.  *If you need a refill on your cardiac medications before your next appointment, please call your pharmacy*  Lab Work: None ordered.  If you have labs (blood work) drawn today and your tests are completely normal, you will receive your results only by: [place] (if you have MyChart) OR A paper copy in the mail If you have any lab test that is abnormal or we need to change your treatment, we will call you to review the results.  Testing/Procedures: None ordered.  Follow-Up: At [facility], you and your health needs are our priority.  As part of our continuing mission to provide you with exceptional heart care, we have created designated Provider Care Teams.  These Care Teams include your primary Cardiologist (physician) and Advanced Practice Providers (APPs -  Physician Assistants and Nurse Practitioners) who all work together to provide you with the care you need, when you need it.  We recommend signing up for the patient portal called "MyChart".  Sign up information is provided on this After Visit Summary.  MyChart is used to connect with patients for Virtual Visits (Telemedicine).  Patients are able to view lab/test results, encounter notes, upcoming appointments, etc.  Non-urgent messages can be sent to your provider as well.   To learn more about what you can do with MyChart, go to [email or web address].    Your next appointment:   Please schedule a follow up appointment with Dr. Virl Axe in June 2024.   You will follow up with Dr. Cristopher Peru AS NEEDED.  The format for your next appointment:   In Person  Provider:   Cristopher Peru, MD{or one of the following Advanced Practice Providers on your designated Care Team:   Tommye Standard, [place] Legrand Como "Jonni Sanger" Chalmers Cater, [place]  Remote monitoring is used  to monitor your ICD from home. This monitoring reduces the number of office visits required to check your device to one time per year. It allows [place] to keep an eye on the functioning of your device to ensure it is working properly. You are scheduled for a device check from home on 09/05/2022. You may send your transmission at any time that day. If you have a wireless device, the transmission will be sent automatically. After your physician reviews your transmission, you will receive a postcard with your next transmission date.  Important Information About [place]

## 2022-08-07 ENCOUNTER — Encounter (HOSPITAL_COMMUNITY): Payer: Self-pay | Admitting: Cardiology

## 2022-08-07 ENCOUNTER — Ambulatory Visit (HOSPITAL_COMMUNITY)
Admission: RE | Admit: 2022-08-07 | Discharge: 2022-08-07 | Disposition: A | Discharge status: Home / Self Care | Payer: BC Managed Care – PPO | Source: Ambulatory Visit | Attending: Cardiology | Admitting: Cardiology

## 2022-08-07 VITALS — BP 94/50 | HR 72 | Wt 216.8 lb

## 2022-08-07 DIAGNOSIS — Z7901 Long term (current) use of anticoagulants: Secondary | ICD-10-CM | POA: Insufficient documentation

## 2022-08-07 DIAGNOSIS — I5022 Chronic systolic (congestive) heart failure: Secondary | ICD-10-CM | POA: Insufficient documentation

## 2022-08-07 DIAGNOSIS — Z8673 Personal history of transient ischemic attack (TIA), and cerebral infarction without residual deficits: Secondary | ICD-10-CM | POA: Insufficient documentation

## 2022-08-07 DIAGNOSIS — Z79899 Other long term (current) drug therapy: Secondary | ICD-10-CM | POA: Insufficient documentation

## 2022-08-07 DIAGNOSIS — I428 Other cardiomyopathies: Secondary | ICD-10-CM | POA: Insufficient documentation

## 2022-08-07 DIAGNOSIS — Z95810 Presence of automatic (implantable) cardiac defibrillator: Secondary | ICD-10-CM | POA: Insufficient documentation

## 2022-08-07 DIAGNOSIS — Z8249 Family history of ischemic heart disease and other diseases of the circulatory system: Secondary | ICD-10-CM | POA: Diagnosis not present

## 2022-08-07 DIAGNOSIS — I442 Atrioventricular block, complete: Secondary | ICD-10-CM | POA: Diagnosis not present

## 2022-08-07 DIAGNOSIS — I4892 Unspecified atrial flutter: Secondary | ICD-10-CM | POA: Diagnosis not present

## 2022-08-07 DIAGNOSIS — I110 Hypertensive heart disease with heart failure: Secondary | ICD-10-CM | POA: Diagnosis not present

## 2022-08-07 LAB — BASIC METABOLIC PANEL
Anion gap: 6 (ref 5–15)
BUN: 18 mg/dL (ref 6–20)
CO2: 26 mmol/L (ref 22–32)
Calcium: 9.5 mg/dL (ref 8.9–10.3)
Chloride: 103 mmol/L (ref 98–111)
Creatinine, Ser: 0.89 mg/dL (ref 0.61–1.24)
GFR, Estimated: >60 mL/min (ref >60)
Glucose, Bld: 90 mg/dL (ref 70–99)
Potassium: 4.9 mmol/L (ref 3.5–5.1)
Sodium: 135 mmol/L (ref 135–145)

## 2022-08-07 LAB — CBC
HCT: 43.2 % (ref 39.0–52.0)
Hemoglobin: 14.9 g/dL (ref 13.0–17.0)
MCH: 30.3 pg (ref 26.0–34.0)
MCHC: 34.5 g/dL (ref 30.0–36.0)
MCV: 88 fL (ref 80.0–100.0)
Platelets: 165 10*3/uL (ref 150–400)
RBC: 4.91 MIL/uL (ref 4.22–5.81)
RDW: 12.5 % (ref 11.5–15.5)
WBC: 5.8 10*3/uL (ref 4.0–10.5)
nRBC: 0 % (ref 0.0–0.2)

## 2022-08-07 LAB — BRAIN NATRIURETIC PEPTIDE: B Natriuretic Peptide: 61.1 pg/mL (ref 0.0–100.0)

## 2022-08-07 MED ORDER — BISOPROLOL FUMARATE 5 MG PO TABS: 5.0000 mg | ORAL_TABLET | Freq: Every day | ORAL | 3 refills | Status: DC

## 2022-08-07 NOTE — Patient Instructions (Addendum)
INCREASE Bisoprolol to 5 mg daily.  Labs done today, your results will be available in MyChart, we will contact you for abnormal readings.  Your physician has requested that you have an echocardiogram. Echocardiography is a painless test that uses sound waves to create images of your heart. It provides your doctor with information about the size and shape of your heart and how well your heart's chambers and valves are working. This procedure takes approximately one hour. There are no restrictions for this procedure. Please do NOT wear cologne, perfume, aftershave, or lotions (deodorant is allowed). Please arrive 15 minutes prior to your appointment time. YOU WILL BE CALLED TO HAVE THIS TEST ARRANGED    Your physician recommends that you schedule a follow-up appointment in: 4 months ( May 2024)  ** please call the office in March to arrange your follow up appointment**  If you have any questions or concerns before your next appointment please send [place] a message through [place] or call our office at [phone].    TO LEAVE A MESSAGE FOR THE NURSE SELECT OPTION 2, PLEASE LEAVE A MESSAGE INCLUDING: YOUR NAME DATE OF BIRTH CALL BACK NUMBER REASON FOR CALL**this is important as we prioritize the call backs  YOU WILL RECEIVE A CALL BACK THE SAME DAY AS LONG AS YOU CALL BEFORE 4:00 PM  At the [facility], you and your health needs are our priority. As part of our continuing mission to provide you with exceptional heart care, we have created designated Provider Care Teams. These Care Teams include your primary Cardiologist (physician) and Advanced Practice Providers (APPs- Physician Assistants and Nurse Practitioners) who all work together to provide you with the care you need, when you need it.   You may see any of the following providers on your designated Care Team at your next follow up: Dr Glori Bickers Dr Loralie Champagne Dr. Roxana Hires, NP Lyda Jester, [place] [facility] [place], [place] Forestine Na, NP Audry Riles, PharmD   Please be sure to bring in all your medications bottles to every appointment.

## 2022-08-07 NOTE — Progress Notes (Signed)
Primary Care: Ricardo Amel, MD HF Cardiologist: Dr. Aundra Dubin EP: Dr. Caryl Comes  HPI: 52 y.o. male w/ chronic systolic heart failure, CHB and VT s/p Medtronic CRT-D, atrial flutter and CVA.   He has a strong family history of cardiomyopathy.  Uncle with heart transplant, 2 cousins with pacemakers. His mother has had genetic testing and was LMNA gene mutation positive.  He was initially followed by Dr. Rayann Heman and Dr. Acie Fredrickson for atrial flutter and bradycardia. Declined atrial flutter ablation in the past. 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 Subsequently was referred for cMRI and PET scan in 2019. cMRI showed non-coronary pattern LGE suspicious for infiltrative disease. PET scan at [facility] showed abnormal FDG uptake consistent with inflamatory process of the myocardium, most especially involving the basal inferior, basal inferoseptal and mid inferoseptal walls. This was also concerning for possible sarcoid; however he had chest CT that showed no pulmonary evidence for sarcoid. Given no definitive diagnosis, pt declined empiric immunosuppressive treatment, but did undergo Medtronic CRT-D placement in 2/20 by Dr. Lovena Le. He has since been followed primarily by Dr. Caryl Comes.   Echo 01/2014 EF 50-55%, RV normal  Echo 11/2016 EF 40-45%, RV normal  Echo 04/2018 EF 40-45%, RV normal  Echo 02/2019 EF 50-55%, RV normal  Echo 07/2021 showed mildly reduced LVEF, 45-50%, normal RV. Moderate BAE.   Admitted to [facility] 09/25/21 w/ right MCA infarct due to right M1 occlusion, likely secondary to atrial flutter and cardiomyopathy. Unfortunately, had not been on anticoagulation prior to this. He underwent successful mechanical thrombectomy by IR. Patient 's symptoms improved rapidly.  Cardiology was consulted as TTE showed EF of 30-35%, mild RV dilation, severe biatrial enlargement.  He was placed on limited medical therapy to allow for permissive hypertension following stroke. Started  on Coreg 3.15 mg bid. Eliquis was started for atrial flutter. Atorvastatin added (LDL 100 mg/dL). Hgb A1c 6.0. Baseline SCr < 1.0. Discharged home on 09/28/21. Referred to HF clinic.   When I saw him initially, I obtained genetic testing.  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   CPX in 9/23 showed mildly decreased functional capacity.   Patient had EP study for possible atrial flutter ablation in 12/23.  This showed simultaneous left and right atrial flutters.  Due to extensive scar tissue in atria, flutter ablation was not done and patient was cardioverted to NSR.   He returns today for followup of CHF.  He has been doing well recently, no complaints.  He exercises at the gym with weights and on the elliptical. No significant exertional dyspnea.  No lightheadedness though SBP runs 90s-100s.  No chest pain.  No orthopnea/PND.  No palpitations.  He is in NSR today.  No BRBPR/melena.   MDT device interrogation: Stable thoracic impedance, >99% BiV pacing, only rare short atrial arrhythmias.   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  Medtronic CRT-D device.  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  - Echo 01/2014 EF 50-55%, RV normal  - Echo 11/2016 EF 40-45%, RV normal  - Echo 04/2018 EF 40-45%, RV normal  - Cardiac MRI (11/19): LV EF 44%, RV EF 62%, patchy mid-wall LGE in the basal septal and lateral walls.  -  CPET (12/19, [facility]): abnormal FDG uptake consistent with inflammatory process of the myocardium. More especially involving the basal-inferior, Basal inferoseptal and mid inferoseptal walls. In the proper clinical settings this may represent active sarcoidosis in the myocardium. - Echo 02/2019 EF 50-55%, RV normal  - Echo 07/2021  showed mildly reduced LVEF, 45-50%, normal RV. Moderate BAE.  - Echo 3/23 EF 30-35%, mild RV dilation, severe biatrial enlargement. - CPX (9/23): Peak VO2 23, VE/VCO2 slope 30, RER 1.1.  Mildly decreased functional capacity.  2. 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  3. Atrial flutter: Paroxysmal.  -  EP study for possible atrial flutter ablation in 12/23.  This showed simultaneous left and right atrial flutters.  Due to extensive scar tissue in atria, flutter ablation was not done and patient was cardioverted to NSR.  4. CVA: Admitted to [facility] 09/25/21 w/ right MCA infarct due to right M1 occlusion, likely secondary to atrial flutter.  5. H/o VT  Review of Systems: All systems reviewed and negative except as per HPI.   Current Outpatient Medications  Medication Sig Dispense Refill   apixaban (ELIQUIS) 5 MG TABS tablet Take 1 tablet (5 mg total) by mouth 2 (two) times daily. 180 tablet 3   atorvastatin (LIPITOR) 40 MG tablet Take 1 tablet (40 mg total) by mouth daily. 90 tablet 3   dapagliflozin propanediol (FARXIGA) 10 MG TABS tablet Take 1 tablet (10 mg total) by mouth daily before breakfast. 30 tablet 11   fluconazole (DIFLUCAN) 200 MG tablet Take 200 mg by mouth once a week.     spironolactone (ALDACTONE) 25 MG tablet Take 1 tablet (25 mg total) by mouth daily. 90 tablet 3   valsartan (DIOVAN) 40 MG tablet Take 40 mg by mouth daily.     bisoprolol (ZEBETA) 5 MG tablet Take 1 tablet (5 mg total) by mouth daily. 90 tablet 3   No current facility-administered medications for this encounter.    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[employer], previously an Arboriculturist   Social Determinants of Radio broadcast assistant Strain: Not on Art therapist Insecurity: Not on file  Transportation Needs: Not on file  Physical Activity: Not on file  Stress: Not on file  Social Connections: Not on file  Intimate Partner Violence: Not on file      Family History  Problem Relation Age of Onset   Asthma Mother    Cancer Maternal Grandmother    Cancer Maternal Grandfather    Cancer Paternal Grandmother    Heart failure Maternal Uncle    Bradycardia Other        multiple family members with pacemakers   Heart failure Maternal Uncle s/p Cardiac Transplant   Maternal Cousin s/p Cardiac Transplant       Vitals:  08/07/22 1543  BP: (!) 94/50  Pulse: 72  SpO2: 99%  Weight: 98.3 kg (216 lb 12.8 oz)    PHYSICAL EXAM: General: NAD Neck: No JVD, no thyromegaly or thyroid nodule.  Lungs: Clear to auscultation bilaterally with normal respiratory effort. CV: Nondisplaced PMI.  Heart regular S1/S2, no S3/S4, no murmur.  No peripheral edema.  No carotid bruit.  Normal pedal pulses.  Abdomen: Soft, nontender, no hepatosplenomegaly, no distention.  Skin: Intact without lesions or rashes.  Neurologic: Alert and oriented x 3.  Psych: Normal affect. Extremities: No clubbing or cyanosis.  HEENT: Normal.   ASSESSMENT & PLAN:  1. 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  Patient has had DCM associated with CHB and with atrial flutter.  Strong family history of cardiomyopathy and conduction disease/pacemaker placement.  Patient's mother also has LMNA gene mutation.  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 Patient has MDT CRT-D.  cMRI in 11/19 showed LV EF 44%, RV EF 62%, patchy mid-wall LGE in the basal septal and lateral walls.  cPET in 12/19 showed abnormal uptake in myocardium concerning for inflammatory process.  There was initial concern for cardiac sarcoidosis as a unifying diagnosis, but no sign of pulmonary sarcoidosis on CT chest and patient is positive for LMNA gene mutation.  The cMRI and cPET patterns that the patient had can be seen with genetic cardiomyopathies, often appearing to masquerade as cardiac sarcoidosis.  I think cardiac sarcoidosis is unlikely and would not immunosuppress.  Most recent echo in 3/23 showed EF 30-35%, mild RV dilation. CPX in 9/23 showed mildly decreased functional capacity.  NYHA class I-II symptoms, he seems to be doing quite well.  Not volume overloaded by exam or Optivol. He has struggled to tolerate GDMT, SBP generally runs in 90s-100s, but seems to be tolerating current regimen well.  - I will try increasing his bisoprolol to 5 mg qhs.   - Off Entresto due to orthostasis.  Continue valsartan 40 mg qhs.  - Continue spironolactone 25 mg daily. BMET/BNP today.  - Continue Farxiga 10 mg daily.   - I will arrange for followup echo.  2. Paroxysmal Atrial Flutter: Dates back to at least 2012.  Flutter ablation attempt in 12/23 with Dr. Lovena Le showed simultaneous left and right atrial flutters.  Due to extensive scar tissue in atria, flutter ablation was not done and patient was cardioverted to NSR. Device interrogation shows rare short atrial arrhythmias.  - Long-term Eliquis with CVA history.   3. Rt MCA CVA: Recent MCA infarct due to right M1 occlusion 3/23. Likely secondary to atrial flutter and cardiomyopathy (LVEF 30-35%). Had not been anticoagulated. s/p mechanical thrombectomy by IR - Continue Eliquis 5 mg bid.  - Continue statin.  4. CHB: MDT CRT-D.  >99% BiV paced on device interrogation. 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 H/o VT: Has ICD, followed by Dr. Caryl Comes  - no recent VT on device interrogation   Followup 4 months   Loralie Champagne 08/07/2022

## 2022-08-23 ENCOUNTER — Other Ambulatory Visit (HOSPITAL_COMMUNITY): Payer: Self-pay | Admitting: Cardiology

## 2022-09-05 ENCOUNTER — Ambulatory Visit (INDEPENDENT_AMBULATORY_CARE_PROVIDER_SITE_OTHER): Payer: Self-pay

## 2022-09-05 DIAGNOSIS — I428 Other cardiomyopathies: Secondary | ICD-10-CM

## 2022-09-05 LAB — CUP PACEART REMOTE DEVICE CHECK
Battery Remaining Longevity: 27 mo
Battery Voltage: 2.94 V
Brady Statistic AP VP Percent: 99.91 %
Brady Statistic AP VS Percent: 0 %
Brady Statistic AS VP Percent: 0.09 %
Brady Statistic AS VS Percent: 0 %
Brady Statistic RA Percent Paced: 99.85 %
Brady Statistic RV Percent Paced: 98.33 %
Date Time Interrogation Session: 20240221012203
HighPow Impedance: 75 Ohm
Implantable Lead Connection Status: 753985
Implantable Lead Connection Status: 753985
Implantable Lead Connection Status: 753985
Implantable Lead Implant Date: 20200224
Implantable Lead Implant Date: 20200224
Implantable Lead Implant Date: 20200224
Implantable Lead Location: 753858
Implantable Lead Location: 753859
Implantable Lead Location: 753860
Implantable Lead Model: 5076
Implantable Lead Model: 6935
Implantable Pulse Generator Implant Date: 20200224
Lead Channel Impedance Value: 245.538
Lead Channel Impedance Value: 245.538
Lead Channel Impedance Value: 253.333
Lead Channel Impedance Value: 266 Ohm
Lead Channel Impedance Value: 275.172
Lead Channel Impedance Value: 304 Ohm
Lead Channel Impedance Value: 380 Ohm
Lead Channel Impedance Value: 456 Ohm
Lead Channel Impedance Value: 456 Ohm
Lead Channel Impedance Value: 532 Ohm
Lead Channel Impedance Value: 532 Ohm
Lead Channel Impedance Value: 570 Ohm
Lead Channel Impedance Value: 893 Ohm
Lead Channel Impedance Value: 893 Ohm
Lead Channel Impedance Value: 950 Ohm
Lead Channel Impedance Value: 950 Ohm
Lead Channel Impedance Value: 969 Ohm
Lead Channel Impedance Value: 969 Ohm
Lead Channel Pacing Threshold Amplitude: 0.875 V
Lead Channel Pacing Threshold Amplitude: 0.875 V
Lead Channel Pacing Threshold Pulse Width: 0.4 ms
Lead Channel Pacing Threshold Pulse Width: 0.4 ms
Lead Channel Sensing Intrinsic Amplitude: 0.625 mV
Lead Channel Sensing Intrinsic Amplitude: 0.625 mV
Lead Channel Sensing Intrinsic Amplitude: 6.875 mV
Lead Channel Sensing Intrinsic Amplitude: 6.875 mV
Lead Channel Setting Pacing Amplitude: 1.5 V
Lead Channel Setting Pacing Amplitude: 2 V
Lead Channel Setting Pacing Amplitude: 2.5 V
Lead Channel Setting Pacing Pulse Width: 0.4 ms
Lead Channel Setting Pacing Pulse Width: 0.6 ms
Lead Channel Setting Sensing Sensitivity: 0.3 mV
Zone Setting Status: 755011
Zone Setting Status: 755011

## 2022-09-19 NOTE — Addendum Note (Signed)
Addended by: Janan Halter F on: 09/19/2022 08:23 PM   Modules accepted: Orders

## 2022-09-20 ENCOUNTER — Other Ambulatory Visit (INDEPENDENT_AMBULATORY_CARE_PROVIDER_SITE_OTHER): Payer: BC Managed Care – PPO | Admitting: *Deleted

## 2022-09-20 DIAGNOSIS — I5022 Chronic systolic (congestive) heart failure: Secondary | ICD-10-CM | POA: Diagnosis not present

## 2022-09-20 NOTE — Addendum Note (Signed)
Addended by: Gaetano Net on: 09/20/2022 10:29 AM   Modules accepted: Orders

## 2022-09-21 ENCOUNTER — Ambulatory Visit: Payer: BC Managed Care – PPO | Attending: Cardiology

## 2022-09-21 DIAGNOSIS — I5022 Chronic systolic (congestive) heart failure: Secondary | ICD-10-CM

## 2022-09-22 LAB — ECHOCARDIOGRAM COMPLETE
Area-P 1/2: 5.02 cm2
Calc EF: 47.6 %
S' Lateral: 4.3 cm
Single Plane A2C EF: 48.5 %
Single Plane A4C EF: 46.6 %

## 2022-09-26 ENCOUNTER — Encounter (HOSPITAL_COMMUNITY): Payer: Self-pay

## 2022-10-03 NOTE — Progress Notes (Signed)
Remote ICD transmission.   

## 2022-10-08 ENCOUNTER — Other Ambulatory Visit (HOSPITAL_COMMUNITY): Payer: Self-pay | Admitting: Cardiology

## 2022-10-24 ENCOUNTER — Other Ambulatory Visit (HOSPITAL_COMMUNITY): Payer: Self-pay

## 2022-10-24 ENCOUNTER — Other Ambulatory Visit (HOSPITAL_COMMUNITY): Payer: Self-pay | Admitting: Cardiology

## 2022-10-24 MED ORDER — VALSARTAN 40 MG PO TABS: 40.0000 mg | ORAL_TABLET | Freq: Every day | ORAL | 0 refills | Status: DC

## 2022-10-24 MED ORDER — BISOPROLOL FUMARATE 5 MG PO TABS: 5.0000 mg | ORAL_TABLET | Freq: Every day | ORAL | 0 refills | Status: DC

## 2022-11-05 ENCOUNTER — Telehealth: Payer: Self-pay | Admitting: Neurology

## 2022-11-05 NOTE — Telephone Encounter (Signed)
LVM and sent mychart msg informing pt of need to reschedule 12/11/22 appointment - MD out 

## 2022-11-15 IMAGING — DX DG CHEST 1V PORT
1 series · 1 of 1 positions shown · non-contrast
Comparison: 09/09/2018

CLINICAL DATA: Endotracheal tube placement

EXAM:
PORTABLE CHEST 1 VIEW

[chest ap]
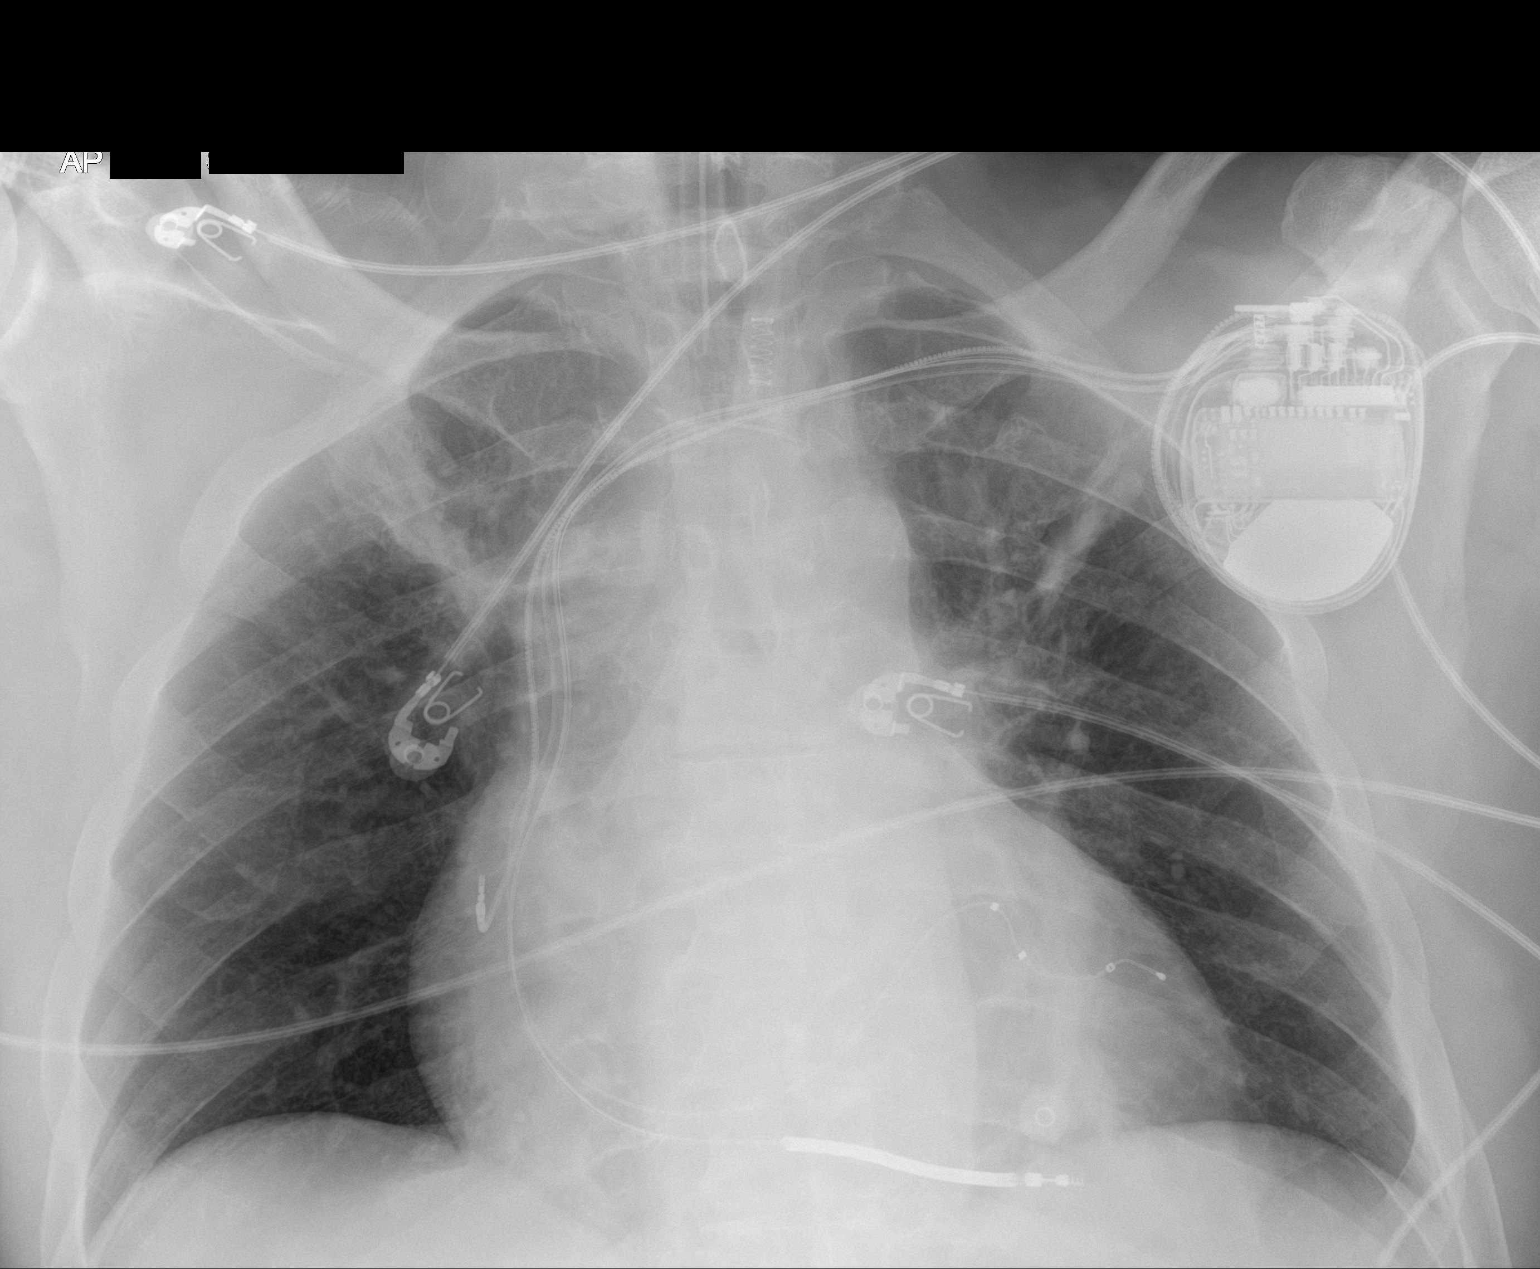

[1 of 1 positions shown; findings below may reference images not displayed]

FINDINGS: Endotracheal tube has been placed with tip measuring 5 cm above the
carina. A cardiac pacemaker is present. Cardiac enlargement without
vascular congestion or edema. Linear atelectasis or infiltration
demonstrated in both upper lungs. No pleural effusions. No
pneumothorax.
IMPRESSION: Endotracheal tube appears in satisfactory position measuring 5 cm
above the carina. Cardiac enlargement. Linear areas of atelectasis
or infiltration in the upper lungs.

## 2022-11-15 IMAGING — CT CT HEAD CODE STROKE
3 series · 15 of 47 positions shown, 18 images · non-contrast
Comparison: None.

CLINICAL DATA: Code stroke.  Acute neurologic deficit



[Series 3: head 5.0 st · axial · 0.46mm/px · z∈[-103,+42]mm · 9 of 35 slices shown, 12 images]
[im 3/35  brain]
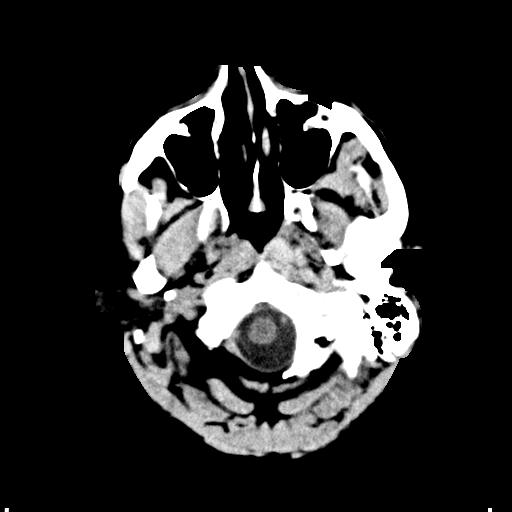
[im 3/35  bone]
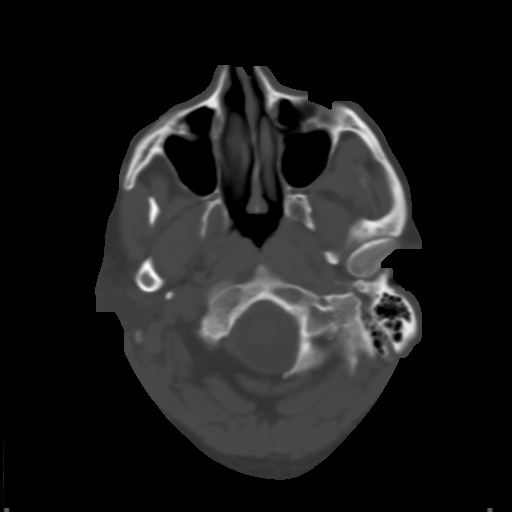
[im 6/35  brain]
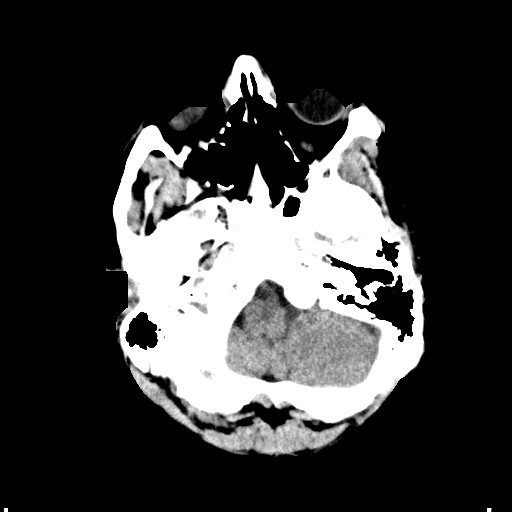
[im 10/35  brain]
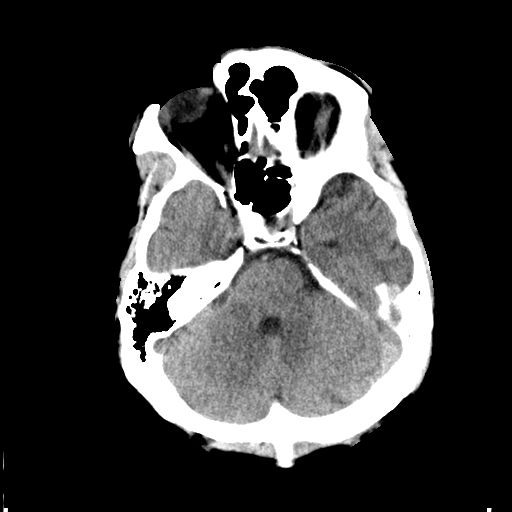
[im 13/35  brain]
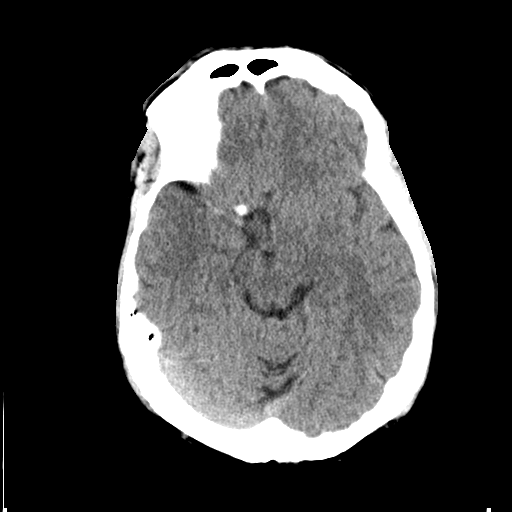
[im 18/35  brain]
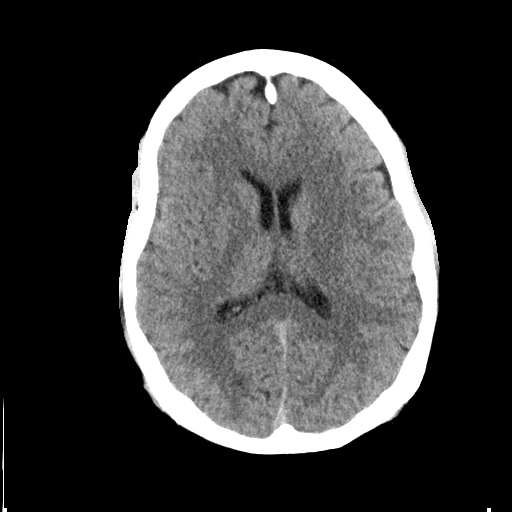
[im 18/35  bone]
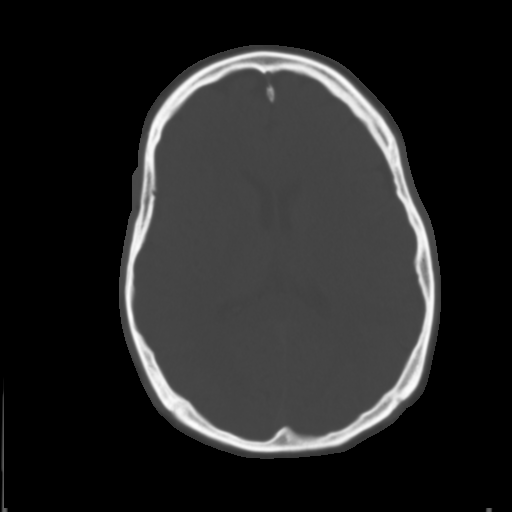
[im 22/35  brain]
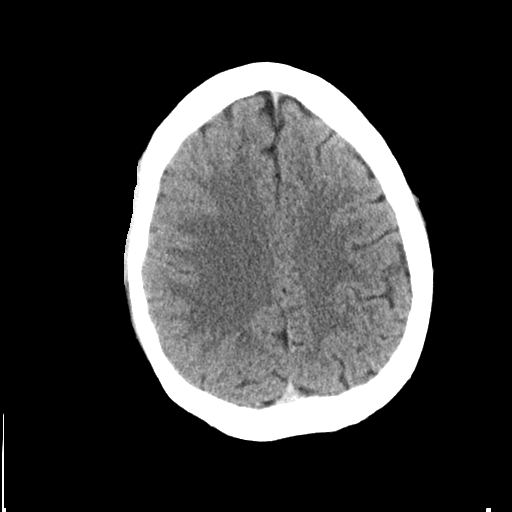
[im 25/35  brain]
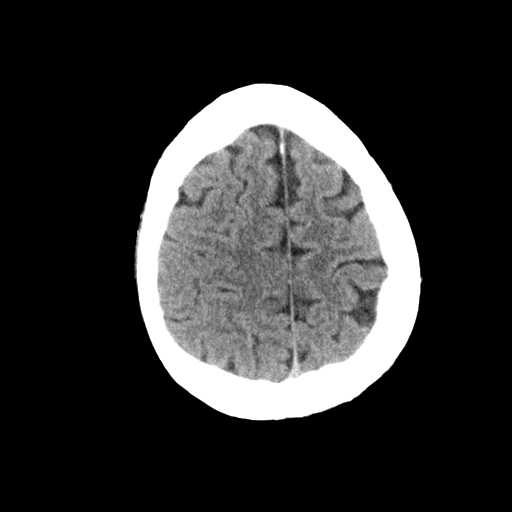
[im 29/35  brain]
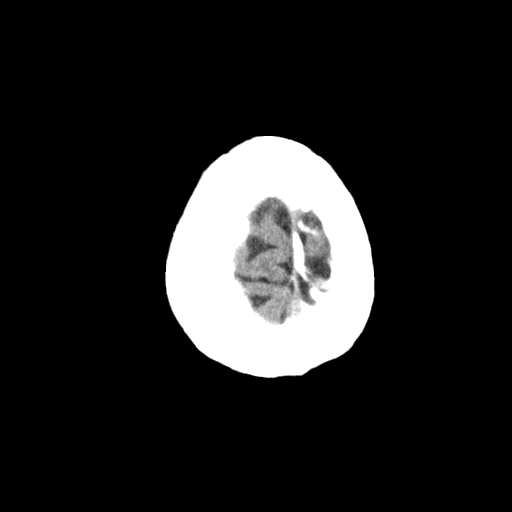
[im 32/35  brain]
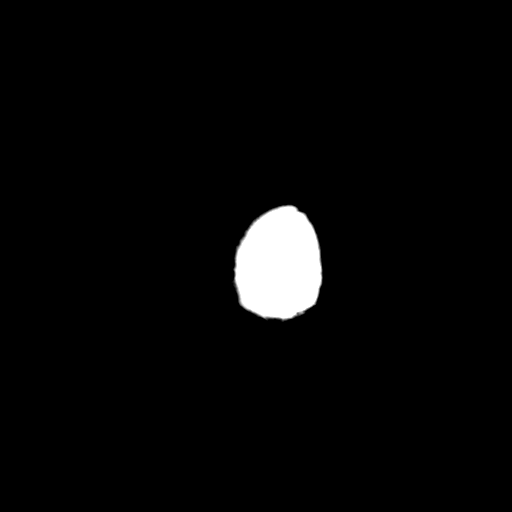
[im 32/35  bone]
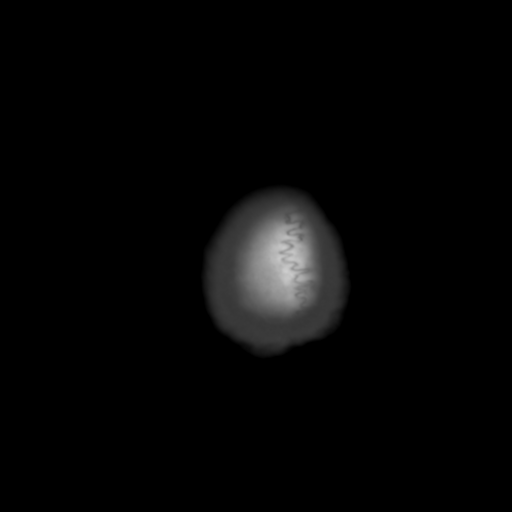

[Series 5: head 3.0 cor st · coronal · 0.34mm/px · 3 of 72 slices shown]
[im 26/72  brain]
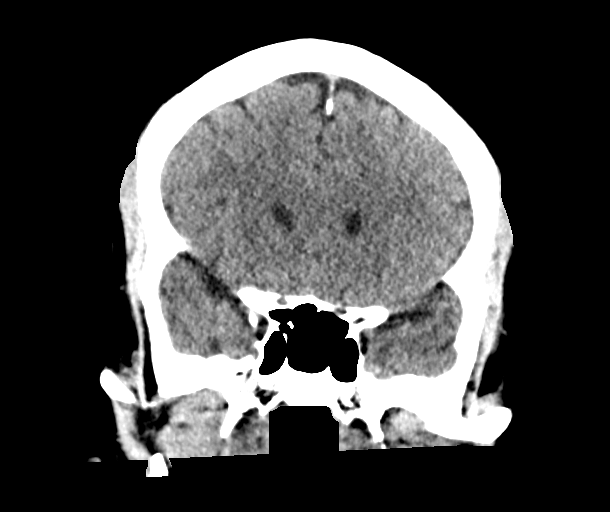
[im 33/72  brain]
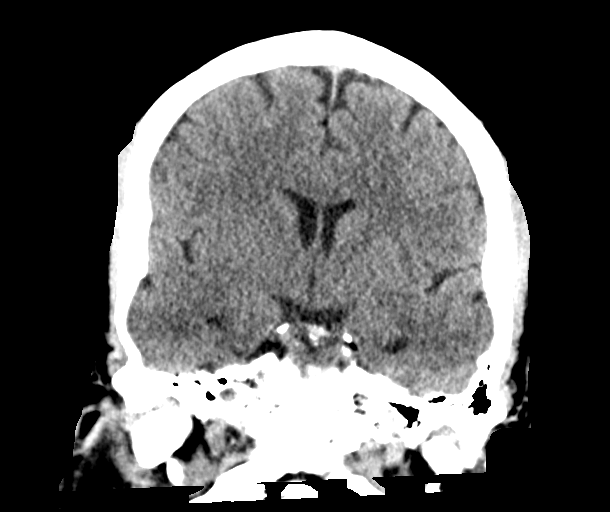
[im 40/72  brain]
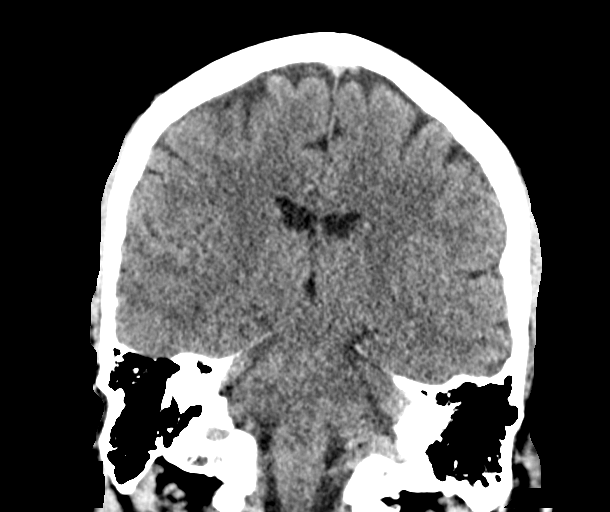

[Series 6: head 3.0 sag st · sagittal · 0.38mm/px · 3 of 67 slices shown]
[im 23/67  brain]
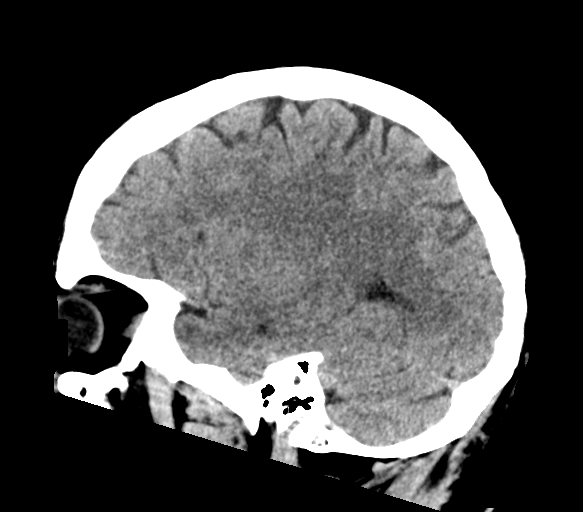
[im 34/67  brain]
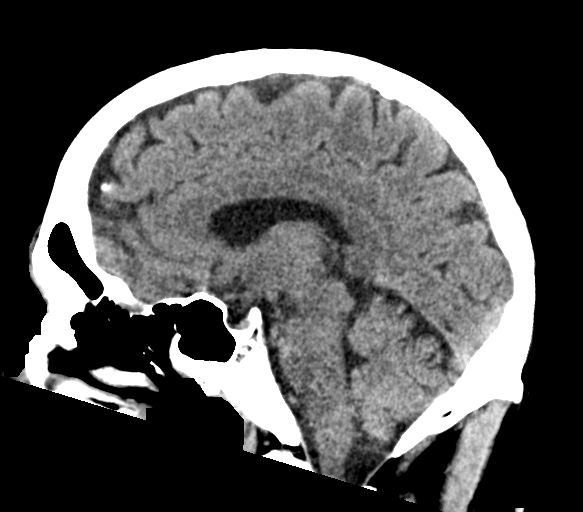
[im 45/67  brain]
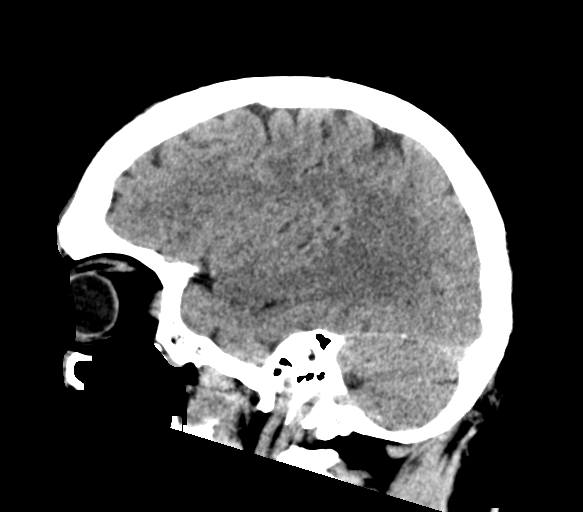

[15 of 47 positions shown; findings below may reference images not displayed]

FINDINGS: Brain: There is no mass, hemorrhage or extra-axial collection. The
size and configuration of the ventricles and extra-axial CSF spaces
are normal. The brain parenchyma is normal, without evidence of
acute or chronic infarction. Areas of hypoattenuation in the right
temporal and parietal lobes are likely due to beam hardening
artifacts.

Vascular: No abnormal hyperdensity of the major intracranial
arteries or dural venous sinuses. No intracranial atherosclerosis.

Skull: The visualized skull base, calvarium and extracranial soft
tissues are normal.

Sinuses/Orbits: No fluid levels or advanced mucosal thickening of
the visualized paranasal sinuses. No mastoid or middle ear effusion.
The orbits are normal.

ASPECTS (Alberta Stroke Program Early CT Score)

- Ganglionic level infarction (caudate, lentiform nuclei, internal
capsule, insula, M1-M3 cortex): 7

- Supraganglionic infarction (M4-M6 cortex): 3

Total score (0-10 with 10 being normal): 10
IMPRESSION: 1. No acute intracranial abnormality.
2. ASPECTS is 10.

These results were called by telephone at the time of interpretation
on 09/25/2021 at [DATE] to provider Pitoirizarry Janetzy , who
verbally acknowledged these results.

## 2022-11-19 ENCOUNTER — Other Ambulatory Visit (HOSPITAL_COMMUNITY): Payer: Self-pay | Admitting: Cardiology

## 2022-11-27 ENCOUNTER — Telehealth: Payer: Self-pay

## 2022-11-27 NOTE — Telephone Encounter (Signed)
Looks to be that this sleep study for the pt was ordered back 12/2021 by Dr. Shirlee Latch. I will forward notes to [facility] clinic.

## 2022-11-27 NOTE — Telephone Encounter (Signed)
**Note De-Identified Ricardo Jones Obfuscation** 5/14-READY-NO PA REQ for [employer]

## 2022-11-28 ENCOUNTER — Other Ambulatory Visit (HOSPITAL_COMMUNITY): Payer: Self-pay | Admitting: Cardiology

## 2022-12-05 ENCOUNTER — Ambulatory Visit: Payer: BC Managed Care – PPO

## 2022-12-05 DIAGNOSIS — I428 Other cardiomyopathies: Secondary | ICD-10-CM

## 2022-12-05 DIAGNOSIS — I5022 Chronic systolic (congestive) heart failure: Secondary | ICD-10-CM

## 2022-12-07 LAB — CUP PACEART REMOTE DEVICE CHECK
Battery Remaining Longevity: 24 mo
Battery Voltage: 2.94 V
Brady Statistic AP VP Percent: 97.02 %
Brady Statistic AP VS Percent: 0 %
Brady Statistic AS VP Percent: 2.95 %
Brady Statistic AS VS Percent: 0.02 %
Brady Statistic RA Percent Paced: 96.68 %
Brady Statistic RV Percent Paced: 96.01 %
Date Time Interrogation Session: 20240523213423
HighPow Impedance: 68 Ohm
Implantable Lead Connection Status: 753985
Implantable Lead Connection Status: 753985
Implantable Lead Connection Status: 753985
Implantable Lead Implant Date: 20200224
Implantable Lead Implant Date: 20200224
Implantable Lead Implant Date: 20200224
Implantable Lead Location: 753858
Implantable Lead Location: 753859
Implantable Lead Location: 753860
Implantable Lead Model: 5076
Implantable Lead Model: 6935
Implantable Pulse Generator Implant Date: 20200224
Lead Channel Impedance Value: 235.98 Ohm
Lead Channel Impedance Value: 235.98 Ohm
Lead Channel Impedance Value: 239.922
Lead Channel Impedance Value: 256.5 Ohm
Lead Channel Impedance Value: 261.164
Lead Channel Impedance Value: 304 Ohm
Lead Channel Impedance Value: 380 Ohm
Lead Channel Impedance Value: 437 Ohm
Lead Channel Impedance Value: 437 Ohm
Lead Channel Impedance Value: 513 Ohm
Lead Channel Impedance Value: 513 Ohm
Lead Channel Impedance Value: 532 Ohm
Lead Channel Impedance Value: 817 Ohm
Lead Channel Impedance Value: 855 Ohm
Lead Channel Impedance Value: 855 Ohm
Lead Channel Impedance Value: 912 Ohm
Lead Channel Impedance Value: 912 Ohm
Lead Channel Impedance Value: 950 Ohm
Lead Channel Pacing Threshold Amplitude: 0.875 V
Lead Channel Pacing Threshold Amplitude: 0.875 V
Lead Channel Pacing Threshold Pulse Width: 0.4 ms
Lead Channel Pacing Threshold Pulse Width: 0.4 ms
Lead Channel Sensing Intrinsic Amplitude: 0.25 mV
Lead Channel Sensing Intrinsic Amplitude: 0.25 mV
Lead Channel Sensing Intrinsic Amplitude: 11.625 mV
Lead Channel Sensing Intrinsic Amplitude: 11.625 mV
Lead Channel Setting Pacing Amplitude: 1.5 V
Lead Channel Setting Pacing Amplitude: 2 V
Lead Channel Setting Pacing Amplitude: 2.5 V
Lead Channel Setting Pacing Pulse Width: 0.4 ms
Lead Channel Setting Pacing Pulse Width: 0.6 ms
Lead Channel Setting Sensing Sensitivity: 0.3 mV
Zone Setting Status: 755011
Zone Setting Status: 755011

## 2022-12-11 ENCOUNTER — Ambulatory Visit: Payer: BC Managed Care – PPO | Admitting: Neurology

## 2022-12-21 DIAGNOSIS — I493 Ventricular premature depolarization: Secondary | ICD-10-CM | POA: Insufficient documentation

## 2022-12-25 ENCOUNTER — Encounter: Payer: Self-pay | Admitting: Internal Medicine

## 2022-12-25 ENCOUNTER — Ambulatory Visit: Payer: BC Managed Care – PPO | Attending: Internal Medicine | Admitting: Internal Medicine

## 2022-12-25 VITALS — BP 104/68 | HR 79 | Ht 79.0 in | Wt 222.2 lb

## 2022-12-25 DIAGNOSIS — I4892 Unspecified atrial flutter: Secondary | ICD-10-CM | POA: Diagnosis not present

## 2022-12-25 DIAGNOSIS — I442 Atrioventricular block, complete: Secondary | ICD-10-CM | POA: Diagnosis not present

## 2022-12-25 DIAGNOSIS — I493 Ventricular premature depolarization: Secondary | ICD-10-CM

## 2022-12-25 DIAGNOSIS — I425 Other restrictive cardiomyopathy: Secondary | ICD-10-CM

## 2022-12-25 DIAGNOSIS — Z95810 Presence of automatic (implantable) cardiac defibrillator: Secondary | ICD-10-CM

## 2022-12-25 LAB — CUP PACEART INCLINIC DEVICE CHECK
Battery Remaining Longevity: 23 mo
Battery Voltage: 2.94 V
Brady Statistic AP VP Percent: 95.37 %
Brady Statistic AP VS Percent: 0 %
Brady Statistic AS VP Percent: 4.6 %
Brady Statistic AS VS Percent: 0.03 %
Brady Statistic RA Percent Paced: 94.35 %
Brady Statistic RV Percent Paced: 96.86 %
Date Time Interrogation Session: 20240611163305
HighPow Impedance: 79 Ohm
Implantable Lead Connection Status: 753985
Implantable Lead Connection Status: 753985
Implantable Lead Connection Status: 753985
Implantable Lead Implant Date: 20200224
Implantable Lead Implant Date: 20200224
Implantable Lead Implant Date: 20200224
Implantable Lead Location: 753858
Implantable Lead Location: 753859
Implantable Lead Location: 753860
Implantable Lead Model: 5076
Implantable Lead Model: 6935
Implantable Pulse Generator Implant Date: 20200224
Lead Channel Impedance Value: 261.164
Lead Channel Impedance Value: 261.164
Lead Channel Impedance Value: 266 Ohm
Lead Channel Impedance Value: 270 Ohm
Lead Channel Impedance Value: 275.172
Lead Channel Impedance Value: 304 Ohm
Lead Channel Impedance Value: 399 Ohm
Lead Channel Impedance Value: 437 Ohm
Lead Channel Impedance Value: 513 Ohm
Lead Channel Impedance Value: 532 Ohm
Lead Channel Impedance Value: 532 Ohm
Lead Channel Impedance Value: 570 Ohm
Lead Channel Impedance Value: 893 Ohm
Lead Channel Impedance Value: 893 Ohm
Lead Channel Impedance Value: 893 Ohm
Lead Channel Impedance Value: 969 Ohm
Lead Channel Impedance Value: 969 Ohm
Lead Channel Impedance Value: 988 Ohm
Lead Channel Pacing Threshold Amplitude: 0.875 V
Lead Channel Pacing Threshold Amplitude: 0.875 V
Lead Channel Pacing Threshold Amplitude: 1 V
Lead Channel Pacing Threshold Pulse Width: 0.4 ms
Lead Channel Pacing Threshold Pulse Width: 0.4 ms
Lead Channel Pacing Threshold Pulse Width: 0.6 ms
Lead Channel Sensing Intrinsic Amplitude: 0 mV
Lead Channel Sensing Intrinsic Amplitude: 0 mV
Lead Channel Sensing Intrinsic Amplitude: 0.625 mV
Lead Channel Sensing Intrinsic Amplitude: 0.625 mV
Lead Channel Sensing Intrinsic Amplitude: 11.625 mV
Lead Channel Sensing Intrinsic Amplitude: 11.625 mV
Lead Channel Setting Pacing Amplitude: 1.5 V
Lead Channel Setting Pacing Amplitude: 2 V
Lead Channel Setting Pacing Amplitude: 2.5 V
Lead Channel Setting Pacing Pulse Width: 0.4 ms
Lead Channel Setting Pacing Pulse Width: 0.6 ms
Lead Channel Setting Sensing Sensitivity: 0.3 mV
Zone Setting Status: 755011
Zone Setting Status: 755011

## 2022-12-25 NOTE — Progress Notes (Signed)
Patient Care Team: Darrow Bussing, MD as PCP - General (Family Medicine) Nahser, Deloris Ping, MD as PCP - Cardiology (Cardiology) Duke Salvia, MD as PCP - Electrophysiology (Cardiology) Hillis Range, MD (Inactive) as Attending Physician (Cardiology)   HPI  Ricardo Jones is a 52 y.o. 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  High resolution CT scan unrevealing of sarcoid.  Progressive symptomatic bradycardia with his complete heart block received CRT -D 2/20.  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      3/23 presented with left-sided weakness and aphasia, right MCA stroke treated with TNKase and thrombectomy.  He was discharged on apixaban and atorvastatin with a plan to initiate GDMT as an outpatient which has been done with close follow-up with the heart failure clinic.  He is currently on Entresto bisoprolol and spironolactone.  Some of fuzziness of head may be associated with lower blood pressure.  Overall sense of weakness from the stroke and he is left-handed and some loss of left hand dexterity   GT-EP lab 12/23 diffuse atrial scarring not mappable flutters.  Recently in [place].  Developed swelling at the end of May.  Wondered whether it was related to cortisone that he is taken for multiple mosquito bites no palpitations.  No real change in exercise tolerance  DATE  TEST EF    7/15 Echo   50-55 % Severe RAE/LAE  5/18 Echo  40-45 % Mild RAE/LAE  10/19 Echo  40-45%    11/19 cMRI   Severe BAE DGE+>Sarcoid  12/19 cPET   + Consistent Sarcoid  8/20 Echo  50-55%   1/23 Echo  45-50%   3/23 Echo  30-35% Severe BAE  3/24 Echo  40-45%       Date Cr K Hgb  6/20 1.01 4.7 14.3 (2/20)  1/24 0.89 4.9 14.9     Thromboembolic risk factors (CHF-1) for a CHADSVASc Score of sort of 1      Records and Results Reviewed*   Past Medical History:  Diagnosis Date   Acute right MCA stroke (HCC) 09/25/2021   s/p thrombectomy   Bradycardia    asymptomatic   Chronic systolic CHF (congestive heart failure), NYHA class 2 (HCC)    Complete heart block (HCC)    s/p CRT-D   Hyperlipidemia LDL goal <100    S/P dilatation of esophageal stricture    Typical atrial flutter (HCC)     Past Surgical History:  Procedure Laterality Date   A-FLUTTER ABLATION N/A 06/27/2022   Procedure: A-FLUTTER ABLATION;  Surgeon: Marinus Maw, MD;  Location: [facility] INVASIVE CV LAB;  Service: Cardiovascular;  Laterality: N/A;   BIV ICD INSERTION CRT-D N/A 09/08/2018   Procedure: BIV ICD INSERTION CRT-D;  Surgeon: Marinus Maw, MD;  [facility];  Service: Cardiovascular;  Laterality: N/A;   IR CT HEAD LTD  09/25/2021  IR PERCUTANEOUS ART THROMBECTOMY/INFUSION INTRACRANIAL INC DIAG ANGIO  09/25/2021   RADIOLOGY WITH ANESTHESIA N/A 09/25/2021   Procedure: IR WITH ANESTHESIA;  Surgeon: Julieanne Cotton, MD;  Location: [facility] OR;  Service: Radiology;  Laterality: N/A;   THROAT SURGERY  2012    Current Meds  Medication Sig   atorvastatin (LIPITOR) 40 MG tablet TAKE 1 TABLET BY MOUTH EVERY DAY   bisoprolol (ZEBETA) 5 MG tablet Take 1 tablet (5 mg total) by mouth daily.   ELIQUIS 5 MG TABS tablet TAKE 1 TABLET BY MOUTH TWICE A DAY   FARXIGA 10 MG TABS tablet TAKE 1 TABLET BY MOUTH DAILY BEFORE BREAKFAST.   fluconazole (DIFLUCAN) 200 MG tablet Take 200 mg by mouth once a week.    spironolactone (ALDACTONE) 25 MG tablet TAKE 1/2 TABLET BY MOUTH EVERY DAY (Patient taking differently: Take 25 mg by mouth daily.)   valsartan (DIOVAN) 40 MG tablet Take 1 tablet (40 mg total) by mouth daily.    No Known Allergies    Review of Systems negative except from HPI and PMH  Physical Exam BP 104/68   Pulse 79   Ht 6\' 7"  (2.007 m)   Wt 222 lb 3.2 oz (100.8 kg)   SpO2 98%   BMI 25.03 kg/m  Well developed and well nourished in no acute distress HENT normal Neck supple with JVP-flat Clear Device pocket well healed; without hematoma or erythema.  There is no tethering  Regular rate and rhythm, no  gallop No / murmur Abd-soft with active BS No Clubbing cyanosis  edema Skin-warm and dry A & Oriented  Grossly normal sensory and motor function  ECG AV pacing at 79 Upright QRS in lead V1 negative QRS lead I  Device function is normal. Programming changes because of atrial flutter with rapid tracking, we decreased the upper tracking rate from 140--100 See Paceart for details    Assessment and  Plan  Cardiomyopathy-LMN   Complete heart block  Atrial flutter progressively slowing more recently occurring at a rapid rate  CRT-D   PVCs   Stroke  Infrequent recurrences of atrial arrhythmia but have been more problematic and potentially associated with his fluid accumulation while in [place].  We discussed it extensively, and reprogram the upper tracking rate as noted above.  Otherwise, we will keep the medications as they are.  Struggling with sleep taking his Aldactone and Farxiga at night and awakening not be able to go back to sleep suggest he take his Marcelline Deist in the morning

## 2022-12-25 NOTE — Patient Instructions (Addendum)
Medication Instructions:  Your physician has recommended you make the following change in your medication:   ** Please take your Farxiga10mg  - 1 tablet every morning  *If you need a refill on your cardiac medications before your next appointment, please call your pharmacy*   Lab Work: None ordered.  If you have labs (blood work) drawn today and your tests are completely normal, you will receive your results only by: MyChart Message (if you have MyChart) OR A paper copy in the mail If you have any lab test that is abnormal or we need to change your treatment, we will call you to review the results.   Testing/Procedures: None ordered.    Follow-Up: At [facility], you and your health needs are our priority.  As part of our continuing mission to provide you with exceptional heart care, we have created designated Provider Care Teams.  These Care Teams include your primary Cardiologist (physician) and Advanced Practice Providers (APPs -  Physician Assistants and Nurse Practitioners) who all work together to provide you with the care you need, when you need it.  We recommend signing up for the patient portal called "MyChart".  Sign up information is provided on this After Visit Summary.  MyChart is used to connect with patients for Virtual Visits (Telemedicine).  Patients are able to view lab/test results, encounter notes, upcoming appointments, etc.  Non-urgent messages can be sent to your provider as well.   To learn more about what you can do with MyChart, go to [email or web address].    Your next appointment:   You are past due to see Dr Shirlee Latch  6 months with Dr Graciela Husbands

## 2022-12-26 NOTE — Progress Notes (Signed)
Remote ICD transmission.   

## 2023-01-19 ENCOUNTER — Other Ambulatory Visit (HOSPITAL_COMMUNITY): Payer: Self-pay | Admitting: Cardiology

## 2023-02-09 ENCOUNTER — Other Ambulatory Visit (HOSPITAL_COMMUNITY): Payer: Self-pay | Admitting: Cardiology

## 2023-03-01 ENCOUNTER — Other Ambulatory Visit (HOSPITAL_COMMUNITY): Payer: Self-pay | Admitting: Cardiology

## 2023-03-01 MED ORDER — SPIRONOLACTONE 25 MG PO TABS: 25.0000 mg | ORAL_TABLET | Freq: Every day | ORAL | 3 refills | Status: DC

## 2023-03-06 ENCOUNTER — Ambulatory Visit (INDEPENDENT_AMBULATORY_CARE_PROVIDER_SITE_OTHER): Payer: BC Managed Care – PPO

## 2023-03-06 DIAGNOSIS — I442 Atrioventricular block, complete: Secondary | ICD-10-CM | POA: Diagnosis not present

## 2023-03-06 LAB — CUP PACEART REMOTE DEVICE CHECK
Battery Remaining Longevity: 21 mo
Battery Voltage: 2.93 V
Brady Statistic AP VP Percent: 43.5 %
Brady Statistic AP VS Percent: 0 %
Brady Statistic AS VP Percent: 55.07 %
Brady Statistic AS VS Percent: 1.43 %
Brady Statistic RA Percent Paced: 36.7 %
Brady Statistic RV Percent Paced: 97.39 %
Date Time Interrogation Session: 20240821033524
HighPow Impedance: 73 Ohm
Implantable Lead Connection Status: 753985
Implantable Lead Connection Status: 753985
Implantable Lead Connection Status: 753985
Implantable Lead Implant Date: 20200224
Implantable Lead Implant Date: 20200224
Implantable Lead Implant Date: 20200224
Implantable Lead Location: 753858
Implantable Lead Location: 753859
Implantable Lead Location: 753860
Implantable Lead Model: 5076
Implantable Lead Model: 6935
Implantable Pulse Generator Implant Date: 20200224
Lead Channel Impedance Value: 1026 Ohm
Lead Channel Impedance Value: 256.148
Lead Channel Impedance Value: 256.148
Lead Channel Impedance Value: 264.643
Lead Channel Impedance Value: 266 Ohm
Lead Channel Impedance Value: 275.172
Lead Channel Impedance Value: 304 Ohm
Lead Channel Impedance Value: 380 Ohm
Lead Channel Impedance Value: 437 Ohm
Lead Channel Impedance Value: 494 Ohm
Lead Channel Impedance Value: 532 Ohm
Lead Channel Impedance Value: 532 Ohm
Lead Channel Impedance Value: 570 Ohm
Lead Channel Impedance Value: 893 Ohm
Lead Channel Impedance Value: 912 Ohm
Lead Channel Impedance Value: 950 Ohm
Lead Channel Impedance Value: 950 Ohm
Lead Channel Impedance Value: 969 Ohm
Lead Channel Pacing Threshold Amplitude: 0.75 V
Lead Channel Pacing Threshold Amplitude: 0.875 V
Lead Channel Pacing Threshold Pulse Width: 0.4 ms
Lead Channel Pacing Threshold Pulse Width: 0.4 ms
Lead Channel Sensing Intrinsic Amplitude: 0.5 mV
Lead Channel Sensing Intrinsic Amplitude: 0.5 mV
Lead Channel Sensing Intrinsic Amplitude: 14 mV
Lead Channel Sensing Intrinsic Amplitude: 14 mV
Lead Channel Setting Pacing Amplitude: 1.5 V
Lead Channel Setting Pacing Amplitude: 2 V
Lead Channel Setting Pacing Amplitude: 2.5 V
Lead Channel Setting Pacing Pulse Width: 0.4 ms
Lead Channel Setting Pacing Pulse Width: 0.6 ms
Lead Channel Setting Sensing Sensitivity: 0.3 mV
Zone Setting Status: 755011
Zone Setting Status: 755011

## 2023-03-19 NOTE — Progress Notes (Signed)
Remote ICD transmission.   

## 2023-03-21 ENCOUNTER — Encounter (HOSPITAL_COMMUNITY): Payer: Self-pay | Admitting: Cardiology

## 2023-03-21 ENCOUNTER — Ambulatory Visit (HOSPITAL_COMMUNITY)
Admission: RE | Admit: 2023-03-21 | Discharge: 2023-03-21 | Disposition: A | Discharge status: Home / Self Care | Payer: BC Managed Care – PPO | Source: Ambulatory Visit | Attending: Cardiology | Admitting: Cardiology

## 2023-03-21 ENCOUNTER — Other Ambulatory Visit (HOSPITAL_COMMUNITY): Payer: BC Managed Care – PPO

## 2023-03-21 VITALS — BP 90/50 | HR 74 | Wt 221.6 lb

## 2023-03-21 DIAGNOSIS — I480 Paroxysmal atrial fibrillation: Secondary | ICD-10-CM | POA: Diagnosis not present

## 2023-03-21 DIAGNOSIS — Z8673 Personal history of transient ischemic attack (TIA), and cerebral infarction without residual deficits: Secondary | ICD-10-CM | POA: Diagnosis not present

## 2023-03-21 DIAGNOSIS — I5022 Chronic systolic (congestive) heart failure: Secondary | ICD-10-CM | POA: Diagnosis not present

## 2023-03-21 DIAGNOSIS — R9431 Abnormal electrocardiogram [ECG] [EKG]: Secondary | ICD-10-CM | POA: Diagnosis not present

## 2023-03-21 DIAGNOSIS — I428 Other cardiomyopathies: Secondary | ICD-10-CM | POA: Insufficient documentation

## 2023-03-21 DIAGNOSIS — Z95810 Presence of automatic (implantable) cardiac defibrillator: Secondary | ICD-10-CM | POA: Insufficient documentation

## 2023-03-21 DIAGNOSIS — Z7901 Long term (current) use of anticoagulants: Secondary | ICD-10-CM | POA: Diagnosis not present

## 2023-03-21 LAB — BASIC METABOLIC PANEL
Anion gap: 8 (ref 5–15)
BUN: 19 mg/dL (ref 6–20)
CO2: 25 mmol/L (ref 22–32)
Calcium: 9.4 mg/dL (ref 8.9–10.3)
Chloride: 103 mmol/L (ref 98–111)
Creatinine, Ser: 0.99 mg/dL (ref 0.61–1.24)
GFR, Estimated: >60 mL/min (ref >60)
Glucose, Bld: 88 mg/dL (ref 70–99)
Potassium: 4.4 mmol/L (ref 3.5–5.1)
Sodium: 136 mmol/L (ref 135–145)

## 2023-03-21 LAB — LIPID PANEL
Cholesterol: 118 mg/dL (ref 0–200)
HDL: 51 mg/dL (ref >40)
LDL Cholesterol: 53 mg/dL (ref 0–99)
Total CHOL/HDL Ratio: 2.3 ratio
Triglycerides: 72 mg/dL (ref <150)
VLDL: 14 mg/dL (ref 0–40)

## 2023-03-21 LAB — BRAIN NATRIURETIC PEPTIDE: B Natriuretic Peptide: 87 pg/mL (ref 0.0–100.0)

## 2023-03-21 MED ORDER — BISOPROLOL FUMARATE 5 MG PO TABS: 7.5000 mg | ORAL_TABLET | Freq: Every day | ORAL | 5 refills | Status: DC

## 2023-03-21 NOTE — Patient Instructions (Signed)
Medication Changes:  INCREASE BISOPROLOL TO 7.5MG  ONCE DAILY   Lab Work:  Labs done today, your results will be available in MyChart, we will contact you for abnormal readings  Follow-Up in: 3 months with Dr. Shirlee Latch as scheduled   At [facility], you and your health needs are our priority. We have a designated team specialized in the treatment of Heart Failure. This Care Team includes your primary Heart Failure Specialized Cardiologist (physician), Advanced Practice Providers (APPs- Physician Assistants and Nurse Practitioners), and Pharmacist who all work together to provide you with the care you need, when you need it.   You may see any of the following providers on your designated Care Team at your next follow up:  Dr. Arvilla Meres Dr. Marca Ancona Dr. Marcos Eke, NP Robbie Lis, [place] [facility] [place], [place] Brynda Peon, NP Karle Plumber, PharmD   Please be sure to bring in all your medications bottles to every appointment.   Need to Contact us:  If you have any questions or concerns before your next appointment please send [place] a message through [place] or call our office at [phone].    TO LEAVE A MESSAGE FOR THE NURSE SELECT OPTION 2, PLEASE LEAVE A MESSAGE INCLUDING: YOUR NAME DATE OF BIRTH CALL BACK NUMBER REASON FOR CALL**this is important as we prioritize the call backs  YOU WILL RECEIVE A CALL BACK THE SAME DAY AS LONG AS YOU CALL BEFORE 4:00 PM

## 2023-03-24 NOTE — Progress Notes (Signed)
Primary Care: Darrow Bussing, MD HF Cardiologist: Dr. Shirlee Latch EP: Dr. Graciela Husbands  HPI: 52 y.o. male w/ chronic systolic heart failure, CHB and VT s/p Medtronic CRT-D, atrial flutter and CVA.   He has a strong family history of cardiomyopathy.  Uncle with heart transplant, 2 cousins with pacemakers. His mother has had genetic testing and was LMNA gene mutation positive.  He was initially followed by Dr. Johney Frame and Dr. Elease Hashimoto for atrial flutter and bradycardia. Declined atrial flutter ablation in the past. 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 Subsequently was referred for cMRI and PET scan in 2019. cMRI showed non-coronary pattern LGE suspicious for infiltrative disease. PET scan at [facility] showed abnormal FDG uptake consistent with inflamatory process of the myocardium, most especially involving the basal inferior, basal inferoseptal and mid inferoseptal walls. This was also concerning for possible sarcoid; however he had chest CT that showed no pulmonary evidence for sarcoid. Given no definitive diagnosis, pt declined empiric immunosuppressive treatment, but did undergo Medtronic CRT-D placement in 2/20 by Dr. Ladona Ridgel. He has since been followed primarily by Dr. Graciela Husbands.   Echo 01/2014 EF 50-55%, RV normal  Echo 11/2016 EF 40-45%, RV normal  Echo 04/2018 EF 40-45%, RV normal  Echo 02/2019 EF 50-55%, RV normal  Echo 07/2021 showed mildly reduced LVEF, 45-50%, normal RV. Moderate BAE.   Admitted to [facility] 09/25/21 w/ right MCA infarct due to right M1 occlusion, likely secondary to atrial flutter and cardiomyopathy. Unfortunately, had not been on anticoagulation prior to this. He underwent successful mechanical thrombectomy by IR. Patient 's symptoms improved rapidly.  Cardiology was consulted as TTE showed EF of 30-35%, mild RV dilation, severe biatrial enlargement.  He was placed on limited medical therapy to allow for permissive hypertension following stroke. Started  on Coreg 3.15 mg bid. Eliquis was started for atrial flutter. Atorvastatin added (LDL 100 mg/dL). Hgb A1c 6.0. Baseline SCr < 1.0. Discharged home on 09/28/21. Referred to HF clinic.   When I saw him initially, I obtained genetic testing.  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   CPX in 9/23 showed mildly decreased functional capacity.   Patient had EP study for possible atrial flutter ablation in 12/23.  This showed simultaneous left and right atrial flutters.  Due to extensive scar tissue in atria, flutter ablation was not done and patient was cardioverted to NSR.   Echo in 3/24 showed EF 40-45%, mild LV dilation, mild RV enlargement with normal systolic function, IVC normal. Medtronic device interrogation showed about 1 month of atrial flutter vs fibrillation in 7/24.  Patient thinks he had his home device monitor unplugged and he never had a notification that he was out of rhythm.  He did not feel much different in July but says that his ankles were swollen.   He returns today for followup of CHF.  He is in NSR today, device interrogation shows mostly NSR since the beginning of August.  Currently, no peripheral edema. No significant exertional dyspnea. No chest pain.  He swims and walks for exercise.  No problems walking up hills.  BP 90/50 but denies lightheadedness.  Weight up 5 lbs.   MDT device interrogation: Stable thoracic impedance, >99% BiV pacing.  He was in atrial fibrillation/flutter for all of July, but since August he has been mostly in NSR.   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  Medtronic CRT-D device.  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  - Echo 01/2014 EF 50-55%, RV normal  - Echo 11/2016 EF 40-45%, RV normal  - Echo 04/2018 EF 40-45%, RV normal  - Cardiac MRI (11/19): LV EF 44%, RV EF 62%, patchy mid-wall LGE in the basal septal and lateral walls.  - CPET (12/19, [facility]): abnormal FDG uptake consistent with inflammatory process of the myocardium. More especially involving the basal-inferior, Basal inferoseptal and mid inferoseptal walls. In the proper clinical settings this may represent active sarcoidosis in the myocardium. - Echo 02/2019 EF 50-55%, RV normal  - Echo 07/2021 showed mildly reduced LVEF, 45-50%, normal RV. Moderate BAE.  - Echo 3/23 EF 30-35%, mild RV dilation, severe biatrial enlargement. - CPX (9/23): Peak VO2 23, VE/VCO2 slope 30, RER 1.1.  Mildly decreased functional capacity.  - Echo (3/24): EF 40-45%, mild LV dilation, mild RV enlargement with normal systolic function, IVC normal 2. 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  3. Atrial flutter: Paroxysmal.  -  EP study for possible atrial flutter ablation in 12/23.  This showed simultaneous left and right atrial flutters.  Due to extensive scar tissue in atria, flutter ablation was not done and patient was cardioverted to NSR.  4. CVA: Admitted to [facility] 09/25/21 w/ right MCA infarct due to right M1 occlusion, likely secondary to atrial flutter.  5. H/o VT  Review of Systems: All systems reviewed and negative except as per HPI.   Current Outpatient Medications  Medication Sig Dispense Refill   atorvastatin (LIPITOR) 40 MG tablet TAKE 1 TABLET BY MOUTH EVERY DAY 90 tablet 3   ELIQUIS 5 MG TABS tablet TAKE 1 TABLET BY MOUTH TWICE A DAY 180 tablet 3   FARXIGA 10 MG TABS tablet TAKE 1 TABLET BY MOUTH DAILY BEFORE BREAKFAST. 30 tablet 11   fluconazole (DIFLUCAN) 200 MG tablet Take 200 mg by mouth once a week.     spironolactone (ALDACTONE) 25 MG tablet Take 1 tablet (25 mg total) by mouth daily. 90 tablet 3   valsartan (DIOVAN) 40 MG  tablet TAKE 1 TABLET BY MOUTH EVERY DAY 90 tablet 0   bisoprolol (ZEBETA) 5 MG tablet Take 1.5 tablets (7.5 mg total) by mouth daily. 45 tablet 5   No current facility-administered medications for this encounter.    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[employer], previously an Technical sales engineer   Social Determinants of Corporate investment banker Strain: Not on [employer] Insecurity: Not on file  Transportation Needs: Not on file  Physical Activity: Not on file  Stress: Not on file  Social Connections: Not on file  Intimate Partner Violence: Not on file      Family History  Problem Relation Age of Onset   Asthma Mother    Cancer Maternal Grandmother    Cancer Maternal Grandfather    Cancer Paternal Grandmother    Heart failure Maternal Uncle    Bradycardia Other        multiple family members with pacemakers   Heart failure Maternal Uncle s/p Cardiac Transplant   Maternal Cousin s/p Cardiac Transplant       Vitals:   03/21/23 0912  BP: (!) 90/50  Pulse: 74  SpO2: 98%  Weight: 100.5 kg (221 lb 9.6 oz)    PHYSICAL EXAM: General: NAD Neck: No JVD, no thyromegaly or thyroid nodule.  Lungs: Clear to auscultation bilaterally with normal respiratory effort. CV: Nondisplaced PMI.  Heart regular S1/S2, no S3/S4, no murmur.  No peripheral edema.  No carotid bruit.  Normal pedal pulses.  Abdomen: Soft, nontender, no  hepatosplenomegaly, no distention.  Skin: Intact without lesions or rashes.  Neurologic: Alert and oriented x 3.  Psych: Normal affect. Extremities: No clubbing or cyanosis.  HEENT: Normal.   ASSESSMENT & PLAN:  1. 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  Patient has had DCM associated with CHB and with atrial flutter.  Strong family history of cardiomyopathy and conduction disease/pacemaker placement.  Patient's mother also has LMNA gene mutation.  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 Patient has MDT CRT-D.  cMRI in 11/19 showed LV EF 44%, RV EF 62%, patchy mid-wall LGE in the basal septal and lateral walls.  cPET in 12/19 showed abnormal uptake in myocardium concerning for inflammatory process.  There was initial concern for cardiac sarcoidosis as a unifying diagnosis, but no sign of pulmonary sarcoidosis on CT chest and patient is positive for LMNA gene mutation.  The cMRI and cPET patterns that the patient had can be seen with genetic cardiomyopathies, often appearing to masquerade as cardiac sarcoidosis.  I think cardiac sarcoidosis is unlikely and would not immunosuppress.  Most recent echo in 3/24 showed EF 40-45%, mild RV dilation. CPX in 9/23 showed mildly decreased functional capacity.  NYHA class I, feels good overall.  Not volume overloaded by exam or Optivol. He has struggled to tolerate GDMT, SBP generally runs in 90s-100s, but seems to be tolerating current regimen well.  - I will try increasing his bisoprolol to 7.5 mg qhs.   - Off Entresto due to orthostasis.  Continue valsartan 40 mg qhs.  - Continue spironolactone 25 mg daily. BMET/BNP today.  - Continue Farxiga 10 mg daily.   2. Paroxysmal Atrial Flutter: Dates back to at least 2012.  Flutter ablation attempt in 12/23 with Dr. Ladona Ridgel showed simultaneous  left and right atrial flutters.  Due to extensive scar tissue in atria, flutter ablation was not done and patient was cardioverted to NSR. Device interrogation today showed about a month of atrial flutter in 7/24.  Recently in NSR with short flutter runs.   - Long-term Eliquis with CVA history.   - If he has another long episode of atrial flutter, will need to start an anti-arrhythmic.  Given his age, dofetilide or sotalol would be best options most likely. Would also need to consider re-attempting flutter ablation.  Will need  to discuss this with Dr. Graciela Husbands.  3. Rt MCA CVA: Recent MCA infarct due to right M1 occlusion 3/23. Likely secondary to atrial flutter and cardiomyopathy (LVEF 30-35%). Had not been anticoagulated. s/p mechanical thrombectomy by IR - Continue Eliquis 5 mg bid.  - Continue statin. Check lipids today.  4. CHB: MDT CRT-D.  >99% BiV paced on device interrogation. 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 H/o VT: Has ICD, followed by Dr. Graciela Husbands  - no recent VT on device interrogation   Followup 4 months   Marca Ancona 03/24/2023

## 2023-03-29 ENCOUNTER — Encounter: Payer: Self-pay | Admitting: Cardiology

## 2023-04-10 ENCOUNTER — Encounter (HOSPITAL_COMMUNITY): Payer: Self-pay | Admitting: Cardiology

## 2023-04-10 ENCOUNTER — Encounter: Payer: Self-pay | Admitting: Internal Medicine

## 2023-04-10 NOTE — Telephone Encounter (Signed)
See other My Chart encounter.. Dr Shirlee Latch is managing the pt and proceeding with antiarrythmic therapy. Will close this encounter.

## 2023-04-18 ENCOUNTER — Ambulatory Visit: Payer: BC Managed Care – PPO | Admitting: Internal Medicine

## 2023-04-19 ENCOUNTER — Other Ambulatory Visit (HOSPITAL_COMMUNITY): Payer: Self-pay | Admitting: Cardiology

## 2023-06-05 ENCOUNTER — Ambulatory Visit (INDEPENDENT_AMBULATORY_CARE_PROVIDER_SITE_OTHER): Payer: BC Managed Care – PPO

## 2023-06-05 DIAGNOSIS — I442 Atrioventricular block, complete: Secondary | ICD-10-CM

## 2023-06-05 DIAGNOSIS — I484 Atypical atrial flutter: Secondary | ICD-10-CM

## 2023-06-05 LAB — CUP PACEART REMOTE DEVICE CHECK
Battery Remaining Longevity: 21 mo
Battery Voltage: 2.92 V
Brady Statistic AP VP Percent: 65.86 %
Brady Statistic AP VS Percent: 0 %
Brady Statistic AS VP Percent: 33.5 %
Brady Statistic AS VS Percent: 0.64 %
Brady Statistic RA Percent Paced: 60.55 %
Brady Statistic RV Percent Paced: 98.01 %
Date Time Interrogation Session: 20241120001804
HighPow Impedance: 82 Ohm
Implantable Lead Connection Status: 753985
Implantable Lead Connection Status: 753985
Implantable Lead Connection Status: 753985
Implantable Lead Implant Date: 20200224
Implantable Lead Implant Date: 20200224
Implantable Lead Implant Date: 20200224
Implantable Lead Location: 753858
Implantable Lead Location: 753859
Implantable Lead Location: 753860
Implantable Lead Model: 5076
Implantable Lead Model: 6935
Implantable Pulse Generator Implant Date: 20200224
Lead Channel Impedance Value: 261.164
Lead Channel Impedance Value: 270 Ohm
Lead Channel Impedance Value: 270 Ohm
Lead Channel Impedance Value: 275.172
Lead Channel Impedance Value: 275.172
Lead Channel Impedance Value: 323 Ohm
Lead Channel Impedance Value: 399 Ohm
Lead Channel Impedance Value: 456 Ohm
Lead Channel Impedance Value: 513 Ohm
Lead Channel Impedance Value: 532 Ohm
Lead Channel Impedance Value: 570 Ohm
Lead Channel Impedance Value: 570 Ohm
Lead Channel Impedance Value: 912 Ohm
Lead Channel Impedance Value: 950 Ohm
Lead Channel Impedance Value: 950 Ohm
Lead Channel Impedance Value: 969 Ohm
Lead Channel Impedance Value: 969 Ohm
Lead Channel Impedance Value: 988 Ohm
Lead Channel Pacing Threshold Amplitude: 0.75 V
Lead Channel Pacing Threshold Amplitude: 0.875 V
Lead Channel Pacing Threshold Pulse Width: 0.4 ms
Lead Channel Pacing Threshold Pulse Width: 0.4 ms
Lead Channel Sensing Intrinsic Amplitude: 0.625 mV
Lead Channel Sensing Intrinsic Amplitude: 0.625 mV
Lead Channel Sensing Intrinsic Amplitude: 14 mV
Lead Channel Sensing Intrinsic Amplitude: 14 mV
Lead Channel Setting Pacing Amplitude: 1.5 V
Lead Channel Setting Pacing Amplitude: 2 V
Lead Channel Setting Pacing Amplitude: 2.5 V
Lead Channel Setting Pacing Pulse Width: 0.4 ms
Lead Channel Setting Pacing Pulse Width: 0.6 ms
Lead Channel Setting Sensing Sensitivity: 0.3 mV
Zone Setting Status: 755011
Zone Setting Status: 755011

## 2023-06-09 ENCOUNTER — Ambulatory Visit (HOSPITAL_COMMUNITY)
Admission: EM | Admit: 2023-06-09 | Discharge: 2023-06-10 | Disposition: A | Discharge status: Home / Self Care | Payer: BC Managed Care – PPO | Attending: Emergency Medicine | Admitting: Emergency Medicine

## 2023-06-09 ENCOUNTER — Encounter (HOSPITAL_COMMUNITY): Payer: Self-pay | Admitting: Certified Registered Nurse Anesthetist

## 2023-06-09 ENCOUNTER — Other Ambulatory Visit: Payer: Self-pay

## 2023-06-09 DIAGNOSIS — Z7984 Long term (current) use of oral hypoglycemic drugs: Secondary | ICD-10-CM | POA: Insufficient documentation

## 2023-06-09 DIAGNOSIS — I5022 Chronic systolic (congestive) heart failure: Secondary | ICD-10-CM | POA: Insufficient documentation

## 2023-06-09 DIAGNOSIS — K222 Esophageal obstruction: Secondary | ICD-10-CM | POA: Diagnosis not present

## 2023-06-09 DIAGNOSIS — K56699 Other intestinal obstruction unspecified as to partial versus complete obstruction: Secondary | ICD-10-CM

## 2023-06-09 DIAGNOSIS — T18128A Food in esophagus causing other injury, initial encounter: Secondary | ICD-10-CM | POA: Insufficient documentation

## 2023-06-09 DIAGNOSIS — I429 Cardiomyopathy, unspecified: Secondary | ICD-10-CM | POA: Diagnosis not present

## 2023-06-09 DIAGNOSIS — Z8673 Personal history of transient ischemic attack (TIA), and cerebral infarction without residual deficits: Secondary | ICD-10-CM | POA: Diagnosis not present

## 2023-06-09 DIAGNOSIS — E785 Hyperlipidemia, unspecified: Secondary | ICD-10-CM | POA: Insufficient documentation

## 2023-06-09 DIAGNOSIS — Z79899 Other long term (current) drug therapy: Secondary | ICD-10-CM | POA: Insufficient documentation

## 2023-06-09 DIAGNOSIS — Z7901 Long term (current) use of anticoagulants: Secondary | ICD-10-CM | POA: Diagnosis not present

## 2023-06-09 DIAGNOSIS — Z95810 Presence of automatic (implantable) cardiac defibrillator: Secondary | ICD-10-CM | POA: Insufficient documentation

## 2023-06-09 DIAGNOSIS — I071 Rheumatic tricuspid insufficiency: Secondary | ICD-10-CM | POA: Insufficient documentation

## 2023-06-09 DIAGNOSIS — K219 Gastro-esophageal reflux disease without esophagitis: Secondary | ICD-10-CM | POA: Diagnosis not present

## 2023-06-09 DIAGNOSIS — K2289 Other specified disease of esophagus: Secondary | ICD-10-CM

## 2023-06-09 DIAGNOSIS — I4892 Unspecified atrial flutter: Secondary | ICD-10-CM | POA: Insufficient documentation

## 2023-06-09 DIAGNOSIS — W44F3XA Food entering into or through a natural orifice, initial encounter: Secondary | ICD-10-CM | POA: Insufficient documentation

## 2023-06-09 DIAGNOSIS — T182XXA Foreign body in stomach, initial encounter: Secondary | ICD-10-CM

## 2023-06-09 DIAGNOSIS — I4891 Unspecified atrial fibrillation: Secondary | ICD-10-CM | POA: Diagnosis not present

## 2023-06-09 LAB — BASIC METABOLIC PANEL
Anion gap: 6 (ref 5–15)
BUN: 18 mg/dL (ref 6–20)
CO2: 25 mmol/L (ref 22–32)
Calcium: 9.3 mg/dL (ref 8.9–10.3)
Chloride: 107 mmol/L (ref 98–111)
Creatinine, Ser: 1.08 mg/dL (ref 0.61–1.24)
GFR, Estimated: >60 mL/min (ref >60)
Glucose, Bld: 107 mg/dL — ABNORMAL HIGH (ref 70–99)
Potassium: 4.5 mmol/L (ref 3.5–5.1)
Sodium: 138 mmol/L (ref 135–145)

## 2023-06-09 LAB — CBC
HCT: 42.6 % (ref 39.0–52.0)
Hemoglobin: 14.1 g/dL (ref 13.0–17.0)
MCH: 29.9 pg (ref 26.0–34.0)
MCHC: 33.1 g/dL (ref 30.0–36.0)
MCV: 90.3 fL (ref 80.0–100.0)
Platelets: 157 10*3/uL (ref 150–400)
RBC: 4.72 MIL/uL (ref 4.22–5.81)
RDW: 12.4 % (ref 11.5–15.5)
WBC: 8.3 10*3/uL (ref 4.0–10.5)
nRBC: 0 % (ref 0.0–0.2)

## 2023-06-09 NOTE — ED Provider Notes (Incomplete)
[place] EMERGENCY DEPARTMENT AT [facility] Provider Note   CSN: [id number] Arrival date & time: 06/09/23  2156     History {Add pertinent medical, surgical, social history, OB history to HPI:1} Chief Complaint  Patient presents with  . food bolus   HPI Ricardo Jones is a 52 y.o. male with history of esophageal stricture status post dilation in 2012 presenting for food bolus.  States he ate a chicken salad around 4 PM this afternoon and started to feel that something was stuck in his throat.  States has had this sensation many times since his procedure but it usually goes away but this has been persistent.  States he has been retching and spitting up lots of saliva.  Denies any trouble breathing.  Other pertinent history: chronic systolic heart failure, CHB and VT s/p Medtronic CRT-D, atrial flutter and CVA   HPI     Home Medications Prior to Admission medications   Medication Sig Start Date End Date Taking? Authorizing Provider  atorvastatin (LIPITOR) 40 MG tablet TAKE 1 TABLET BY MOUTH EVERY DAY 08/23/22   Laurey Morale, MD  bisoprolol (ZEBETA) 5 MG tablet Take 1.5 tablets (7.5 mg total) by mouth daily. 03/21/23   Laurey Morale, MD  ELIQUIS 5 MG TABS tablet TAKE 1 TABLET BY MOUTH TWICE A DAY 11/19/22   Laurey Morale, MD  FARXIGA 10 MG TABS tablet TAKE 1 TABLET BY MOUTH DAILY BEFORE BREAKFAST. 11/28/22   Laurey Morale, MD  fluconazole (DIFLUCAN) 200 MG tablet Take 200 mg by mouth once a week.    [provider]  spironolactone (ALDACTONE) 25 MG tablet Take 1 tablet (25 mg total) by mouth daily. 03/01/23   Laurey Morale, MD  valsartan (DIOVAN) 40 MG tablet TAKE 1 TABLET BY MOUTH EVERY DAY 04/19/23   Laurey Morale, MD      Allergies    Patient has no known allergies.    Review of Systems   See HPI  Physical Exam Updated Vital Signs BP 120/86 (BP Location: Right Arm)   Pulse 79   Temp 98.8 F (37.1 C)   Resp 18   Ht 6\' 7"  (2.007 m)   Wt  97.5 kg   SpO2 100%   BMI 24.22 kg/m  Physical Exam  ED Results / Procedures / Treatments   Labs (all labs ordered are listed, but only abnormal results are displayed) Labs Reviewed - No data to display  EKG None  Radiology No results found.  Procedures Procedures  {Document cardiac monitor, telemetry assessment procedure when appropriate:1}  Medications Ordered in ED Medications - No data to display  ED Course/ Medical Decision Making/ A&P   {   Click here for ABCD2, HEART and other calculatorsREFRESH Note before signing :1}                              Medical Decision Making  ***  {Document critical care time when appropriate:1} {Document review of labs and clinical decision tools ie heart score, Chads2Vasc2 etc:1}  {Document your independent review of radiology images, and any outside records:1} {Document your discussion with family members, caretakers, and with consultants:1} {Document social determinants of health affecting pt's care:1} {Document your decision making why or why not admission, treatments were needed:1} Final Clinical Impression(s) / ED Diagnoses Final diagnoses:  None    Rx / DC Orders ED Discharge Orders     None

## 2023-06-09 NOTE — ED Provider Notes (Signed)
[place] EMERGENCY DEPARTMENT AT [facility] Provider Note   CSN: [id number] Arrival date & time: 06/09/23  2156     History  Chief Complaint  Patient presents with   food bolus   HPI Ricardo Jones is a 52 y.o. male with history of esophageal stricture status post dilation in 2012 presenting for food bolus.  States he ate a chicken salad around 4 PM this afternoon and started to feel that something was stuck in his throat.  States has had this sensation many times since his procedure but it usually goes away but this has been persistent.  States he has been retching and spitting up lots of saliva.  Denies any trouble breathing.  Other pertinent history: chronic systolic heart failure, CHB and VT s/p [employer] CRT-D, atrial flutter and CVA   HPI     Home Medications Prior to Admission medications   Medication Sig Start Date End Date Taking? Authorizing Provider  atorvastatin (LIPITOR) 40 MG tablet TAKE 1 TABLET BY MOUTH EVERY DAY 08/23/22  Yes Laurey Morale, MD  bisoprolol (ZEBETA) 5 MG tablet Take 1.5 tablets (7.5 mg total) by mouth daily. 03/21/23  Yes Laurey Morale, MD  ELIQUIS 5 MG TABS tablet TAKE 1 TABLET BY MOUTH TWICE A DAY 11/19/22  Yes Laurey Morale, MD  FARXIGA 10 MG TABS tablet TAKE 1 TABLET BY MOUTH DAILY BEFORE BREAKFAST. 11/28/22  Yes Laurey Morale, MD  fluconazole (DIFLUCAN) 200 MG tablet Take 200 mg by mouth once a week.   Yes [provider]  pantoprazole (PROTONIX) 40 MG tablet Take 1 tablet (40 mg total) by mouth 2 (two) times daily. 06/10/23 06/09/24 Yes Jenel Lucks, MD  spironolactone (ALDACTONE) 25 MG tablet Take 1 tablet (25 mg total) by mouth daily. 03/01/23  Yes Laurey Morale, MD  valsartan (DIOVAN) 40 MG tablet TAKE 1 TABLET BY MOUTH EVERY DAY 04/19/23  Yes Laurey Morale, MD      Allergies    Patient has no known allergies.    Review of Systems   See HPI  Physical Exam Updated Vital Signs BP 118/74 (BP  Location: Right Arm)   Pulse 72   Temp 98 F (36.7 C)   Resp 19   Ht 6\' 7"  (2.007 m)   Wt 97.5 kg   SpO2 94%   BMI 24.22 kg/m  Physical Exam  ED Results / Procedures / Treatments   Labs (all labs ordered are listed, but only abnormal results are displayed) Labs Reviewed  BASIC METABOLIC PANEL - Abnormal; Notable for the following components:      Result Value   Glucose, Bld 107 (*)    All other components within normal limits  CBC  SURGICAL PATHOLOGY    EKG None  Radiology No results found.  Procedures Procedures    Medications Ordered in ED Medications  fentaNYL (SUBLIMAZE) injection 25-50 mcg (has no administration in time range)  acetaminophen (OFIRMEV) IV 1,000 mg (has no administration in time range)  amisulpride (BARHEMSYS) injection 10 mg (10 mg Intravenous Given 06/10/23 0116)  amisulpride (BARHEMSYS) 5 MG/2ML injection (  Duplicate 06/10/23 0126)    ED Course/ Medical Decision Making/ A&P Clinical Course as of 06/10/23 0129  Sun Jun 09, 2023  2316 Discussed with Dr. Tomasa Rand of GI.  He agreed that his story is concerning for food bolus.  Advised that he will come in and evaluate patient will likely need EGD.  Advised him maintain n.p.o. status  and recommended glucagon in the meantime. [JR]    Clinical Course User Index [JR] Gareth Eagle, PA-C                                 Medical Decision Making Amount and/or Complexity of Data Reviewed Labs: ordered.  Risk Decision regarding hospitalization.   52 year old well-appearing male presenting for food bolus.  Exam was unremarkable.  DDx includes esophageal stricture, ingested foreign body in the airway, food impaction, esophagitis, other.  Story of medical history concerning for esophageal stricture and/or food impaction.  Discussed patient with Dr. Tomasa Rand of gastroenterology.  Advised that he would come and evaluate patient at bedside and stated that he will plan for EGD later tonight and to  maintain n.p.o. status.  On reassessment patient remained well, no acute distress and hemodynamically stable.  Transferred to the OR.        Final Clinical Impression(s) / ED Diagnoses Final diagnoses:  Food bolus obstruction of intestine (HCC)    Rx / DC Orders ED Discharge Orders          Ordered    pantoprazole (PROTONIX) 40 MG tablet  2 times daily        06/10/23 0110              Gareth Eagle, PA-C 06/10/23 Claris Pong    Pricilla Loveless, MD 06/12/23 1243

## 2023-06-09 NOTE — ED Notes (Signed)
Pt to endoscopy via wheelchair at this time with RN. Belongings placed in bag and remain with pt.

## 2023-06-09 NOTE — ED Triage Notes (Signed)
Pt has esophageal stenosis and ws eating chicken today. Pt feel it is stuck in his esophagus. Pt unable tot swallow secretions. Denies trouble breathing. Pt had emesis x1 but still feels it. Unable to vomit again.

## 2023-06-09 NOTE — Consult Note (Signed)
Consultation  Referring Provider:     Dr. Bebe Shaggy Primary Care Physician:  Darrow Bussing, MD Primary Gastroenterologist:       N/A  Reason for Consultation:    Esophageal food impaction         HPI:   Ricardo Jones is a 52 y.o. male with a history of genetic cardiomyopathy, a-flutter/history of embolic stroke, also history of peptic stricture and remote food impaction in 2012, who presented to the emergency department this evening with symptoms of esophageal food impaction. Patient states he was eating a fried chicken sandwich when he felt a bite get stuck.  He has not been able to swallow secretions since then. He denies any symptoms of chest pain or shortness of breath.  He has a history of chronic dysphagia, which has been getting progressively worse.  He frequently has the sensation that food is stuck when he eats, often requiring him to bring food back up.  He has a history of a prior impaction in 2012 and underwent dilation at that time.  He denies any chronic symptoms of heartburn or acid regurgitation.  He does not take any PPI or acid reducing medicine.  He takes Eliquis for his atrial fibrillation, last dose this morning.  Past Medical History:  Diagnosis Date   Acute right MCA stroke (HCC) 09/25/2021   s/p thrombectomy   Bradycardia    asymptomatic   Chronic systolic CHF (congestive heart failure), NYHA class 2 (HCC)    Complete heart block (HCC)    s/p CRT-D   Hyperlipidemia LDL goal <100    S/P dilatation of esophageal stricture    Typical atrial flutter [facility] [place])     Past Surgical History:  Procedure Laterality Date   A-FLUTTER ABLATION N/A 06/27/2022   Procedure: A-FLUTTER ABLATION;  Surgeon: Marinus Maw, MD;  Location: [facility] INVASIVE CV LAB;  Service: Cardiovascular;  Laterality: N/A;   BIV ICD INSERTION CRT-D N/A 09/08/2018   Procedure: BIV ICD INSERTION CRT-D;  Surgeon: Marinus Maw, MD;  Location: [facility] INVASIVE CV LAB;  Service: Cardiovascular;  Laterality:  N/A;   IR CT HEAD LTD  09/25/2021   IR PERCUTANEOUS ART THROMBECTOMY/INFUSION INTRACRANIAL INC DIAG ANGIO  09/25/2021   RADIOLOGY WITH ANESTHESIA N/A 09/25/2021   Procedure: IR WITH ANESTHESIA;  Surgeon: Julieanne Cotton, MD;  Location: [facility] OR;  Service: Radiology;  Laterality: N/A;   THROAT SURGERY  2012    Family History  Problem Relation Age of Onset   Asthma Mother    Cancer Maternal Grandmother    Cancer Maternal Grandfather    Cancer Paternal Grandmother    Heart failure Maternal Uncle    Bradycardia Other        multiple family members with pacemakers   Heart failure Other      Social History   Tobacco Use   Smoking status: Never   Smokeless tobacco: Never  Vaping Use   Vaping status: Never Used  Substance Use Topics   Alcohol use: Yes    Comment: occassionally   Drug use: No    Prior to Admission medications   Medication Sig Start Date End Date Taking? Authorizing Provider  atorvastatin (LIPITOR) 40 MG tablet TAKE 1 TABLET BY MOUTH EVERY DAY 08/23/22   Laurey Morale, MD  bisoprolol (ZEBETA) 5 MG tablet Take 1.5 tablets (7.5 mg total) by mouth daily. 03/21/23   Laurey Morale, MD  ELIQUIS 5 MG TABS tablet TAKE 1 TABLET BY MOUTH TWICE A DAY 11/19/22  Laurey Morale, MD  FARXIGA 10 MG TABS tablet TAKE 1 TABLET BY MOUTH DAILY BEFORE BREAKFAST. 11/28/22   Laurey Morale, MD  fluconazole (DIFLUCAN) 200 MG tablet Take 200 mg by mouth once a week.    [provider]  spironolactone (ALDACTONE) 25 MG tablet Take 1 tablet (25 mg total) by mouth daily. 03/01/23   Laurey Morale, MD  valsartan (DIOVAN) 40 MG tablet TAKE 1 TABLET BY MOUTH EVERY DAY 04/19/23   Laurey Morale, MD    No current facility-administered medications for this encounter.   Current Outpatient Medications  Medication Sig Dispense Refill   atorvastatin (LIPITOR) 40 MG tablet TAKE 1 TABLET BY MOUTH EVERY DAY 90 tablet 3   bisoprolol (ZEBETA) 5 MG tablet Take 1.5 tablets (7.5 mg total) by  mouth daily. 45 tablet 5   ELIQUIS 5 MG TABS tablet TAKE 1 TABLET BY MOUTH TWICE A DAY 180 tablet 3   FARXIGA 10 MG TABS tablet TAKE 1 TABLET BY MOUTH DAILY BEFORE BREAKFAST. 30 tablet 11   fluconazole (DIFLUCAN) 200 MG tablet Take 200 mg by mouth once a week.     spironolactone (ALDACTONE) 25 MG tablet Take 1 tablet (25 mg total) by mouth daily. 90 tablet 3   valsartan (DIOVAN) 40 MG tablet TAKE 1 TABLET BY MOUTH EVERY DAY 90 tablet 0    Allergies as of 06/09/2023   (No Known Allergies)     Review of Systems:    As per HPI, otherwise negative    Physical Exam:  Vital signs in last 24 hours: Temp:  [98.8 F (37.1 C)] 98.8 F (37.1 C) (11/24 2200) Pulse Rate:  [79] 79 (11/24 2200) Resp:  [18] 18 (11/24 2200) BP: (120)/(86) 120/86 (11/24 2200) SpO2:  [100 %] 100 % (11/24 2200) Weight:  [97.5 kg] 97.5 kg (11/24 2204)   General:   Pleasant Caucasian male in NAD Head:  Normocephalic and atraumatic. Eyes:   No icterus.   Conjunctiva pink. Ears:  Normal auditory acuity. Neck:  Supple Lungs:  Respirations even and unlabored. Lungs clear to auscultation bilaterally.   No wheezes, crackles, or rhonchi.  Heart:  Regular rate and rhythm; no MRG Abdomen:  Soft, nondistended, nontender. Normal bowel sounds. No appreciable masses or hepatomegaly.  Rectal:  Not performed.  Msk:  Symmetrical without gross deformities.  Extremities:  Without edema. Neurologic:  Alert and  oriented x4;  grossly normal neurologically. Skin:  Intact without significant lesions or rashes. Psych:  Alert and cooperative. Normal affect.  LAB RESULTS: Recent Labs    06/09/23 2243  WBC 8.3  HGB 14.1  HCT 42.6  PLT 157   BMET Recent Labs    06/09/23 2243  NA 138  K 4.5  CL 107  CO2 25  GLUCOSE 107*  BUN 18  CREATININE 1.08  CALCIUM 9.3   LFT No results for input(s): "PROT", "ALBUMIN", "AST", "ALT", "ALKPHOS", "BILITOT", "BILIDIR", "IBILI" in the last 72 hours. PT/INR No results for input(s):  "LABPROT", "INR" in the last 72 hours.  STUDIES: No results found.   PREVIOUS ENDOSCOPIES:            EGD 2012 Meat bolus in distal esophagus, removed with Lucina Mellow net Stricture at GEJ, dilated up to 18 mm   Impression / Plan:   52 year old male with genetic cardiomyopathy, atrial flutter and history of stroke, presenting with acute esophageal food impaction.  He has a prior history of food impaction and chronic dysphagia attributed to peptic  stricture, but no chronic GERD symptoms. Will plan for emergent EGD this evening to relieve impaction. Patient took Eliquis earlier today.  Will not plan for any dilation today because of this.  We discussed the details, risks and benefits of upper endoscopy with disimpaction and the patient agreed to proceed.  Thanks   LOS: 0 days   Jenel Lucks  06/09/2023, 11:52 PM

## 2023-06-10 ENCOUNTER — Encounter (HOSPITAL_COMMUNITY)
Admission: EM | Disposition: A | Discharge status: Home / Self Care | Payer: Self-pay | Source: Home / Self Care | Attending: Emergency Medicine

## 2023-06-10 ENCOUNTER — Emergency Department (HOSPITAL_COMMUNITY): Payer: BC Managed Care – PPO | Admitting: Certified Registered Nurse Anesthetist

## 2023-06-10 DIAGNOSIS — K56699 Other intestinal obstruction unspecified as to partial versus complete obstruction: Secondary | ICD-10-CM

## 2023-06-10 HISTORY — PX: BIOPSY: SHX5522

## 2023-06-10 HISTORY — PX: FOREIGN BODY REMOVAL: SHX962

## 2023-06-10 HISTORY — PX: ESOPHAGOGASTRODUODENOSCOPY: SHX5428

## 2023-06-10 SURGERY — EGD (ESOPHAGOGASTRODUODENOSCOPY)
Anesthesia: General

## 2023-06-10 MED ORDER — PROPOFOL 10 MG/ML IV BOLUS
INTRAVENOUS | Status: DC | PRN
  Administered 2023-06-10: 200 mg via INTRAVENOUS

## 2023-06-10 MED ORDER — ACETAMINOPHEN 10 MG/ML IV SOLN: 1000.0000 mg | Freq: Once | INTRAVENOUS | Status: DC | PRN

## 2023-06-10 MED ORDER — ONDANSETRON HCL 4 MG/2ML IJ SOLN
INTRAMUSCULAR | Status: DC | PRN
  Administered 2023-06-10: 4 mg via INTRAVENOUS

## 2023-06-10 MED ORDER — LIDOCAINE HCL (CARDIAC) PF 100 MG/5ML IV SOSY
PREFILLED_SYRINGE | INTRAVENOUS | Status: DC | PRN
  Administered 2023-06-10: 60 mg via INTRAVENOUS

## 2023-06-10 MED ORDER — FENTANYL CITRATE (PF) 100 MCG/2ML IJ SOLN: 25.0000 ug | INTRAMUSCULAR | Status: DC | PRN

## 2023-06-10 MED ORDER — AMISULPRIDE (ANTIEMETIC) 5 MG/2ML IV SOLN
INTRAVENOUS | Status: AC
  Filled 2023-06-10: qty 4

## 2023-06-10 MED ORDER — SUCCINYLCHOLINE CHLORIDE 200 MG/10ML IV SOSY
PREFILLED_SYRINGE | INTRAVENOUS | Status: DC | PRN
  Administered 2023-06-10: 160 mg via INTRAVENOUS

## 2023-06-10 MED ORDER — PHENYLEPHRINE HCL (PRESSORS) 10 MG/ML IV SOLN
INTRAVENOUS | Status: DC | PRN
  Administered 2023-06-10 (×2): 80 ug via INTRAVENOUS

## 2023-06-10 MED ORDER — SODIUM CHLORIDE 0.9 % IV SOLN: INTRAVENOUS | Status: DC | PRN

## 2023-06-10 MED ORDER — DEXAMETHASONE SODIUM PHOSPHATE 10 MG/ML IJ SOLN
INTRAMUSCULAR | Status: DC | PRN
  Administered 2023-06-10: 10 mg via INTRAVENOUS

## 2023-06-10 MED ORDER — AMISULPRIDE (ANTIEMETIC) 5 MG/2ML IV SOLN
10.0000 mg | Freq: Once | INTRAVENOUS | Status: AC | PRN
  Administered 2023-06-10: 10 mg via INTRAVENOUS

## 2023-06-10 MED ORDER — GLYCOPYRROLATE 0.2 MG/ML IJ SOLN
INTRAMUSCULAR | Status: DC | PRN
  Administered 2023-06-10: .2 mg via INTRAVENOUS

## 2023-06-10 MED ORDER — PANTOPRAZOLE SODIUM 40 MG PO TBEC: 40.0000 mg | DELAYED_RELEASE_TABLET | Freq: Two times a day (BID) | ORAL | 1 refills | Status: DC

## 2023-06-10 NOTE — Transfer of Care (Signed)
Immediate Anesthesia Transfer of Care Note  Patient: Ricardo Jones  Procedure(s) Performed: ESOPHAGOGASTRODUODENOSCOPY (EGD) FOREIGN BODY REMOVAL BIOPSY  Patient Location: PACU  Anesthesia Type:General  Level of Consciousness: awake, alert , and oriented  Airway & Oxygen Therapy: Patient Spontanous Breathing and Patient connected to nasal cannula oxygen  Post-op Assessment: Report given to RN, Post -op Vital signs reviewed and stable, and Patient moving all extremities  Post vital signs: Reviewed and stable  Last Vitals:  Vitals Value Taken Time  BP 131/97 06/10/23 0106  Temp 36.7 C 06/10/23 0105  Pulse 74 06/10/23 0114  Resp 23 06/10/23 0114  SpO2 92 % 06/10/23 0114  Vitals shown include unfiled device data.  Last Pain:  Vitals:   06/10/23 0105  TempSrc:   PainSc: 0-No pain         Complications: No notable events documented.

## 2023-06-10 NOTE — Anesthesia Procedure Notes (Signed)
Procedure Name: Intubation Date/Time: 06/10/2023 12:25 AM  Performed by: Lux Skilton T, CRNAPre-anesthesia Checklist: Patient identified, Emergency Drugs available, Suction available and Patient being monitored Patient Re-evaluated:Patient Re-evaluated prior to induction Oxygen Delivery Method: Circle system utilized Preoxygenation: Pre-oxygenation with 100% oxygen Induction Type: IV induction, Rapid sequence and Cricoid Pressure applied Ventilation: Mask ventilation without difficulty Laryngoscope Size: Mac and 4 Grade View: Grade I Tube type: Oral Tube size: 7.0 mm Number of attempts: 1 Airway Equipment and Method: Stylet and Oral airway Placement Confirmation: ETT inserted through vocal cords under direct vision, positive ETCO2 and breath sounds checked- equal and bilateral Secured at: 23 cm Tube secured with: Tape Dental Injury: Teeth and Oropharynx as per pre-operative assessment

## 2023-06-10 NOTE — Anesthesia Preprocedure Evaluation (Addendum)
Anesthesia Evaluation  Patient identified by MRN, date of birth, ID band Patient awake    Reviewed: Allergy & Precautions, NPO status , Patient's Chart, lab work & pertinent test results  History of Anesthesia Complications Negative for: history of anesthetic complications  Airway Mallampati: I  TM Distance: >3 FB Neck ROM: Full    Dental  (+) Teeth Intact, Dental Advisory Given   Pulmonary neg shortness of breath, neg sleep apnea, neg COPD, neg recent URI   breath sounds clear to auscultation       Cardiovascular (-) angina +CHF  (-) Past MI + dysrhythmias + pacemaker + Cardiac Defibrillator  Rhythm:Regular   1. Left ventricular ejection fraction, by estimation, is 40 to 45%. Left  ventricular ejection fraction by PLAX is 40 %. The left ventricle has  mildly decreased function. The left ventricle demonstrates global  hypokinesis. The left ventricular internal  cavity size was mildly dilated. Left ventricular diastolic parameters are  indeterminate. The average left ventricular global longitudinal strain is  -13.2 %.   2. Right ventricular systolic function is normal. The right ventricular  size is mildly enlarged. There is normal pulmonary artery systolic  pressure.   3. Left atrial size was moderately dilated.   4. The mitral valve is normal in structure. No evidence of mitral valve  regurgitation. No evidence of mitral stenosis.   5. Tricuspid valve regurgitation is mild to moderate.   6. The aortic valve is tricuspid. Aortic valve regurgitation is not  visualized. No aortic stenosis is present.   7. The inferior vena cava is normal in size with greater than 50%  respiratory variability, suggesting right atrial pressure of 3 mmHg.     Neuro/Psych neg Seizures CVA    GI/Hepatic Neg liver ROS,,,Food impaction   Endo/Other  negative endocrine ROS    Renal/GU negative Renal ROSLab Results      Component                 Value               Date                      NA                       138                 06/09/2023                K                        4.5                 06/09/2023                CO2                      25                  06/09/2023                GLUCOSE                  107 (H)             06/09/2023                BUN  18                  06/09/2023                CREATININE               1.08                06/09/2023                CALCIUM                  9.3                 06/09/2023                EGFR                     101                 06/19/2022                GFRNONAA                 >60                 06/09/2023                Musculoskeletal negative musculoskeletal ROS (+)    Abdominal   Peds  Hematology Lab Results      Component                Value               Date                      WBC                      8.3                 06/09/2023                HGB                      14.1                06/09/2023                HCT                      42.6                06/09/2023                MCV                      90.3                06/09/2023                PLT                      157                 06/09/2023            eliquis   Anesthesia Other Findings   Reproductive/Obstetrics  Anesthesia Physical Anesthesia Plan  ASA: 3 and emergent  Anesthesia Plan: General   Post-op Pain Management: Minimal or no pain anticipated   Induction: Intravenous, Rapid sequence and Cricoid pressure planned  PONV Risk Score and Plan: 2 and Ondansetron and Dexamethasone  Airway Management Planned: Oral ETT  Additional Equipment: None  Intra-op Plan:   Post-operative Plan: Extubation in OR  Informed Consent: I have reviewed the patients History and Physical, chart, labs and discussed the procedure including the risks, benefits and alternatives for the proposed  anesthesia with the patient or authorized representative who has indicated his/her understanding and acceptance.     Dental advisory given  Plan Discussed with: CRNA  Anesthesia Plan Comments:         Anesthesia Quick Evaluation

## 2023-06-10 NOTE — Op Note (Signed)
[facility] Patient Name: Ricardo Jones Procedure Date : 06/09/2023 MRN: [id number] Attending MD: Dub Amis. Tomasa Rand , MD, [id number] Date of Birth: 04-12-1971 CSN: [id number] Age: 52 Admit Type: Inpatient Procedure:                Upper GI endoscopy Indications:              Foreign body in the esophagus Providers:                Lorin Picket E. Tomasa Rand, MD, Stephens Shire RN, RN,                            Salley Scarlet, Technician, Criss Rosales, CRNA Referring MD:              Medicines:                Monitored Anesthesia Care Complications:            No immediate complications. Estimated Blood Loss:     Estimated blood loss was minimal. Procedure:                Pre-Anesthesia Assessment:                           - Prior to the procedure, a History and Physical                            was performed, and patient medications and                            allergies were reviewed. The patient's tolerance of                            previous anesthesia was also reviewed. The risks                            and benefits of the procedure and the sedation                            options and risks were discussed with the patient.                            All questions were answered, and informed consent                            was obtained. Prior Anticoagulants: The patient has                            taken Eliquis (apixaban), last dose was day of                            procedure. ASA Grade Assessment: III - A patient                            with severe systemic disease. After reviewing the  risks and benefits, the patient was deemed in                            satisfactory condition to undergo the procedure.                           After obtaining informed consent, the endoscope was                            passed under direct vision. Throughout the                            procedure, the patient's blood pressure,  pulse, and                            oxygen saturations were monitored continuously. The                            [id number]-H190 ([id number]) Olympus endoscope was introduced                            through the mouth, and advanced to the second part                            of duodenum. The upper GI endoscopy was                            accomplished without difficulty. The patient                            tolerated the procedure well. Scope In: Scope Out: Findings:      Food was found in the lower third of the esophagus. Removal was       accomplished with a Raptor grasping device and Lucina Mellow net, eventually       allowing the broken food bolus to be pushed into the gastric lumen.       Estimated blood loss was minimal.      One benign-appearing, intrinsic moderate stenosis was found at the       gastroesophageal junction. This stenosis measured 1.1 cm (inner       diameter) x less than one cm (in length). The stenosis was traversed.      Mucosal changes including ringed esophagus and small-caliber esophagus       were found in the entire esophagus. Esophageal findings were graded       using the Eosinophilic Esophagitis Endoscopic Reference Score       (EoE-EREFS) as: Edema Grade 1 Present (decreased clarity or absence of       vascular markings), Rings Grade 2 Moderate (distinct rings that do not       occlude passage of diagnostic 8-10 mm endoscope), Exudates Grade 0 None       (no white lesions seen), Furrows Grade 0 None (no vertical lines seen)       and Stricture present (11 mm luminal diameter). Biopsies were obtained       from the proximal and distal esophagus with cold forceps for histology  of suspected eosinophilic esophagitis. Estimated blood loss was minimal.      A small amount of food (residue) was found in the gastric fundus.      The exam of the stomach was otherwise normal.      The examined duodenum was normal. Impression:               - Food in the lower  third of the esophagus. Removal                            was successful.                           - Benign-appearing esophageal stenosis.                           - Esophageal mucosal changes suggestive of                            eosinophilic esophagitis.                           - A small amount of food (residue) in the stomach.                           - Normal examined duodenum.                           - Biopsies were taken with a cold forceps for                            evaluation of eosinophilic esophagitis. Moderate Sedation:      N/A Recommendation:           - Patient has a contact number available for                            emergencies. The signs and symptoms of potential                            delayed complications were discussed with the                            patient. Return to normal activities tomorrow.                            Written discharge instructions were provided to the                            patient.                           - Full liquid diet today.                           - Resume Eliquis (apixaban) at prior dose tomorrow.                           -  Use Protonix (pantoprazole) 40 mg PO BID for 8                            weeks, then once daily.                           - Await pathology results.                           - Further follow up/evaluation will be based on                            pathology results. Procedure Code(s):        --- Professional ---                           [phone], Esophagogastroduodenoscopy, flexible,                            transoral; with removal of foreign body(s)                           43239, Esophagogastroduodenoscopy, flexible,                            transoral; with biopsy, single or multiple Diagnosis Code(s):        --- Professional ---                           U04.540J, Food in esophagus causing other injury,                            initial encounter                            K22.2, Esophageal obstruction                           K22.89, Other specified disease of esophagus                           T18.2XXA, Foreign body in stomach, initial encounter                           T18.108A, Unspecified foreign body in esophagus                            causing other injury, initial encounter CPT copyright 2022 [employer]. All rights reserved. The codes documented in this report are preliminary and upon coder review may  be revised to meet current compliance requirements. Kinsleigh Ludolph E. Tomasa Rand, MD 06/10/2023 1:02:39 AM This report has been signed electronically. Number of Addenda: 0

## 2023-06-10 NOTE — Progress Notes (Signed)
Patient received from Endo on 3L[facility]; oxygen turned off to see if able to maintain on RA; pt reports very sore and "spasming" throat; bilateral lung sounds clear, no stridor noted and breathing unlabored and regular with no signs of distress; noticed that his fingernails on both hands were cyanotic, cool to touch; patient still on RA, oxygen saturation around 92-92%; Patient and wife said that they don't think they normally look like that, but the patient does work with dyes sometimes; Rokoshi Flowers, CRNA was still at bedside when this was noticed, so I asked him to come check. He said he didn't think they had looked like that before the procedure, but was unsure; he recommended I call Dr. Maple Hudson. I called Dr. Maple Hudson and relayed patient vitals, reports of throat discomfort, and observations by myself, Raynelle Fanning, RN, and Rokoshi, CRNA. Dr. Maple Hudson said that there was nothing done or given during the procedure that would explain an acute reason for this and that as long as patient was at or above 92% on RA and in no distress the patient could be discharged home. Dr. Maple Hudson said that he did not need to come to PACU to see the patient before discharge as there were no interventions to be done. Patient was still feeling anxious about his throat, so I kept the patient in PACU. I taught him the incentive spirometer, which improved his oxygen to where it would peak up to 96% before returning to 93-94%. Patient tolerated ice chips; after some time I had patient do a lap of the PACU to make sure he felt comfortable before being discharged. Patient tolerated well, breathing easy and regular, no signs of distress, gait steady. Patient said he felt ready to go home and his wife agreed. Patient and wife thankful for service.

## 2023-06-11 LAB — SURGICAL PATHOLOGY

## 2023-06-12 ENCOUNTER — Encounter (HOSPITAL_COMMUNITY): Payer: Self-pay | Admitting: Gastroenterology

## 2023-06-18 NOTE — Progress Notes (Signed)
Mr. Avanessian,  Your esophageal biopsies were equivocal for eosinophilic esophagitis.  Although the eosinophil level met criteria in the distal esophagus, the eosinophil burden in the proximal esophagus did not.  Eosinophilia can be seen secondary to acid reflux, particularly when it is more predominant in the distal esophagus.    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  I would like to see you in clinic to discuss this further and to schedule an outpatient upper endoscopy with dilation.   Please continue to take the Protonix twice daily for 8 weeks, and then once daily until your repeat upper endoscopy.  Bonita Quin,  Can you please book a follow up office visit with me, next available?

## 2023-06-19 ENCOUNTER — Encounter: Payer: BC Managed Care – PPO | Admitting: Internal Medicine

## 2023-06-19 ENCOUNTER — Encounter: Payer: Self-pay | Admitting: Internal Medicine

## 2023-06-19 ENCOUNTER — Ambulatory Visit: Payer: BC Managed Care – PPO | Attending: Internal Medicine | Admitting: Internal Medicine

## 2023-06-19 VITALS — BP 140/80 | HR 74 | Ht 79.0 in | Wt 223.4 lb

## 2023-06-19 DIAGNOSIS — I4892 Unspecified atrial flutter: Secondary | ICD-10-CM | POA: Diagnosis not present

## 2023-06-19 DIAGNOSIS — Z95810 Presence of automatic (implantable) cardiac defibrillator: Secondary | ICD-10-CM

## 2023-06-19 DIAGNOSIS — I442 Atrioventricular block, complete: Secondary | ICD-10-CM

## 2023-06-19 DIAGNOSIS — I493 Ventricular premature depolarization: Secondary | ICD-10-CM

## 2023-06-19 DIAGNOSIS — I425 Other restrictive cardiomyopathy: Secondary | ICD-10-CM | POA: Diagnosis not present

## 2023-06-19 LAB — CUP PACEART INCLINIC DEVICE CHECK
Date Time Interrogation Session: 20241204150115
Implantable Lead Connection Status: 753985
Implantable Lead Connection Status: 753985
Implantable Lead Connection Status: 753985
Implantable Lead Implant Date: 20200224
Implantable Lead Implant Date: 20200224
Implantable Lead Implant Date: 20200224
Implantable Lead Location: 753858
Implantable Lead Location: 753859
Implantable Lead Location: 753860
Implantable Lead Model: 5076
Implantable Lead Model: 6935
Implantable Pulse Generator Implant Date: 20200224

## 2023-06-19 NOTE — Progress Notes (Signed)
Okay     Patient Care Team: Darrow Bussing, MD as PCP - General (Family Medicine) Nahser, Deloris Ping, MD as PCP - Cardiology (Cardiology) Duke Salvia, MD as PCP - Electrophysiology (Cardiology) Hillis Range, MD (Inactive) as Attending Physician (Cardiology)   HPI  Ricardo Jones is a 52 y.o. 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  High resolution CT scan unrevealing of sarcoid.  Progressive symptomatic bradycardia with his complete heart block received CRT -D 2/20.  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      3/23 presented with left-sided weakness and aphasia, right MCA stroke treated with TNKase and thrombectomy.  He was discharged on apixaban and atorvastatin with a plan to initiate GDMT as an outpatient which has been done with close follow-up with the heart failure clinic.  He is currently on Entresto bisoprolol and spironolactone.  Some of fuzziness of head may be associated with lower blood pressure.  Overall sense of weakness from the stroke and he is left-handed and some loss of left hand dexterity   GT-EP lab 12/23 diffuse atrial scarring not mappable flutters.  Seen today with concerns related to the fact that his persistence of atrial flutter was not detected by his device (see below)  He has perhaps some symptoms with his persistence of atrial flutter.  Perhaps  less exercise tolerance.  No associated palpitations and his Lourena Simmonds monitor is unable to detect the difference.    DATE TEST EF    7/15 Echo   50-55 % Severe RAE/LAE  5/18 Echo  40-45 % Mild RAE/LAE  10/19 Echo  40-45%    11/19 cMRI   Severe BAE DGE+>Sarcoid  12/19 cPET   + Consistent Sarcoid  8/20 Echo  50-55%   1/23 Echo  45-50%   3/23 Echo  30-35% Severe BAE  3/24 Echo  40-45%       Date Cr K Hgb  6/20 1.01 4.7 14.3 (2/20)  1/24 0.89 4.9 14.9     Thromboembolic risk factors (CHF-1) for a CHADSVASc Score of sort of 1      Records and Results Reviewed*   Past Medical History:  Diagnosis Date   Acute right MCA stroke (HCC) 09/25/2021   s/p thrombectomy   Bradycardia    asymptomatic   Chronic systolic CHF (congestive heart failure), NYHA class 2 (HCC)    Complete heart block (HCC)    s/p CRT-D   Hyperlipidemia LDL goal <100    S/P dilatation of esophageal stricture    Typical atrial flutter (HCC)     Past Surgical History:  Procedure Laterality Date   A-FLUTTER ABLATION N/A 06/27/2022   Procedure: A-FLUTTER ABLATION;  Surgeon: Marinus Maw, MD;  Location: [facility] INVASIVE CV LAB;  Service: Cardiovascular;  Laterality: N/A;   BIOPSY  06/10/2023   Procedure: BIOPSY;  Surgeon: Jenel Lucks, MD;  Location: [facility] ENDOSCOPY;  Service: Gastroenterology;;  BIV ICD INSERTION CRT-D N/A 09/08/2018   Procedure: BIV ICD INSERTION CRT-D;  Surgeon: Marinus Maw, MD;  Location: [facility] INVASIVE CV LAB;  Service: Cardiovascular;  Laterality: N/A;   ESOPHAGOGASTRODUODENOSCOPY N/A 06/10/2023   Procedure: ESOPHAGOGASTRODUODENOSCOPY (EGD);  Surgeon: Jenel Lucks, MD;  Location: [facility] ENDOSCOPY;  Service: Gastroenterology;  Laterality: N/A;   FOREIGN BODY REMOVAL  06/10/2023   Procedure: FOREIGN BODY REMOVAL;  Surgeon: Jenel Lucks, MD;  Location: [facility] ENDOSCOPY;  Service: Gastroenterology;;   IR CT HEAD LTD  09/25/2021   IR PERCUTANEOUS ART THROMBECTOMY/INFUSION INTRACRANIAL  INC DIAG ANGIO  09/25/2021   RADIOLOGY WITH ANESTHESIA N/A 09/25/2021   Procedure: IR WITH ANESTHESIA;  Surgeon: Julieanne Cotton, MD;  Location: [facility] OR;  Service: Radiology;  Laterality: N/A;   THROAT SURGERY  2012    Current Meds  Medication Sig   atorvastatin (LIPITOR) 40 MG tablet TAKE 1 TABLET BY MOUTH EVERY DAY   bisoprolol (ZEBETA) 5 MG tablet Take 1.5 tablets (7.5 mg total) by mouth daily.   ELIQUIS 5 MG TABS tablet TAKE 1 TABLET BY MOUTH TWICE A DAY   FARXIGA 10 MG TABS tablet TAKE 1 TABLET BY MOUTH DAILY BEFORE BREAKFAST.   fluconazole (DIFLUCAN) 200 MG tablet Take 200 mg by mouth once a week.   pantoprazole (PROTONIX) 40 MG tablet Take 1 tablet (40 mg total) by mouth 2 (two) times daily.   spironolactone (ALDACTONE) 25 MG tablet Take 1 tablet (25 mg total) by mouth daily.   valsartan (DIOVAN) 40 MG tablet TAKE 1 TABLET BY MOUTH EVERY DAY    No Known Allergies    Review of Systems negative except from HPI and PMH  Physical Exam BP (!) 140/80 (BP Location: Left Arm, Patient Position: Sitting, Cuff Size: Large)   Pulse 74   Ht 6\' 7"  (2.007 m)   Wt 223 lb 6.4 oz (101.3 kg)   SpO2 98%   BMI 25.17 kg/m  Well developed and well nourished in no acute distress HENT normal Neck supple with JVP-flat Clear Device pocket well healed; without hematoma or erythema.  There is no tethering  Regular rate and rhythm, no  gallop No murmur Abd-soft with active BS No Clubbing cyanosis  edema Skin-warm and dry A & Oriented  Grossly normal sensory and motor function  ECG ventricular pacing with basically isoelectric waves, no atrial pacing spikes seen  Device function is normal. Programming changes wireless transmission was activated for atrial fibrillation of greater than 24 hours duration see Paceart for details story is basic stuff looks good OptiVol is baseline normal (   Assessment and  Plan  Cardiomyopathy-LMN   Complete heart block  Atrial flutter progressively slowing  more recently occurring at a rapid rate  CRT-D   PVCs   Stroke  Recurrent atrial flutter efforts to pace terminate were unsuccessful, converted to faster flutters at 140 ms then to a different flutter at about 200 ms.  50 Hz pacing is also failed to terminate.  Will set up for cardioversion.  Discussed antiarrhythmic drugs as well as ablation as options.  Have reached out to Dr Berneice Gandy to consider the latter he will reach out to the patient..  Interestingly, the voltage on his electrocardiogram from his atrial arrhythmias leads is almost undetectable.  I went back to 2019 when this process started and even then his voltages were quite low.  No bleeding.  Continue him on Eliquis.  Blood work 11/24 was normal.

## 2023-06-19 NOTE — H&P (View-Only) (Signed)
 Okay     Patient Care Team: Darrow Bussing, MD as PCP - General (Family Medicine) Nahser, Deloris Ping, MD as PCP - Cardiology (Cardiology) Duke Salvia, MD as PCP - Electrophysiology (Cardiology) Hillis Range, MD (Inactive) as Attending Physician (Cardiology)   HPI  Ricardo Jones is a 52 y.o. 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  High resolution CT scan unrevealing of sarcoid.  Progressive symptomatic bradycardia with his complete heart block received CRT -D 2/20.  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      3/23 presented with left-sided weakness and aphasia, right MCA stroke treated with TNKase and thrombectomy.  He was discharged on apixaban and atorvastatin with a plan to initiate GDMT as an outpatient which has been done with close follow-up with the heart failure clinic.  He is currently on Entresto bisoprolol and spironolactone.  Some of fuzziness of head may be associated with lower blood pressure.  Overall sense of weakness from the stroke and he is left-handed and some loss of left hand dexterity   GT-EP lab 12/23 diffuse atrial scarring not mappable flutters.  Seen today with concerns related to the fact that his persistence of atrial flutter was not detected by his device (see below)  He has perhaps some symptoms with his persistence of atrial flutter.  Perhaps  less exercise tolerance.  No associated palpitations and his Lourena Simmonds monitor is unable to detect the difference.    DATE TEST EF    7/15 Echo   50-55 % Severe RAE/LAE  5/18 Echo  40-45 % Mild RAE/LAE  10/19 Echo  40-45%    11/19 cMRI   Severe BAE DGE+>Sarcoid  12/19 cPET   + Consistent Sarcoid  8/20 Echo  50-55%   1/23 Echo  45-50%   3/23 Echo  30-35% Severe BAE  3/24 Echo  40-45%       Date Cr K Hgb  6/20 1.01 4.7 14.3 (2/20)  1/24 0.89 4.9 14.9     Thromboembolic risk factors (CHF-1) for a CHADSVASc Score of sort of 1      Records and Results Reviewed*   Past Medical History:  Diagnosis Date   Acute right MCA stroke (HCC) 09/25/2021   s/p thrombectomy   Bradycardia    asymptomatic   Chronic systolic CHF (congestive heart failure), NYHA class 2 (HCC)    Complete heart block (HCC)    s/p CRT-D   Hyperlipidemia LDL goal <100    S/P dilatation of esophageal stricture    Typical atrial flutter (HCC)     Past Surgical History:  Procedure Laterality Date   A-FLUTTER ABLATION N/A 06/27/2022   Procedure: A-FLUTTER ABLATION;  Surgeon: Marinus Maw, MD;  Location: [facility] INVASIVE CV LAB;  Service: Cardiovascular;  Laterality: N/A;   BIOPSY  06/10/2023   Procedure: BIOPSY;  Surgeon: Jenel Lucks, MD;  Location: [facility] ENDOSCOPY;  Service: Gastroenterology;;  BIV ICD INSERTION CRT-D N/A 09/08/2018   Procedure: BIV ICD INSERTION CRT-D;  Surgeon: Marinus Maw, MD;  Location: [facility] INVASIVE CV LAB;  Service: Cardiovascular;  Laterality: N/A;   ESOPHAGOGASTRODUODENOSCOPY N/A 06/10/2023   Procedure: ESOPHAGOGASTRODUODENOSCOPY (EGD);  Surgeon: Jenel Lucks, MD;  Location: [facility] ENDOSCOPY;  Service: Gastroenterology;  Laterality: N/A;   FOREIGN BODY REMOVAL  06/10/2023   Procedure: FOREIGN BODY REMOVAL;  Surgeon: Jenel Lucks, MD;  Location: [facility] ENDOSCOPY;  Service: Gastroenterology;;   IR CT HEAD LTD  09/25/2021   IR PERCUTANEOUS ART THROMBECTOMY/INFUSION INTRACRANIAL  INC DIAG ANGIO  09/25/2021   RADIOLOGY WITH ANESTHESIA N/A 09/25/2021   Procedure: IR WITH ANESTHESIA;  Surgeon: Julieanne Cotton, MD;  Location: [facility] OR;  Service: Radiology;  Laterality: N/A;   THROAT SURGERY  2012    Current Meds  Medication Sig   atorvastatin (LIPITOR) 40 MG tablet TAKE 1 TABLET BY MOUTH EVERY DAY   bisoprolol (ZEBETA) 5 MG tablet Take 1.5 tablets (7.5 mg total) by mouth daily.   ELIQUIS 5 MG TABS tablet TAKE 1 TABLET BY MOUTH TWICE A DAY   FARXIGA 10 MG TABS tablet TAKE 1 TABLET BY MOUTH DAILY BEFORE BREAKFAST.   fluconazole (DIFLUCAN) 200 MG tablet Take 200 mg by mouth once a week.   pantoprazole (PROTONIX) 40 MG tablet Take 1 tablet (40 mg total) by mouth 2 (two) times daily.   spironolactone (ALDACTONE) 25 MG tablet Take 1 tablet (25 mg total) by mouth daily.   valsartan (DIOVAN) 40 MG tablet TAKE 1 TABLET BY MOUTH EVERY DAY    No Known Allergies    Review of Systems negative except from HPI and PMH  Physical Exam BP (!) 140/80 (BP Location: Left Arm, Patient Position: Sitting, Cuff Size: Large)   Pulse 74   Ht 6\' 7"  (2.007 m)   Wt 223 lb 6.4 oz (101.3 kg)   SpO2 98%   BMI 25.17 kg/m  Well developed and well nourished in no acute distress HENT normal Neck supple with JVP-flat Clear Device pocket well healed; without hematoma or erythema.  There is no tethering  Regular rate and rhythm, no  gallop No murmur Abd-soft with active BS No Clubbing cyanosis  edema Skin-warm and dry A & Oriented  Grossly normal sensory and motor function  ECG ventricular pacing with basically isoelectric waves, no atrial pacing spikes seen  Device function is normal. Programming changes wireless transmission was activated for atrial fibrillation of greater than 24 hours duration see Paceart for details story is basic stuff looks good OptiVol is baseline normal (   Assessment and  Plan  Cardiomyopathy-LMN   Complete heart block  Atrial flutter progressively slowing  more recently occurring at a rapid rate  CRT-D   PVCs   Stroke  Recurrent atrial flutter efforts to pace terminate were unsuccessful, converted to faster flutters at 140 ms then to a different flutter at about 200 ms.  50 Hz pacing is also failed to terminate.  Will set up for cardioversion.  Discussed antiarrhythmic drugs as well as ablation as options.  Have reached out to Dr Berneice Gandy to consider the latter he will reach out to the patient..  Interestingly, the voltage on his electrocardiogram from his atrial arrhythmias leads is almost undetectable.  I went back to 2019 when this process started and even then his voltages were quite low.  No bleeding.  Continue him on Eliquis.  Blood work 11/24 was normal.

## 2023-06-19 NOTE — Patient Instructions (Signed)
Medication Instructions:  Your physician recommends that you continue on your current medications as directed. Please refer to the Current Medication list given to you today.  *If you need a refill on your cardiac medications before your next appointment, please call your pharmacy*   Lab Work: None ordered.  If you have labs (blood work) drawn today and your tests are completely normal, you will receive your results only by: MyChart Message (if you have MyChart) OR A paper copy in the mail If you have any lab test that is abnormal or we need to change your treatment, we will call you to review the results.   Testing/Procedures: Your physician has recommended that you have a Cardioversion (DCCV). Electrical Cardioversion uses a jolt of electricity to your heart either through paddles or wired patches attached to your chest. This is a controlled, usually prescheduled, procedure. Defibrillation is done under light anesthesia in the hospital, and you usually go home the day of the procedure. This is done to get your heart back into a normal rhythm. You are not awake for the procedure. Please see the instruction sheet given to you today.    Follow-Up: At [facility], you and your health needs are our priority.  As part of our continuing mission to provide you with exceptional heart care, we have created designated Provider Care Teams.  These Care Teams include your primary Cardiologist (physician) and Advanced Practice Providers (APPs -  Physician Assistants and Nurse Practitioners) who all work together to provide you with the care you need, when you need it.  We recommend signing up for the patient portal called "MyChart".  Sign up information is provided on this After Visit Summary.  MyChart is used to connect with patients for Virtual Visits (Telemedicine).  Patients are able to view lab/test results, encounter notes, upcoming appointments, etc.  Non-urgent messages can be sent to your  provider as well.   To learn more about what you can do with MyChart, go to [email or web address].    Your next appointment:   To be scheduled  Other Instructions    Dear Ricardo Jones  You are scheduled for a Cardioversion on Monday, December 9 with Dr. [name].  Please arrive at the [facility] (Main Entrance A) at [facility]: [address] [place], [place] [address] at 10:30 AM (This time is 1.45 hour(s) before your procedure to ensure your preparation).   Free valet parking service is available. You will check in at ADMITTING.   *Please Note: You will receive a call the day before your procedure to confirm the appointment time.   DIET:  Nothing to eat or drink after midnight except a sip of water with medications (see medication instructions below)  MEDICATION INSTRUCTIONS: !!IF ANY NEW MEDICATIONS ARE STARTED AFTER TODAY, PLEASE NOTIFY YOUR PROVIDER AS SOON AS POSSIBLE!!  FYI: Medications such as Semaglutide (Ozempic, [place]), Tirzepatide (Mounjaro, Zepbound), Dulaglutide (Trulicity), etc ("GLP1 agonists") AND Canagliflozin (Invokana), Dapagliflozin (Farxiga), Empagliflozin (Jardiance), Ertugliflozin (Steglatro), Bexagliflozin [employer]) or any combination with one of these drugs such as Invokamet (Canagliflozin/Metformin), Synjardy (Empagliflozin/Metformin), etc ("SGLT2 inhibitors") must be held around the time of a procedure. This is not a comprehensive list of all of these drugs. Please review all of your medications and talk to your provider if you take any one of these. If you are not sure, ask your provider.   HOLD: Dapagliflozin Marcelline Deist) for 3 days prior to the procedure. Last dose on Friday, December 06.   Hold:  Spironolactone the morning of your procedure    Continue taking your anticoagulant (blood thinner): Apixaban (Eliquis).   You will need to continue this after your procedure until you are told by your provider that it is safe to stop.     LABS:You should not need lab work prior to this procedure.  FYI:  For your safety, and to allow [place] to monitor your vital signs accurately during the surgery/procedure we request: If you have artificial nails, gel coating, SNS etc, please have those removed prior to your surgery/procedure. Not having the nail coverings /polish removed may result in cancellation or delay of your surgery/procedure.  Your support person will be asked to wait in the waiting room during your procedure.  It is OK to have someone drop you off and come back when you are ready to be discharged.  You cannot drive after the procedure and will need someone to drive you home.  Bring your insurance cards.  *Special Note: Every effort is made to have your procedure done on time. Occasionally there are emergencies that occur at the hospital that may cause delays. Please be patient if a delay does occur.

## 2023-06-20 NOTE — Anesthesia Postprocedure Evaluation (Signed)
Anesthesia Post Note  Patient: Ricardo Jones  Procedure(s) Performed: ESOPHAGOGASTRODUODENOSCOPY (EGD) FOREIGN BODY REMOVAL BIOPSY     Patient location during evaluation: PACU Anesthesia Type: General Level of consciousness: awake and alert Pain management: pain level controlled Vital Signs Assessment: post-procedure vital signs reviewed and stable Respiratory status: spontaneous breathing, nonlabored ventilation and respiratory function stable Cardiovascular status: blood pressure returned to baseline and stable Postop Assessment: no apparent nausea or vomiting Anesthetic complications: no   No notable events documented.  Last Vitals:  Vitals:   06/10/23 0205 06/10/23 0220  BP: 110/76 108/70  Pulse: 70 70  Resp: 18 19  Temp:  36.5 C  SpO2: 93% 94%    Last Pain:  Vitals:   06/10/23 0220  TempSrc:   PainSc: 3                  Adalis Gatti

## 2023-06-21 NOTE — Progress Notes (Signed)
Spoke to patient and instructed them to come at 1015  and to be NPO after 0000. Medications reviewed.   Confirmed that patient will have a ride home and someone to stay with them for 24 hours after the procedure.

## 2023-06-24 ENCOUNTER — Other Ambulatory Visit: Payer: Self-pay

## 2023-06-24 ENCOUNTER — Ambulatory Visit (HOSPITAL_COMMUNITY)
Admission: RE | Admit: 2023-06-24 | Discharge: 2023-06-24 | Disposition: A | Discharge status: Home / Self Care | Payer: BC Managed Care – PPO | Attending: Cardiology | Admitting: Cardiology

## 2023-06-24 ENCOUNTER — Ambulatory Visit (HOSPITAL_COMMUNITY): Payer: BC Managed Care – PPO | Admitting: Anesthesiology

## 2023-06-24 ENCOUNTER — Encounter (HOSPITAL_COMMUNITY): Payer: Self-pay | Admitting: Cardiology

## 2023-06-24 ENCOUNTER — Encounter (HOSPITAL_COMMUNITY)
Admission: RE | Disposition: A | Discharge status: Home / Self Care | Payer: Self-pay | Source: Home / Self Care | Attending: Cardiology

## 2023-06-24 DIAGNOSIS — Z79899 Other long term (current) drug therapy: Secondary | ICD-10-CM | POA: Diagnosis not present

## 2023-06-24 DIAGNOSIS — I4892 Unspecified atrial flutter: Secondary | ICD-10-CM | POA: Insufficient documentation

## 2023-06-24 DIAGNOSIS — I442 Atrioventricular block, complete: Secondary | ICD-10-CM | POA: Diagnosis not present

## 2023-06-24 DIAGNOSIS — I429 Cardiomyopathy, unspecified: Secondary | ICD-10-CM | POA: Insufficient documentation

## 2023-06-24 DIAGNOSIS — Z7901 Long term (current) use of anticoagulants: Secondary | ICD-10-CM | POA: Insufficient documentation

## 2023-06-24 DIAGNOSIS — I5022 Chronic systolic (congestive) heart failure: Secondary | ICD-10-CM | POA: Diagnosis not present

## 2023-06-24 DIAGNOSIS — I493 Ventricular premature depolarization: Secondary | ICD-10-CM | POA: Diagnosis not present

## 2023-06-24 DIAGNOSIS — I69352 Hemiplegia and hemiparesis following cerebral infarction affecting left dominant side: Secondary | ICD-10-CM | POA: Diagnosis not present

## 2023-06-24 HISTORY — PX: CARDIOVERSION: EP1203

## 2023-06-24 SURGERY — CARDIOVERSION (CATH LAB)
Anesthesia: General

## 2023-06-24 MED ORDER — SODIUM CHLORIDE 0.9 % IV SOLN: INTRAVENOUS | Status: DC

## 2023-06-24 MED ORDER — PROPOFOL 10 MG/ML IV BOLUS
INTRAVENOUS | Status: DC | PRN
  Administered 2023-06-24: 100 mg via INTRAVENOUS
  Administered 2023-06-24: 40 mg via INTRAVENOUS

## 2023-06-24 MED ORDER — LIDOCAINE 2% (20 MG/ML) 5 ML SYRINGE
INTRAMUSCULAR | Status: DC | PRN
  Administered 2023-06-24: 100 mg via INTRAVENOUS

## 2023-06-24 SURGICAL SUPPLY — 1 items: PAD DEFIB RADIO PHYSIO CONN (PAD) ×1 IMPLANT

## 2023-06-24 NOTE — Anesthesia Preprocedure Evaluation (Signed)
Anesthesia Evaluation  Patient identified by MRN, date of birth, ID band Patient awake    Reviewed: Allergy & Precautions, NPO status , Patient's Chart, lab work & pertinent test results  History of Anesthesia Complications Negative for: history of anesthetic complications  Airway Mallampati: I  TM Distance: >3 FB Neck ROM: Full    Dental  (+) Teeth Intact, Dental Advisory Given   Pulmonary neg pulmonary ROS, neg shortness of breath, neg sleep apnea, neg COPD, neg recent URI   breath sounds clear to auscultation       Cardiovascular (-) angina +CHF  (-) Past MI + dysrhythmias Atrial Fibrillation + pacemaker + Cardiac Defibrillator  Rhythm:Regular   1. Left ventricular ejection fraction, by estimation, is 40 to 45%. Left  ventricular ejection fraction by PLAX is 40 %. The left ventricle has  mildly decreased function. The left ventricle demonstrates global  hypokinesis. The left ventricular internal  cavity size was mildly dilated. Left ventricular diastolic parameters are  indeterminate. The average left ventricular global longitudinal strain is  -13.2 %.   2. Right ventricular systolic function is normal. The right ventricular  size is mildly enlarged. There is normal pulmonary artery systolic  pressure.   3. Left atrial size was moderately dilated.   4. The mitral valve is normal in structure. No evidence of mitral valve  regurgitation. No evidence of mitral stenosis.   5. Tricuspid valve regurgitation is mild to moderate.   6. The aortic valve is tricuspid. Aortic valve regurgitation is not  visualized. No aortic stenosis is present.   7. The inferior vena cava is normal in size with greater than 50%  respiratory variability, suggesting right atrial pressure of 3 mmHg.     Neuro/Psych neg Seizures CVA, No Residual Symptoms    GI/Hepatic Neg liver ROS,,,Food impaction   Endo/Other  negative endocrine ROS     Renal/GU negative Renal ROSLab Results      Component                Value               Date                      NA                       138                 06/09/2023                K                        4.5                 06/09/2023                CO2                      25                  06/09/2023                GLUCOSE                  107 (H)             06/09/2023  BUN                      18                  06/09/2023                CREATININE               1.08                06/09/2023                CALCIUM                  9.3                 06/09/2023                EGFR                     101                 06/19/2022                GFRNONAA                 >60                 06/09/2023                Musculoskeletal negative musculoskeletal ROS (+)    Abdominal   Peds  Hematology Lab Results      Component                Value               Date                      WBC                      8.3                 06/09/2023                HGB                      14.1                06/09/2023                HCT                      42.6                06/09/2023                MCV                      90.3                06/09/2023                PLT                      157                 06/09/2023  On eliquis   Anesthesia Other Findings   Reproductive/Obstetrics                              Anesthesia Physical Anesthesia Plan  ASA: 3 and emergent  Anesthesia Plan: General   Post-op Pain Management: Minimal or no pain anticipated   Induction: Intravenous  PONV Risk Score and Plan: 2 and TIVA and Treatment may vary due to age or medical condition  Airway Management Planned: Mask and Natural Airway  Additional Equipment: None  Intra-op Plan:   Post-operative Plan:   Informed Consent: I have reviewed the patients History and Physical, chart, labs and discussed the procedure including  the risks, benefits and alternatives for the proposed anesthesia with the patient or authorized representative who has indicated his/her understanding and acceptance.     Dental advisory given  Plan Discussed with: CRNA and Anesthesiologist  Anesthesia Plan Comments:          Anesthesia Quick Evaluation

## 2023-06-24 NOTE — Discharge Instructions (Signed)

## 2023-06-24 NOTE — Interval H&P Note (Signed)
History and Physical Interval Note:  06/24/2023 10:59 AM  Ricardo Jones  has presented today for surgery, with the diagnosis of AFIB.  The various methods of treatment have been discussed with the patient and family. After consideration of risks, benefits and other options for treatment, the patient has consented to  Procedure(s): CARDIOVERSION (N/A) as a surgical intervention.  The patient's history has been reviewed, patient examined, no change in status, stable for surgery.  I have reviewed the patient's chart and labs.  Questions were answered to the patient's satisfaction.     Cloyce Paterson [name]

## 2023-06-24 NOTE — Transfer of Care (Signed)
Immediate Anesthesia Transfer of Care Note  Patient: Ricardo Jones  Procedure(s) Performed: CARDIOVERSION  Patient Location: PACU  Anesthesia Type:General  Level of Consciousness: awake and drowsy  Airway & Oxygen Therapy: Patient Spontanous Breathing and Patient connected to face mask oxygen  Post-op Assessment: Report given to RN and Post -op Vital signs reviewed and stable  Post vital signs: Reviewed and stable  Last Vitals:  Vitals Value Taken Time  BP    Temp    Pulse    Resp    SpO2      Last Pain:  Vitals:   06/24/23 1100  TempSrc:   PainSc: 0-No pain         Complications: No notable events documented.

## 2023-06-24 NOTE — Anesthesia Postprocedure Evaluation (Signed)
Anesthesia Post Note  Patient: Ricardo Jones  Procedure(s) Performed: CARDIOVERSION     Patient location during evaluation: PACU Anesthesia Type: General Level of consciousness: awake and alert Pain management: pain level controlled Vital Signs Assessment: post-procedure vital signs reviewed and stable Respiratory status: spontaneous breathing, nonlabored ventilation, respiratory function stable and patient connected to nasal cannula oxygen Cardiovascular status: blood pressure returned to baseline and stable Postop Assessment: no apparent nausea or vomiting Anesthetic complications: no   No notable events documented.  Last Vitals:  Vitals:   06/24/23 1240 06/24/23 1250  BP: 111/75 106/76  Pulse: 69 67  Resp: 16 13  Temp:    SpO2: 96% 97%    Last Pain:  Vitals:   06/24/23 1233  TempSrc: Temporal  PainSc: 0-No pain                 Trevor Iha

## 2023-06-24 NOTE — CV Procedure (Addendum)
Procedure:   DCCV  Indication:  Symptomatic atrial flutter  Procedure Note:  The patient signed informed consent.  They have had had therapeutic anticoagulation with apixaban greater than 3 weeks.  Anesthesia was administered by Dr. Richardson Landry.  Patient received 100 mg IV lidocaine and 140 mg IV propofol.Adequate airway was maintained throughout and vital followed per protocol.  They were cardioverted x 3 with 200, 300, 360J of biphasic synchronized energy with anterior pressure.  They remained in atrial flutter, confirmed with Medtronic programmer at bedside, with BiV pacing at 70 bpm.  There were no apparent complications.  The patient had normal neuro status and respiratory status post procedure with vitals stable as recorded elsewhere.    Follow up:  They will continue on current medical therapy and follow up with cardiology as scheduled.  Jodelle Red, MD PhD 06/24/2023 12:26 PM

## 2023-06-25 ENCOUNTER — Telehealth: Payer: Self-pay | Admitting: Internal Medicine

## 2023-06-25 NOTE — Telephone Encounter (Signed)
Attempted phone call.  Message left to contact RN at [phone].

## 2023-06-25 NOTE — Telephone Encounter (Signed)
Wife called to say that the patient has had allergic reaction to the pads. Calling to see what need to be done. Please advise

## 2023-06-26 ENCOUNTER — Ambulatory Visit (HOSPITAL_COMMUNITY)
Admission: RE | Admit: 2023-06-26 | Discharge: 2023-06-26 | Disposition: A | Discharge status: Home / Self Care | Payer: BC Managed Care – PPO | Source: Ambulatory Visit | Attending: Cardiology | Admitting: Cardiology

## 2023-06-26 ENCOUNTER — Ambulatory Visit: Payer: BC Managed Care – PPO | Admitting: Gastroenterology

## 2023-06-26 ENCOUNTER — Encounter (HOSPITAL_COMMUNITY): Payer: Self-pay | Admitting: Cardiology

## 2023-06-26 VITALS — BP 126/84 | HR 72 | Wt 222.0 lb

## 2023-06-26 DIAGNOSIS — I484 Atypical atrial flutter: Secondary | ICD-10-CM | POA: Diagnosis not present

## 2023-06-26 DIAGNOSIS — I428 Other cardiomyopathies: Secondary | ICD-10-CM | POA: Diagnosis not present

## 2023-06-26 DIAGNOSIS — Z7901 Long term (current) use of anticoagulants: Secondary | ICD-10-CM | POA: Insufficient documentation

## 2023-06-26 DIAGNOSIS — I442 Atrioventricular block, complete: Secondary | ICD-10-CM | POA: Diagnosis not present

## 2023-06-26 DIAGNOSIS — I472 Ventricular tachycardia, unspecified: Secondary | ICD-10-CM | POA: Insufficient documentation

## 2023-06-26 DIAGNOSIS — I5022 Chronic systolic (congestive) heart failure: Secondary | ICD-10-CM | POA: Insufficient documentation

## 2023-06-26 DIAGNOSIS — I4892 Unspecified atrial flutter: Secondary | ICD-10-CM

## 2023-06-26 DIAGNOSIS — Z8249 Family history of ischemic heart disease and other diseases of the circulatory system: Secondary | ICD-10-CM | POA: Diagnosis not present

## 2023-06-26 DIAGNOSIS — Z95810 Presence of automatic (implantable) cardiac defibrillator: Secondary | ICD-10-CM | POA: Insufficient documentation

## 2023-06-26 DIAGNOSIS — R9431 Abnormal electrocardiogram [ECG] [EKG]: Secondary | ICD-10-CM | POA: Insufficient documentation

## 2023-06-26 DIAGNOSIS — Z8673 Personal history of transient ischemic attack (TIA), and cerebral infarction without residual deficits: Secondary | ICD-10-CM | POA: Diagnosis not present

## 2023-06-26 LAB — BASIC METABOLIC PANEL
Anion gap: 11 (ref 5–15)
BUN: 15 mg/dL (ref 6–20)
CO2: 26 mmol/L (ref 22–32)
Calcium: 9.9 mg/dL (ref 8.9–10.3)
Chloride: 102 mmol/L (ref 98–111)
Creatinine, Ser: 0.96 mg/dL (ref 0.61–1.24)
GFR, Estimated: >60 mL/min (ref >60)
Glucose, Bld: 100 mg/dL — ABNORMAL HIGH (ref 70–99)
Potassium: 4.8 mmol/L (ref 3.5–5.1)
Sodium: 139 mmol/L (ref 135–145)

## 2023-06-26 LAB — MAGNESIUM: Magnesium: 2.2 mg/dL (ref 1.7–2.4)

## 2023-06-26 MED ORDER — BISOPROLOL FUMARATE 10 MG PO TABS: 10.0000 mg | ORAL_TABLET | Freq: Every day | ORAL | 6 refills | Status: DC

## 2023-06-26 NOTE — Patient Instructions (Signed)
Medication Changes:  Increase Bisoprolol to 10 mg Daily at Bedtime  Lab Work:  Labs done today, your results will be available in MyChart, we will contact you for abnormal readings.   Special Instructions // Education:  Do the following things EVERYDAY: Weigh yourself in the morning before breakfast. Write it down and keep it in a log. Take your medicines as prescribed Eat low salt foods--Limit salt (sodium) to 2000 mg per day.  Stay as active as you can everyday Limit all fluids for the day to less than 2 liters   Follow-Up in: 3 months   At the [facility], you and your health needs are our priority. We have a designated team specialized in the treatment of Heart Failure. This Care Team includes your primary Heart Failure Specialized Cardiologist (physician), Advanced Practice Providers (APPs- Physician Assistants and Nurse Practitioners), and Pharmacist who all work together to provide you with the care you need, when you need it.   You may see any of the following providers on your designated Care Team at your next follow up:  Dr. Arvilla Meres Dr. Marca Ancona Dr. Dorthula Nettles Dr. Theresia Bough Tonye Becket, NP Robbie Lis, [place] [facility] [place], [place] Brynda Peon, NP [place] [name], NP Karle Plumber, PharmD   Please be sure to bring in all your medications bottles to every appointment.   Need to Contact us:  If you have any questions or concerns before your next appointment please send [place] a message through [place] or call our office at [phone].    TO LEAVE A MESSAGE FOR THE NURSE SELECT OPTION 2, PLEASE LEAVE A MESSAGE INCLUDING: YOUR NAME DATE OF BIRTH CALL BACK NUMBER REASON FOR CALL**this is important as we prioritize the call backs  YOU WILL RECEIVE A CALL BACK THE SAME DAY AS LONG AS YOU CALL BEFORE 4:00 PM

## 2023-06-26 NOTE — Progress Notes (Signed)
Primary Care: Darrow Bussing, MD HF Cardiologist: Dr. Shirlee Latch EP: Dr. Graciela Husbands  HPI: 52 y.o. male w/ chronic systolic heart failure, CHB and VT s/p Medtronic CRT-D, atrial flutter and CVA.   He has a strong family history of cardiomyopathy.  Uncle with heart transplant, 2 cousins with pacemakers. His mother has had genetic testing and was LMNA gene mutation positive.  He was initially followed by Dr. Johney Frame and Dr. Elease Hashimoto for atrial flutter and bradycardia. Declined atrial flutter ablation in the past. 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 Subsequently was referred for cMRI and PET scan in 2019. cMRI showed non-coronary pattern LGE suspicious for infiltrative disease. PET scan at [facility] showed abnormal FDG uptake consistent with inflamatory process of the myocardium, most especially involving the basal inferior, basal inferoseptal and mid inferoseptal walls. This was also concerning for possible sarcoid; however he had chest CT that showed no pulmonary evidence for sarcoid. Given no definitive diagnosis, pt declined empiric immunosuppressive treatment, but did undergo Medtronic CRT-D placement in 2/20 by Dr. Ladona Ridgel. He has since been followed primarily by Dr. Graciela Husbands.   Echo 01/2014 EF 50-55%, RV normal  Echo 11/2016 EF 40-45%, RV normal  Echo 04/2018 EF 40-45%, RV normal  Echo 02/2019 EF 50-55%, RV normal  Echo 07/2021 showed mildly reduced LVEF, 45-50%, normal RV. Moderate BAE.   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 Unfortunately, had not been on anticoagulation prior to this. He underwent successful mechanical thrombectomy by IR. Patient 's symptoms improved rapidly.  Cardiology was consulted as TTE showed EF of 30-35%, mild RV dilation, severe biatrial enlargement.  He was placed on limited medical therapy to allow for permissive hypertension following stroke. Started  on Coreg 3.15 mg bid. Eliquis was started for atrial flutter. Atorvastatin added (LDL 100 mg/dL). Hgb A1c 6.0. Baseline SCr < 1.0. Discharged home on 09/28/21. Referred to HF clinic.   When I saw him initially, I obtained genetic testing.  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   CPX in 9/23 showed mildly decreased functional capacity.   Patient had EP study for possible atrial flutter ablation in 12/23.  This showed simultaneous left and right atrial flutters.  Due to extensive scar tissue in atria, flutter ablation was not done and patient was cardioverted to NSR.   Echo in 3/24 showed EF 40-45%, mild LV dilation, mild RV enlargement with normal systolic function, IVC normal. Medtronic device interrogation showed about 1 month of atrial flutter vs fibrillation in 7/24.  Patient thinks he had his home device monitor unplugged and he never had a notification that he was out of rhythm.  He did not feel much different in July but says that his ankles were swollen.   Atrial flutter recurred in 11/24.  Unable to pace out and failed DCCV in 12/24.   He returns today for followup of CHF.  He is still in rate-controlled atrial flutter.  He does not feel much different but notes some occasional ankle edema.  Weight up 1 lb. He feels greater heart rate excursion but is not short of breath with his usual activities. He is nervous about heavy exertion while in atrial flutter so has curtailed his activity some.  No chest pain.  No lightheadedness.    ECG (personally reviewed): BiV pacing with underlying atrial flutter.   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  Medtronic CRT-D device.  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  - Echo 01/2014 EF 50-55%, RV normal  - Echo 11/2016 EF 40-45%, RV normal  - Echo 04/2018 EF 40-45%, RV normal  - Cardiac MRI (11/19): LV EF 44%, RV EF 62%, patchy mid-wall LGE in the basal septal and lateral walls.  - CPET (12/19, [facility]): abnormal FDG uptake consistent with inflammatory process of the myocardium. More especially involving the basal-inferior, Basal inferoseptal and mid inferoseptal walls. In the proper clinical settings this may represent active sarcoidosis in the myocardium. - Echo 02/2019 EF 50-55%, RV normal  - Echo 07/2021 showed mildly reduced LVEF, 45-50%, normal RV. Moderate BAE.  - Echo 3/23 EF 30-35%, mild RV dilation, severe biatrial enlargement. - CPX (9/23): Peak VO2 23, VE/VCO2 slope 30, RER 1.1.  Mildly decreased functional capacity.  - Echo (3/24): EF 40-45%, mild LV dilation, mild RV enlargement with normal systolic function, IVC normal 2. 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  3. Atrial flutter: Paroxysmal.  -  EP study for possible atrial flutter ablation in 12/23.  This showed simultaneous left and right atrial flutters.  Due to extensive scar tissue in atria, flutter ablation was not done and patient was cardioverted to NSR.  - Failed DCCV in 12/24 4. 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  5. H/o VT  Review of Systems: All systems reviewed and negative except as per HPI.   Current Outpatient Medications  Medication Sig Dispense Refill   atorvastatin (LIPITOR) 40 MG tablet TAKE 1 TABLET BY MOUTH EVERY DAY 90 tablet 3   ELIQUIS 5 MG TABS tablet TAKE 1 TABLET BY MOUTH TWICE A DAY 180 tablet 3   FARXIGA 10 MG TABS tablet TAKE 1 TABLET BY MOUTH DAILY BEFORE BREAKFAST. 30 tablet 11   fluconazole (DIFLUCAN) 200 MG tablet Take 200 mg by mouth every Tuesday.     spironolactone (ALDACTONE) 25 MG tablet Take 1 tablet (25 mg  total) by mouth daily. 90 tablet 3   valsartan (DIOVAN) 40 MG tablet TAKE 1 TABLET BY MOUTH EVERY DAY 90 tablet 0   bisoprolol (ZEBETA) 10 MG tablet Take 1 tablet (10 mg total) by mouth at bedtime. 30 tablet 6   No current facility-administered medications for this encounter.    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[employer], previously an Technical sales engineer   Social Determinants  of Health   Financial Resource Strain: Not on file  Food Insecurity: Not on file  Transportation Needs: Not on file  Physical Activity: Not on file  Stress: Not on file  Social Connections: Not on file  Intimate Partner Violence: Not on file      Family History  Problem Relation Age of Onset   Asthma Mother    Cancer Maternal Grandmother    Cancer Maternal Grandfather    Cancer Paternal Grandmother    Heart failure Maternal Uncle    Bradycardia Other        multiple family members with pacemakers   Heart failure Maternal Uncle s/p Cardiac Transplant   Maternal Cousin s/p Cardiac Transplant       Vitals:   06/26/23 1025  BP: 126/84  Pulse: 72  SpO2: 99%  Weight: 100.7 kg (222 lb)    PHYSICAL EXAM: General: NAD Neck: No JVD, no thyromegaly or thyroid nodule.  Lungs: Clear to auscultation bilaterally with normal respiratory effort. CV: Nondisplaced PMI.  Heart regular S1/S2, no S3/S4, no murmur.  No peripheral edema.  No carotid bruit.   Normal pedal pulses.  Abdomen: Soft, nontender, no hepatosplenomegaly, no distention.  Skin: Intact without lesions or rashes.  Neurologic: Alert and oriented x 3.  Psych: Normal affect. Extremities: No clubbing or cyanosis.  HEENT: Normal.   ASSESSMENT & PLAN:  1. 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  Patient has had DCM associated with CHB and with atrial flutter.  Strong family history of cardiomyopathy and conduction disease/pacemaker placement.  Patient's mother also has LMNA gene mutation.  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 Patient has MDT CRT-D.  cMRI in 11/19 showed LV EF 44%, RV EF 62%, patchy mid-wall LGE in the basal septal and lateral walls.  cPET in 12/19 showed abnormal uptake in myocardium concerning for inflammatory process.  There was initial concern for cardiac sarcoidosis as a unifying diagnosis, but no sign of pulmonary sarcoidosis on CT chest and patient is positive for LMNA gene mutation.  The cMRI and cPET patterns that the patient had can be seen with genetic cardiomyopathies, often appearing to masquerade as cardiac sarcoidosis.  I think cardiac sarcoidosis is unlikely and would not immunosuppress.  Most recent echo in 3/24 showed EF 40-45%, mild RV dilation. CPX in 9/23 showed mildly decreased functional capacity.  NYHA class I-II, nervous about being in persistent atrial flutter but does not feel markedly different.   He has struggled to tolerate GDMT, SBP generally runs in 90s-100s, but seems to be tolerating current regimen well and BP higher today.  - I will try increasing his bisoprolol to 10 mg qhs.   - Off Entresto due to orthostasis.  Continue valsartan 40 mg qhs.  - Continue spironolactone 25 mg daily. BMET/BNP today.  - Continue Farxiga 10 mg daily.   2. Atrial Flutter: Atypical,  persistent. Dates back to at least 2012.  Flutter ablation attempt in 12/23 with Dr. Ladona Ridgel showed simultaneous left and right atrial flutters.  Due to extensive scar tissue in atria, flutter ablation was not done and patient was cardioverted to NSR. Patient went back into atrial flutter in 11/24 and was unable to be cardioverted in 12/24. Long-term, I would like to keep him in NSR.  - Long-term Eliquis with CVA history.   - He has seen Dr. Graciela Husbands with plan for evaluation by Dr. Marquette Saa  in [place] for possible ablation.  - If ablation is unsuccessful or not offered, I would like him to start an anti-arrhythmic with another attempt at [facility].  Given his age, dofetilide or sotalol would be best options.  3. Rt MCA CVA: Recent MCA infarct due to right M1 occlusion 3/23. Likely secondary to atrial flutter and cardiomyopathy (LVEF 30-35%). Had not been anticoagulated. s/p mechanical thrombectomy by IR - Continue Eliquis 5 mg bid.  - Continue statin.  4. CHB: MDT CRT-D.  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 H/o VT: Has ICD, followed by Dr. Graciela Husbands  - no recent VT on device interrogation   Followup 3 months   Ricardo Jones 06/26/2023

## 2023-06-26 NOTE — Telephone Encounter (Signed)
Attempted phone call to pt and left message to contact RN at [phone]. 

## 2023-06-27 NOTE — Telephone Encounter (Signed)
Attempted phone call to pt.  No answer and voicemail has not been set up.

## 2023-06-27 NOTE — Telephone Encounter (Signed)
Attempted phone call to pt's DPR and left voicemail message to contact office at [phone].

## 2023-07-02 ENCOUNTER — Encounter: Payer: BC Managed Care – PPO | Admitting: Internal Medicine

## 2023-07-02 NOTE — Telephone Encounter (Signed)
Pt was seen by Dr Shirlee Latch on 06/26/2023.

## 2023-07-02 NOTE — Progress Notes (Signed)
Remote ICD transmission.   

## 2023-07-09 ENCOUNTER — Other Ambulatory Visit (HOSPITAL_COMMUNITY): Payer: Self-pay | Admitting: Cardiology

## 2023-07-17 ENCOUNTER — Other Ambulatory Visit (HOSPITAL_COMMUNITY): Payer: Self-pay | Admitting: Cardiology

## 2023-07-18 ENCOUNTER — Ambulatory Visit: Payer: 59 | Admitting: Gastroenterology

## 2023-07-18 ENCOUNTER — Encounter: Payer: Self-pay | Admitting: Gastroenterology

## 2023-07-18 VITALS — BP 128/74 | HR 79 | Ht 79.0 in | Wt 225.6 lb

## 2023-07-18 DIAGNOSIS — K200 Eosinophilic esophagitis: Secondary | ICD-10-CM

## 2023-07-18 DIAGNOSIS — Z1211 Encounter for screening for malignant neoplasm of colon: Secondary | ICD-10-CM

## 2023-07-18 NOTE — Patient Instructions (Addendum)
 We have sent a referral Allergy and asthma they will call you to make an appointment.  Continue Protonix  daily.  _______________________________________________________  If your blood pressure at your visit was 140/90 or greater, please contact your primary care physician to follow up on this.  _______________________________________________________  If you are age 55 or older, your body mass index should be between 23-30. Your Body mass index is 25.41 kg/m. If this is out of the aforementioned range listed, please consider follow up with your Primary Care Provider.  If you are age 8 or younger, your body mass index should be between 19-25. Your Body mass index is 25.41 kg/m. If this is out of the aformentioned range listed, please consider follow up with your Primary Care Provider.   ________________________________________________________  The [facility] providers would like to encourage you to use MYCHART to communicate with providers for non-urgent requests or questions.  Due to long hold times on the telephone, sending your provider a message by [facility] may be a faster and more efficient way to get a response.  Please allow 48 business hours for a response.  Please remember that this is for non-urgent requests.   It was a pleasure to see you today!  Thank you for trusting me with your gastrointestinal care!    Scott E.Stacia, MD

## 2023-07-18 NOTE — Progress Notes (Signed)
 HPI : Ricardo Jones is a 53 y.o. male with a history of genetic cardiomyopathy, a-flutter/history of embolic stroke, also history of peptic stricture and remote food impaction in 2012, who presented to the emergency department Jun 10, 2023 with symptoms of esophageal food impaction.  The impaction was successfully resolved, and his EGD showed a benign-appearing esophageal stenosis as well as mucosal changes suggestive of eosinophilic esophagitis.  Biopsy showed elevated eosinophils in the distal esophagus (20/hpf), but only modestly elevated eosinophils in the proximal esophagus (9/hpf).  He was not on a PPI at the time of his presentation, but was started on twice daily Protonix  following his EGD.  Today, the patient denies any dysphagia.  He admits that he has not bee taking the Protonix , as he didn't feel like it was helpful or necessary.  He has long standing episodes of food getting stuck, often requiring forceful emesis to relieve the obstruction.  He has modified his eating habits by taking small bites and eating very slowly.  As long as he does this, he denies any sensation of food getting stuck.  He denies any chronic GERD symptoms such as frequent heartburn, acid regurgitation, cough/globus sensation, nausea/vomiting.  He reports a history of asthma as a child and recalls undergoing Allergy testing and 'being allergic to everything.'  He has never undergone any colon cancer screening.  He has a Cologuard kit at home that he has not yet submitted.  He denies any chronic lower GI symptoms.  No family history of colon cancer.  He is in persistent atrial flutter, and recently underwent a failed attempt at DCCV last month, currently rate controlled and anticoagulated.  He is being evaluated for possible ablation   EGD Jun 11, 2023 Esophageal Food impaction - Food in the lower third of the esophagus. Removal was successful with Madelyn net.  - Benign-appearing esophageal stenosis.  - Esophageal  mucosal changes suggestive of eosinophilic esophagitis.  - A small amount of food (residue) in the stomach.  - Normal examined duodenum.  - Biopsies were taken with a cold forceps for evaluation of eosinophilic esophagitis  A. ESOPHAGUS, DISTAL, BIOPSY:  Squamous mucosa with reflux changes and increased intraepithelial  eosinophils.  Focally greater than 20 per high-power field.  Skeletal muscle consistent with undigested food material.   B. ESOPHAGUS, PROXIMAL, BIOPSY:  Squamous mucosa with focal intraepithelial eosinophils.  Focally up to 9 per high-power field.    Past Medical History:  Diagnosis Date   Acute right MCA stroke (HCC) 09/25/2021   s/p thrombectomy   Bradycardia    asymptomatic   Chronic systolic CHF (congestive heart failure), NYHA class 2 (HCC)    Complete heart block (HCC)    s/p CRT-D   Hyperlipidemia LDL goal <100    S/P dilatation of esophageal stricture    Typical atrial flutter [facility])      Past Surgical History:  Procedure Laterality Date   A-FLUTTER ABLATION N/A 06/27/2022   Procedure: A-FLUTTER ABLATION;  Surgeon: Waddell Danelle ORN, MD;  Location: [facility] INVASIVE CV LAB;  Service: Cardiovascular;  Laterality: N/A;   BIOPSY  06/10/2023   Procedure: BIOPSY;  Surgeon: Stacia Glendia BRAVO, MD;  Location: [facility] ENDOSCOPY;  Service: Gastroenterology;;   BIV ICD INSERTION CRT-D N/A 09/08/2018   Procedure: BIV ICD INSERTION CRT-D;  Surgeon: Waddell Danelle ORN, MD;  Location: [facility] INVASIVE CV LAB;  Service: Cardiovascular;  Laterality: N/A;   CARDIOVERSION N/A 06/24/2023   Procedure: CARDIOVERSION;  Surgeon: Lonni Slain, MD;  Location:  [facility] INVASIVE CV LAB;  Service: Cardiovascular;  Laterality: N/A;   ESOPHAGOGASTRODUODENOSCOPY N/A 06/10/2023   Procedure: ESOPHAGOGASTRODUODENOSCOPY (EGD);  Surgeon: Stacia Glendia BRAVO, MD;  Location: [facility] ENDOSCOPY;  Service: Gastroenterology;  Laterality: N/A;   FOREIGN BODY REMOVAL  06/10/2023   Procedure: FOREIGN BODY REMOVAL;   Surgeon: Stacia Glendia BRAVO, MD;  Location: [facility] ENDOSCOPY;  Service: Gastroenterology;;   IR CT HEAD LTD  09/25/2021   IR PERCUTANEOUS ART THROMBECTOMY/INFUSION INTRACRANIAL INC DIAG ANGIO  09/25/2021   RADIOLOGY WITH ANESTHESIA N/A 09/25/2021   Procedure: IR WITH ANESTHESIA;  Surgeon: Dolphus Carrion, MD;  Location: [facility] OR;  Service: Radiology;  Laterality: N/A;   THROAT SURGERY  2012   Family History  Problem Relation Age of Onset   Asthma Mother    Cancer Maternal Grandmother    Cancer Maternal Grandfather    Cancer Paternal Grandmother    Heart failure Maternal Uncle    Bradycardia Other        multiple family members with pacemakers   Heart failure Other    Social History   Tobacco Use   Smoking status: Never   Smokeless tobacco: Never  Vaping Use   Vaping status: Never Used  Substance Use Topics   Alcohol use: Yes    Comment: occassionally   Drug use: No   Current Outpatient Medications  Medication Sig Dispense Refill   atorvastatin  (LIPITOR) 40 MG tablet TAKE 1 TABLET BY MOUTH EVERY DAY 90 tablet 3   bisoprolol  (ZEBETA ) 10 MG tablet Take 1 tablet (10 mg total) by mouth at bedtime. 30 tablet 6   ELIQUIS  5 MG TABS tablet TAKE 1 TABLET BY MOUTH TWICE A DAY 180 tablet 3   FARXIGA  10 MG TABS tablet TAKE 1 TABLET BY MOUTH DAILY BEFORE BREAKFAST. 30 tablet 11   fluconazole (DIFLUCAN) 200 MG tablet Take 200 mg by mouth every Tuesday.     spironolactone  (ALDACTONE ) 25 MG tablet Take 1 tablet (25 mg total) by mouth daily. 90 tablet 3   valsartan  (DIOVAN ) 40 MG tablet TAKE 1 TABLET BY MOUTH EVERY DAY 90 tablet 0   No current facility-administered medications for this visit.   No Known Allergies   Review of Systems: All systems reviewed and negative except where noted in HPI.    EP STUDY Result Date: 06/24/2023 See surgical note for result.  CUP PACEART INCLINIC DEVICE CHECK Result Date: 06/19/2023 Device interrogated by teacher, english as a foreign language. See attachment for details. After  initial visit complete, Dr. Fernande attempted to [facility] patient out of AF/AFL episode and was unsuccessful. Patient scheduled for DCCV on 06/24/23.Bard Silvan, BSN, RN   Physical Exam: BP 128/74   Pulse 79   Ht 6' 7 (2.007 m)   Wt 225 lb 9.6 oz (102.3 kg)   SpO2 96%   BMI 25.41 kg/m  Constitutional: Pleasant,well-developed, Caucasian male in no acute distress. Neurological: Alert and oriented to person place and time. Skin: Skin is warm and dry. No rashes noted. Psychiatric: Normal mood and affect. Behavior is normal.  CBC    Component Value Date/Time   WBC 8.3 06/09/2023 2243   RBC 4.72 06/09/2023 2243   HGB 14.1 06/09/2023 2243   HGB 14.6 06/19/2022 1214   HCT 42.6 06/09/2023 2243   HCT 43.0 06/19/2022 1214   PLT 157 06/09/2023 2243   PLT 151 06/19/2022 1214   MCV 90.3 06/09/2023 2243   MCV 88 06/19/2022 1214   MCH 29.9 06/09/2023 2243   MCHC 33.1 06/09/2023 2243   RDW  12.4 06/09/2023 2243   RDW 13.8 06/19/2022 1214   LYMPHSABS 1.5 09/26/2021 0542   MONOABS 0.4 09/26/2021 0542   EOSABS 0.2 09/26/2021 0542   BASOSABS 0.0 09/26/2021 0542    CMP     Component Value Date/Time   NA 139 06/26/2023 1107   NA 137 06/19/2022 1214   K 4.8 06/26/2023 1107   CL 102 06/26/2023 1107   CO2 26 06/26/2023 1107   GLUCOSE 100 (H) 06/26/2023 1107   BUN 15 06/26/2023 1107   BUN 21 06/19/2022 1214   CREATININE 0.96 06/26/2023 1107   CALCIUM  9.9 06/26/2023 1107   PROT 6.6 09/25/2021 1840   ALBUMIN 3.9 09/25/2021 1840   AST 28 09/25/2021 1840   ALT 25 09/25/2021 1840   ALKPHOS 72 09/25/2021 1840   BILITOT 0.9 09/25/2021 1840   GFRNONAA >60 06/26/2023 1107   GFRAA 100 12/15/2018 1041       Latest Ref Rng & Units 06/09/2023   10:43 PM 08/07/2022    4:08 PM 06/19/2022   12:14 PM  CBC EXTENDED  WBC 4.0 - 10.5 K/uL 8.3  5.8  7.6   RBC 4.22 - 5.81 MIL/uL 4.72  4.91  4.89   Hemoglobin 13.0 - 17.0 g/dL 85.8  85.0  85.3   HCT 39.0 - 52.0 % 42.6  43.2  43.0   Platelets 150 - 400  K/uL 157  165  151       ASSESSMENT AND PLAN:  53 year old male with chronic dysphagia, history of impaction in 2012 and again Nov 2024.  He does not have dysphagia symptoms on a regular basis, likely secondary to modification of his eating habits.  Endoscopic findings consistent with EoE, and although proximal eosinophilic burden did not meet criteria for EoE, I still have strong suspicion for EoE based on distal eosinophilia, endoscopic appearance, patient's history and risk factors.  We discussed the pathophysiology of EoE, the potential for progression of the disease to form severe, long segment strictures/narrow caliber esophagus if left untreated, the treatment options to include PPI, swallowed steroids, Dupixent, and elimination diets.  We discussed the role of Allergy testing, but the low yield of positively identifying a dietary trigger through conventional methods (skin testing, IgE levels).  We discussed the most commonly identified triggers (wheat, dairy, soy, tree nuts, fish/shellfish, and eggs). The patient would very much like to avoid medications if at all possible, given a aversion to medications in general, and the fact that his medication list has grown so much due to his cardiac issues.  He was agreeable to taking the Protonix .  We agreed to hold off on any swallowed steroids for now.  He will discuss possible elimination diets with his Allergist based on testing results. We discussed how typically repeat endoscopy is performed 8-12 weeks after instituting a therapy, but given his significant cardiac issues and lack of symptoms, we may elect to defer repeat endoscopic examinations for now. - Start pantoprazole  once daily, 30-45 minutes before breakfast - Refer to allergist for further evaluation and trigger identification - Consider endoscopic reassessment once a-flutter more definitively controlled  Colon cancer screening Discussed importance of colon cancer screening. Has a  Cologuard test at home. - Submit Cologuard test for colon cancer screening - If positive, schedule colonoscopy  Cleophas Yoak E. Stacia, MD [facility]   I spent a total of 35 minutes reviewing the patient's medical record, interviewing and examining the patient, discussing his diagnosis and management of his condition going forward, and  documenting in the medical record   [place], [place]as, MD

## 2023-07-26 ENCOUNTER — Ambulatory Visit: Payer: 59 | Attending: Internal Medicine | Admitting: Internal Medicine

## 2023-07-26 ENCOUNTER — Encounter: Payer: Self-pay | Admitting: Internal Medicine

## 2023-07-26 VITALS — BP 100/66 | HR 71 | Ht 79.0 in | Wt 217.0 lb

## 2023-07-26 DIAGNOSIS — I493 Ventricular premature depolarization: Secondary | ICD-10-CM

## 2023-07-26 DIAGNOSIS — Z95810 Presence of automatic (implantable) cardiac defibrillator: Secondary | ICD-10-CM | POA: Diagnosis not present

## 2023-07-26 DIAGNOSIS — I4892 Unspecified atrial flutter: Secondary | ICD-10-CM | POA: Diagnosis not present

## 2023-07-26 DIAGNOSIS — I442 Atrioventricular block, complete: Secondary | ICD-10-CM | POA: Diagnosis not present

## 2023-07-26 DIAGNOSIS — I428 Other cardiomyopathies: Secondary | ICD-10-CM

## 2023-07-26 NOTE — Patient Instructions (Signed)
 Medication Instructions:  Your physician recommends that you continue on your current medications as directed. Please refer to the Current Medication list given to you today.  *If you need a refill on your cardiac medications before your next appointment, please call your pharmacy*   Lab Work: None ordered.  If you have labs (blood work) drawn today and your tests are completely normal, you will receive your results only by: MyChart Message (if you have MyChart) OR A paper copy in the mail If you have any lab test that is abnormal or we need to change your treatment, we will call you to review the results.   Testing/Procedures: None ordered.    Follow-Up: At [facility], you and your health needs are our priority.  As part of our continuing mission to provide you with exceptional heart care, we have created designated Provider Care Teams.  These Care Teams include your primary Cardiologist (physician) and Advanced Practice Providers (APPs -  Physician Assistants and Nurse Practitioners) who all work together to provide you with the care you need, when you need it.  We recommend signing up for the patient portal called "MyChart".  Sign up information is provided on this After Visit Summary.  MyChart is used to connect with patients for Virtual Visits (Telemedicine).  Patients are able to view lab/test results, encounter notes, upcoming appointments, etc.  Non-urgent messages can be sent to your provider as well.   To learn more about what you can do with MyChart, go to [email or web address].    Your next appointment:   6 months with Dr Graciela Husbands

## 2023-07-26 NOTE — Progress Notes (Signed)
 Okay     Patient Care Team: Regino Slater, MD as PCP - General (Family Medicine) Nahser, Aleene PARAS, MD as PCP - Cardiology (Cardiology) Fernande Elspeth BROCKS, MD as PCP - Electrophysiology (Cardiology) Kelsie Agent, MD (Inactive) as Attending Physician (Cardiology)   HPI  Ricardo Jones is a 53 y.o. 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  High resolution CT scan unrevealing of sarcoid.  Progressive symptomatic bradycardia with his complete heart block received CRT -D 2/20.  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      3/23 presented with left-sided weakness and aphasia, right MCA stroke treated with TNKase  and thrombectomy.  He was discharged on apixaban  and atorvastatin  with a plan to initiate GDMT as an outpatient which has been done with close follow-up with the heart failure clinic.  He is currently on Entresto  bisoprolol  and spironolactone .  Some of fuzziness of head may be associated with lower blood pressure.  Overall sense of weakness from the stroke and he is left-handed and some loss of left hand dexterity   GT-EP lab 12/23 diffuse atrial scarring not mappable flutters.  12/24 presented with atrial flutter and he had concerns that his device could not detected (blanked); he was submitted for cardioversion which failed.  I had also reached out to Dr. pH regarding catheter  ablation  No attributable symptoms    DATE TEST EF    7/15 Echo   50-55 % Severe RAE/LAE  5/18 Echo  40-45 % Mild RAE/LAE  10/19 Echo  40-45%    11/19 cMRI   Severe BAE DGE+>Sarcoid  12/19 cPET   + Consistent Sarcoid  8/20 Echo  50-55%   1/23 Echo  45-50%   3/23 Echo  30-35% Severe BAE  3/24 Echo  40-45%       Date Cr K Hgb  6/20 1.01 4.7 14.3 (2/20)  1/24 0.89 4.9 14.9  12/24 0.96 4.8 14.0     Thromboembolic risk factors (CHF-1) for a CHADSVASc Score of sort of 1      Records and Results Reviewed*   Past Medical History:  Diagnosis Date   Acute right MCA stroke (HCC) 09/25/2021   s/p thrombectomy   Bradycardia    asymptomatic   Chronic systolic CHF (congestive heart failure), NYHA class 2 (HCC)    Complete heart block (HCC)    s/p CRT-D   Hyperlipidemia LDL goal <100    S/P dilatation of esophageal stricture    Typical atrial flutter (HCC)     Past Surgical History:  Procedure Laterality Date   A-FLUTTER ABLATION N/A 06/27/2022   Procedure: A-FLUTTER ABLATION;  Surgeon: Waddell Danelle ORN, MD;  Location: [facility] INVASIVE CV LAB;  Service: Cardiovascular;  Laterality: N/A;   BIOPSY  06/10/2023   Procedure: BIOPSY;  Surgeon: Stacia Glendia BRAVO, MD;  Location: [facility] ENDOSCOPY;  Service: Gastroenterology;;   BIV ICD INSERTION CRT-D N/A 09/08/2018  Procedure: BIV ICD INSERTION CRT-D;  Surgeon: Waddell Danelle ORN, MD;  Location: [facility] INVASIVE CV LAB;  Service: Cardiovascular;  Laterality: N/A;   CARDIOVERSION N/A 06/24/2023   Procedure: CARDIOVERSION;  Surgeon: Lonni Slain, MD;  Location: [facility] INVASIVE CV LAB;  Service: Cardiovascular;  Laterality: N/A;   ESOPHAGOGASTRODUODENOSCOPY N/A 06/10/2023   Procedure: ESOPHAGOGASTRODUODENOSCOPY (EGD);  Surgeon: Stacia Glendia BRAVO, MD;  Location: [facility] ENDOSCOPY;  Service: Gastroenterology;  Laterality: N/A;   FOREIGN BODY REMOVAL  06/10/2023   Procedure: FOREIGN BODY REMOVAL;  Surgeon: Stacia Glendia BRAVO, MD;  Location: [facility]  ENDOSCOPY;  Service: Gastroenterology;;   IR CT HEAD LTD  09/25/2021   IR PERCUTANEOUS ART THROMBECTOMY/INFUSION INTRACRANIAL INC DIAG ANGIO  09/25/2021   RADIOLOGY WITH ANESTHESIA N/A 09/25/2021   Procedure: IR WITH ANESTHESIA;  Surgeon: Dolphus Carrion, MD;  Location: [facility] OR;  Service: Radiology;  Laterality: N/A;   THROAT SURGERY  2012    No outpatient medications have been marked as taking for the 07/26/23 encounter (Office Visit) with Fernande Elspeth BROCKS, MD.    No Known Allergies    Review of Systems negative except from HPI and PMH  Physical Exam BP 100/66   Pulse 71   Ht 6' 7 (2.007 m)   Wt 217 lb (98.4 kg)   SpO2 98%   BMI 24.45 kg/m  Well developed and well nourished in no acute distress HENT normal Neck supple with JVP-flat Clear Device pocket well healed; without hematoma or erythema.  There is no tethering  Regular rate and rhythm, no murmur Abd-soft   No Clubbing cyanosis  edema Skin-warm and dry A & Oriented  Grossly normal sensory and motor function  ECG ventricular pacing with an upright QRS lead V1 negative QRS lead I occasional PVC  Device function is  normal. Programming changes none  See Paceart for details     Assessment and  Plan  Cardiomyopathy-LMN   Complete heart block  Atrial flutter persistent  CRT-D   PVCs   Stroke  Persistent atrial flutter, pace termination failed.  Cardioversion failed.  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 Laurian note, he would anticipate antiarrhythmic therapy either adjunctively or in the event of failure with efforts to maintain sinus rhythm.  Not withstanding symptoms, based on [place] AF-net would agree

## 2023-09-04 ENCOUNTER — Ambulatory Visit (INDEPENDENT_AMBULATORY_CARE_PROVIDER_SITE_OTHER): Payer: Self-pay

## 2023-09-04 DIAGNOSIS — I428 Other cardiomyopathies: Secondary | ICD-10-CM | POA: Diagnosis not present

## 2023-09-04 LAB — CUP PACEART REMOTE DEVICE CHECK
Battery Remaining Longevity: 17 mo
Battery Voltage: 2.91 V
Brady Statistic AP VP Percent: 21.82 %
Brady Statistic AP VS Percent: 0 %
Brady Statistic AS VP Percent: 77.2 %
Brady Statistic AS VS Percent: 0.99 %
Brady Statistic RA Percent Paced: 18.51 %
Brady Statistic RV Percent Paced: 98.33 %
Date Time Interrogation Session: 20250219022726
HighPow Impedance: 79 Ohm
Implantable Lead Connection Status: 753985
Implantable Lead Connection Status: 753985
Implantable Lead Connection Status: 753985
Implantable Lead Implant Date: 20200224
Implantable Lead Implant Date: 20200224
Implantable Lead Implant Date: 20200224
Implantable Lead Location: 753858
Implantable Lead Location: 753859
Implantable Lead Location: 753860
Implantable Lead Model: 5076
Implantable Lead Model: 6935
Implantable Pulse Generator Implant Date: 20200224
Lead Channel Impedance Value: 1026 Ohm
Lead Channel Impedance Value: 256.5 Ohm
Lead Channel Impedance Value: 270 Ohm
Lead Channel Impedance Value: 270 Ohm
Lead Channel Impedance Value: 274.19 Ohm
Lead Channel Impedance Value: 274.19 Ohm
Lead Channel Impedance Value: 304 Ohm
Lead Channel Impedance Value: 380 Ohm
Lead Channel Impedance Value: 437 Ohm
Lead Channel Impedance Value: 513 Ohm
Lead Channel Impedance Value: 513 Ohm
Lead Channel Impedance Value: 570 Ohm
Lead Channel Impedance Value: 589 Ohm
Lead Channel Impedance Value: 893 Ohm
Lead Channel Impedance Value: 969 Ohm
Lead Channel Impedance Value: 969 Ohm
Lead Channel Impedance Value: 969 Ohm
Lead Channel Impedance Value: 969 Ohm
Lead Channel Pacing Threshold Amplitude: 0.75 V
Lead Channel Pacing Threshold Amplitude: 0.875 V
Lead Channel Pacing Threshold Pulse Width: 0.4 ms
Lead Channel Pacing Threshold Pulse Width: 0.4 ms
Lead Channel Sensing Intrinsic Amplitude: 0.75 mV
Lead Channel Sensing Intrinsic Amplitude: 0.75 mV
Lead Channel Sensing Intrinsic Amplitude: 14 mV
Lead Channel Sensing Intrinsic Amplitude: 14 mV
Lead Channel Setting Pacing Amplitude: 1.5 V
Lead Channel Setting Pacing Amplitude: 2 V
Lead Channel Setting Pacing Amplitude: 2.5 V
Lead Channel Setting Pacing Pulse Width: 0.4 ms
Lead Channel Setting Pacing Pulse Width: 0.6 ms
Lead Channel Setting Sensing Sensitivity: 0.3 mV
Zone Setting Status: 755011

## 2023-09-05 ENCOUNTER — Other Ambulatory Visit: Payer: Self-pay | Admitting: Gastroenterology

## 2023-10-01 ENCOUNTER — Encounter: Payer: Self-pay | Admitting: Internal Medicine

## 2023-10-07 ENCOUNTER — Telehealth (HOSPITAL_COMMUNITY): Payer: Self-pay | Admitting: Cardiology

## 2023-10-07 ENCOUNTER — Other Ambulatory Visit (HOSPITAL_COMMUNITY): Payer: Self-pay | Admitting: Cardiology

## 2023-10-07 NOTE — Telephone Encounter (Signed)
 Called to confirm/remind patient of their appointment at the [facility] on 10/07/2023.   Appointment:   [] Confirmed  [] Left mess   [x] No answer/No voice mail  [] Phone not in service  Patient reminded to bring all medications and/or complete list.  Confirmed patient has transportation. Gave directions, instructed to utilize valet parking.

## 2023-10-08 ENCOUNTER — Ambulatory Visit (HOSPITAL_COMMUNITY)
Admission: RE | Admit: 2023-10-08 | Discharge: 2023-10-08 | Disposition: A | Discharge status: Home / Self Care | Payer: BC Managed Care – PPO | Source: Ambulatory Visit | Attending: Cardiology | Admitting: Cardiology

## 2023-10-08 ENCOUNTER — Encounter (HOSPITAL_COMMUNITY): Payer: BC Managed Care – PPO | Admitting: Cardiology

## 2023-10-08 ENCOUNTER — Encounter (HOSPITAL_COMMUNITY): Payer: Self-pay | Admitting: Cardiology

## 2023-10-08 VITALS — BP 100/60 | HR 71 | Wt 225.4 lb

## 2023-10-08 DIAGNOSIS — Z7984 Long term (current) use of oral hypoglycemic drugs: Secondary | ICD-10-CM | POA: Insufficient documentation

## 2023-10-08 DIAGNOSIS — Z8673 Personal history of transient ischemic attack (TIA), and cerebral infarction without residual deficits: Secondary | ICD-10-CM | POA: Insufficient documentation

## 2023-10-08 DIAGNOSIS — I110 Hypertensive heart disease with heart failure: Secondary | ICD-10-CM | POA: Insufficient documentation

## 2023-10-08 DIAGNOSIS — I5022 Chronic systolic (congestive) heart failure: Secondary | ICD-10-CM

## 2023-10-08 DIAGNOSIS — I484 Atypical atrial flutter: Secondary | ICD-10-CM | POA: Insufficient documentation

## 2023-10-08 DIAGNOSIS — I428 Other cardiomyopathies: Secondary | ICD-10-CM | POA: Insufficient documentation

## 2023-10-08 DIAGNOSIS — Z79899 Other long term (current) drug therapy: Secondary | ICD-10-CM | POA: Diagnosis not present

## 2023-10-08 DIAGNOSIS — Z8249 Family history of ischemic heart disease and other diseases of the circulatory system: Secondary | ICD-10-CM | POA: Diagnosis not present

## 2023-10-08 LAB — BASIC METABOLIC PANEL
Anion gap: 3 — ABNORMAL LOW (ref 5–15)
BUN: 21 mg/dL — ABNORMAL HIGH (ref 6–20)
CO2: 30 mmol/L (ref 22–32)
Calcium: 9.3 mg/dL (ref 8.9–10.3)
Chloride: 103 mmol/L (ref 98–111)
Creatinine, Ser: 0.95 mg/dL (ref 0.61–1.24)
GFR, Estimated: >60 mL/min (ref >60)
Glucose, Bld: 99 mg/dL (ref 70–99)
Potassium: 4.7 mmol/L (ref 3.5–5.1)
Sodium: 136 mmol/L (ref 135–145)

## 2023-10-08 LAB — CBC
HCT: 45 % (ref 39.0–52.0)
Hemoglobin: 15 g/dL (ref 13.0–17.0)
MCH: 30.5 pg (ref 26.0–34.0)
MCHC: 33.3 g/dL (ref 30.0–36.0)
MCV: 91.5 fL (ref 80.0–100.0)
Platelets: 158 10*3/uL (ref 150–400)
RBC: 4.92 MIL/uL (ref 4.22–5.81)
RDW: 12.7 % (ref 11.5–15.5)
WBC: 5 10*3/uL (ref 4.0–10.5)
nRBC: 0 % (ref 0.0–0.2)

## 2023-10-08 LAB — BRAIN NATRIURETIC PEPTIDE: B Natriuretic Peptide: 62 pg/mL (ref 0.0–100.0)

## 2023-10-08 MED ORDER — VALSARTAN 40 MG PO TABS: 40.0000 mg | ORAL_TABLET | Freq: Two times a day (BID) | ORAL | 3 refills | Status: DC

## 2023-10-08 NOTE — Patient Instructions (Signed)
 INCREASE Valsartan to 40 mg Twice daily  Labs done today, your results will be available in MyChart, we will contact you for abnormal readings.  Repeat blood work in 10 days.  Your physician has requested that you have an echocardiogram. Echocardiography is a painless test that uses sound waves to create images of your heart. It provides your doctor with information about the size and shape of your heart and how well your heart's chambers and valves are working. This procedure takes approximately one hour. There are no restrictions for this procedure. Please do NOT wear cologne, perfume, aftershave, or lotions (deodorant is allowed). Please arrive 15 minutes prior to your appointment time.  Please note: We ask at that you not bring children with you during ultrasound (echo/ vascular) testing. Due to room size and safety concerns, children are not allowed in the ultrasound rooms during exams. Our front office staff cannot provide observation of children in our lobby area while testing is being conducted. An adult accompanying a patient to their appointment will only be allowed in the ultrasound room at the discretion of the ultrasound technician under special circumstances. We apologize for any inconvenience.  Your physician recommends that you schedule a follow-up appointment in: 4 months ( July) ** PLEASE CALL THE OFFICE IN MAY TO ARRANGE YOUR FOLLOW UP APPOINTMENT.**  If you have any questions or concerns before your next appointment please send [place] a message through [place] or call our office at [phone].    TO LEAVE A MESSAGE FOR THE NURSE SELECT OPTION 2, PLEASE LEAVE A MESSAGE INCLUDING: YOUR NAME DATE OF BIRTH CALL BACK NUMBER REASON FOR CALL**this is important as we prioritize the call backs  YOU WILL RECEIVE A CALL BACK THE SAME DAY AS LONG AS YOU CALL BEFORE 4:00 PM At the [facility], you and your health needs are our priority. As part of our continuing mission  to provide you with exceptional heart care, we have created designated Provider Care Teams. These Care Teams include your primary Cardiologist (physician) and Advanced Practice Providers (APPs- Physician Assistants and Nurse Practitioners) who all work together to provide you with the care you need, when you need it.   You may see any of the following providers on your designated Care Team at your next follow up: Dr Arvilla Meres Dr Marca Ancona Dr. Dorthula Nettles Dr. Clearnce Hasten Amy Filbert Schilder, NP Robbie Lis, [place] [facility] [place], [place] Brynda Peon, NP [place] [name], NP Clarisa Kindred, NP Karle Plumber, PharmD Enos Fling, PharmD   Please be sure to bring in all your medications bottles to every appointment.    Thank you for choosing [facility]

## 2023-10-08 NOTE — Progress Notes (Signed)
 Primary Care: Darrow Bussing, MD HF Cardiologist: Dr. Shirlee Latch EP: Dr. Graciela Husbands  Chief complaint: CHF  HPI: 53 y.o. male w/ chronic systolic heart failure, CHB and VT s/p Medtronic CRT-D, atrial flutter and CVA.   He has a strong family history of cardiomyopathy.  Uncle with heart transplant, 2 cousins with pacemakers. His mother has had genetic testing and was LMNA gene mutation positive.  He was initially followed by Dr. Johney Frame and Dr. Elease Hashimoto for atrial flutter and bradycardia. Declined atrial flutter ablation in the past. 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 Subsequently was referred for cMRI and PET scan in 2019. cMRI showed non-coronary pattern LGE suspicious for infiltrative disease. PET scan at [facility] showed abnormal FDG uptake consistent with inflamatory process of the myocardium, most especially involving the basal inferior, basal inferoseptal and mid inferoseptal walls. This was also concerning for possible sarcoid; however he had chest CT that showed no pulmonary evidence for sarcoid. Given no definitive diagnosis, pt declined empiric immunosuppressive treatment, but did undergo Medtronic CRT-D placement in 2/20 by Dr. Ladona Ridgel. He has since been followed primarily by Dr. Graciela Husbands.   Echo 01/2014 EF 50-55%, RV normal  Echo 11/2016 EF 40-45%, RV normal  Echo 04/2018 EF 40-45%, RV normal  Echo 02/2019 EF 50-55%, RV normal  Echo 07/2021 showed mildly reduced LVEF, 45-50%, normal RV. Moderate BAE.   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 Unfortunately, had not been on anticoagulation prior to this. He underwent successful mechanical thrombectomy by IR. Patient's symptoms improved rapidly.  Cardiology was consulted as TTE showed EF of 30-35%, mild RV dilation, severe biatrial enlargement.  He was placed on limited medical therapy to allow for permissive hypertension  following stroke. Started on Coreg 3.15 mg bid. Eliquis was started for atrial flutter. Atorvastatin added (LDL 100 mg/dL). Hgb A1c 6.0. Baseline SCr < 1.0. Discharged home on 09/28/21. Referred to HF clinic.   When I saw him initially, I obtained genetic testing.  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   CPX in 9/23 showed mildly decreased functional capacity.   Patient had EP study for possible atrial flutter ablation in 12/23.  This showed simultaneous left and right atrial flutters.  Due to extensive scar tissue in atria, flutter ablation was not done and patient was cardioverted to NSR.   Echo in 3/24 showed EF 40-45%, mild LV dilation, mild RV enlargement with normal systolic function, IVC normal. Medtronic device interrogation showed about 1 month of atrial flutter vs fibrillation in 7/24.  Patient thinks he had his home device monitor unplugged and he never had a notification that he was out of rhythm.  He did not feel much different in July but says that his ankles were swollen.   Atrial flutter recurred in 11/24.  Unable to pace out and failed DCCV in 12/24.   He returns today for followup of CHF.  He is still in rate-controlled atrial flutter.  He has occasional ankle swelling.  Mild dyspnea walking up hills but generally does well.  No lightheadedness.  No syncope.  No chest pain.  No orthopnea/PND.  He does not feel palpitations.  Weight is up 3 lbs.   ECG (personally reviewed): Atypical atrial flutter with BiV pacing  MDT device interrogation: 98.5% BiV pacing, 100% atrial flutter, stable thoracic impedance.   Labs (3/23):  LDL 100 Labs (4/23): K 4.4, creatinine 0.99 => 1.04 Labs (6/23): K 4.6, creatinine 1.17, BNP 91 Labs (1/24): K 4.7, creatinine 0.89, BNP 61, hgb 14.9 Labs (11/24): K 4.5, creatinine 1.08 Labs (12/24): K 4.8, creatinine 0.96  PMH: 1. LMNA cardiomyopathy: Associated with low EF and CHB as well as atrial  flutter.  Medtronic CRT-D device.  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  - Echo 01/2014 EF 50-55%, RV normal  - Echo 11/2016 EF 40-45%, RV normal  - Echo 04/2018 EF 40-45%, RV normal  - Cardiac MRI (11/19): LV EF 44%, RV EF 62%, patchy mid-wall LGE in the basal septal and lateral walls.  - CPET (12/19, [facility]): abnormal FDG uptake consistent with inflammatory process of the myocardium. More especially involving the basal-inferior, Basal inferoseptal and mid inferoseptal walls. In the proper clinical settings this may represent active sarcoidosis in the myocardium. - Echo 02/2019 EF 50-55%, RV normal  - Echo 07/2021 showed mildly reduced LVEF, 45-50%, normal RV. Moderate BAE.  - Echo 3/23 EF 30-35%, mild RV dilation, severe biatrial enlargement. - CPX (9/23): Peak VO2 23, VE/VCO2 slope 30, RER 1.1.  Mildly decreased functional capacity.  - Echo (3/24): EF 40-45%, mild LV dilation, mild RV enlargement with normal systolic function, IVC normal 2. 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  3. Atrial flutter: Paroxysmal.  -  EP study for possible atrial flutter ablation in 12/23.  This showed simultaneous left and right atrial flutters.  Due to extensive scar tissue in atria, flutter ablation was not done and patient was cardioverted to NSR.  - Failed DCCV in 12/24 4. 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  5. H/o VT  Review of Systems: All systems reviewed and negative except as per HPI.   Current Outpatient Medications  Medication Sig Dispense Refill   atorvastatin (LIPITOR) 40 MG tablet TAKE 1 TABLET BY MOUTH EVERY DAY 90 tablet 3   bisoprolol (ZEBETA) 10 MG tablet Take 1 tablet (10 mg total) by mouth at bedtime. 30 tablet 6   ELIQUIS 5 MG TABS tablet TAKE 1 TABLET BY MOUTH TWICE A DAY 180 tablet 3   FARXIGA 10 MG TABS tablet TAKE 1 TABLET BY MOUTH DAILY BEFORE BREAKFAST.  30 tablet 11   pantoprazole (PROTONIX) 40 MG tablet TAKE 1 TABLET BY MOUTH TWICE A DAY 180 tablet 1   spironolactone (ALDACTONE) 25 MG tablet Take 1 tablet (25 mg total) by mouth daily. 90 tablet 3   valsartan (DIOVAN) 40 MG tablet Take 1 tablet (40 mg total) by mouth 2 (two) times daily. 180 tablet 3   No current facility-administered medications for this encounter.    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[employer], previously an Technical sales engineer   Social Drivers of Corporate investment banker Strain: Not on file  Food Insecurity: Not on file  Transportation Needs: Not on file  Physical Activity: Not on file  Stress: Not on file  Social Connections: Not on file  Intimate Partner Violence: Not on file      Family History  Problem Relation Age of Onset   Asthma Mother    Cancer Maternal Grandmother    Cancer Maternal Grandfather    Cancer Paternal Grandmother    Heart failure Maternal Uncle    Bradycardia Other        multiple family members with pacemakers   Heart failure Maternal Uncle s/p Cardiac Transplant   Maternal Cousin s/p Cardiac Transplant       Vitals:   10/08/23 0852  BP: 100/60  Pulse: 71  SpO2: 100%  Weight: 102.2 kg (225 lb 6.4 oz)    PHYSICAL EXAM: General: NAD Neck: No JVD, no thyromegaly or thyroid nodule.  Lungs: Clear to auscultation bilaterally with normal respiratory effort. CV:  Nondisplaced PMI.  Heart regular S1/S2, no S3/S4, no murmur.  No peripheral edema.  No carotid bruit.  Normal pedal pulses.  Abdomen: Soft, nontender, no hepatosplenomegaly, no distention.  Skin: Intact without lesions or rashes.  Neurologic: Alert and oriented x 3.  Psych: Normal affect. Extremities: No clubbing or cyanosis.  HEENT: Normal.   ASSESSMENT & PLAN:  1. 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  Patient has had DCM associated with CHB and with atrial flutter.  Strong family history of cardiomyopathy and conduction disease/pacemaker placement.  Patient's mother also has LMNA gene mutation.  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 Patient has MDT CRT-D.  cMRI in 11/19 showed LV EF 44%, RV EF 62%, patchy mid-wall LGE in the basal septal and lateral walls.  cPET in 12/19 showed abnormal uptake in myocardium concerning for inflammatory process.  There was initial concern for cardiac sarcoidosis as a unifying diagnosis, but no sign of pulmonary sarcoidosis on CT chest and patient is positive for LMNA gene mutation.  The cMRI and cPET patterns that the patient had can be seen with genetic cardiomyopathies, often appearing to masquerade as cardiac sarcoidosis.  I think cardiac sarcoidosis is unlikely and would not immunosuppress.  Most recent echo in 3/24 showed EF 40-45%, mild RV dilation. CPX in 9/23 showed mildly decreased functional capacity.  NYHA class II.  He has struggled to tolerate GDMT, SBP generally runs in 90s-100s, but seems to be tolerating current regimen well and BP stable today.  - Continue bisoprolol 10 mg qhs.   - Off Entresto due to orthostasis.  Increase valsartan to 40 mg bid. BMET/BNP today, BMET in 10 days.   - Continue spironolactone 25 mg daily.  - Continue Farxiga 10 mg daily.   2. Atrial Flutter:  Atypical, persistent. Dates back to at least 2012.  Flutter ablation attempt in 12/23 with Dr. Ladona Ridgel showed simultaneous left and right atrial flutters.  Due to extensive scar tissue in atria, flutter ablation was not done and patient was cardioverted to NSR. Patient went back into atrial flutter in 11/24 and was unable to be cardioverted in 12/24. Long-term, I would like to keep him in NSR.  - Long-term Eliquis with CVA history.  CBC today.  - Plan for atrial flutter ablation in [place] with Dr.  Hranitzsky in April.   - If ablation is unsuccessful, I would like him to start an anti-arrhythmic with another attempt at DCCV.  Given his age, dofetilide or sotalol would be best options.  3. Rt MCA CVA: Recent MCA infarct due to right M1 occlusion 3/23. Likely secondary to atrial flutter and cardiomyopathy (LVEF 30-35%). Had not been anticoagulated. s/p mechanical thrombectomy by IR - Continue Eliquis 5 mg bid.  - Continue statin.  4. CHB: MDT CRT-D.  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 H/o VT: Has ICD, followed by Dr. Graciela Husbands  - no recent VT on device interrogation   Followup 3 months   Marca Ancona 10/08/2023

## 2023-10-09 NOTE — Progress Notes (Signed)
 Remote ICD transmission.

## 2023-10-21 ENCOUNTER — Ambulatory Visit (HOSPITAL_COMMUNITY)
Admission: RE | Admit: 2023-10-21 | Discharge: 2023-10-21 | Disposition: A | Discharge status: Home / Self Care | Source: Ambulatory Visit | Attending: Cardiology | Admitting: Cardiology

## 2023-10-21 DIAGNOSIS — I5022 Chronic systolic (congestive) heart failure: Secondary | ICD-10-CM | POA: Insufficient documentation

## 2023-10-21 LAB — BASIC METABOLIC PANEL WITH GFR
Anion gap: 8 (ref 5–15)
BUN: 19 mg/dL (ref 6–20)
CO2: 27 mmol/L (ref 22–32)
Calcium: 9.6 mg/dL (ref 8.9–10.3)
Chloride: 103 mmol/L (ref 98–111)
Creatinine, Ser: 1.06 mg/dL (ref 0.61–1.24)
GFR, Estimated: >60 mL/min (ref >60)
Glucose, Bld: 98 mg/dL (ref 70–99)
Potassium: 4.7 mmol/L (ref 3.5–5.1)
Sodium: 138 mmol/L (ref 135–145)

## 2023-10-24 ENCOUNTER — Other Ambulatory Visit (HOSPITAL_COMMUNITY): Payer: Self-pay

## 2023-10-24 ENCOUNTER — Encounter: Payer: Self-pay | Admitting: Internal Medicine

## 2023-10-24 DIAGNOSIS — I5022 Chronic systolic (congestive) heart failure: Secondary | ICD-10-CM

## 2023-10-24 MED ORDER — EPLERENONE 25 MG PO TABS
25.0000 mg | ORAL_TABLET | Freq: Every day | ORAL | 11 refills | Status: DC
  Filled 2023-10-24: qty 30, 30d supply, fill #0

## 2023-11-05 ENCOUNTER — Other Ambulatory Visit (HOSPITAL_COMMUNITY): Payer: Self-pay | Admitting: Cardiology

## 2023-11-05 ENCOUNTER — Other Ambulatory Visit (HOSPITAL_COMMUNITY): Payer: Self-pay

## 2023-11-05 ENCOUNTER — Other Ambulatory Visit: Payer: Self-pay

## 2023-11-05 MED ORDER — EPLERENONE 25 MG PO TABS: 25.0000 mg | ORAL_TABLET | Freq: Every day | ORAL | 11 refills | Status: DC

## 2023-11-06 ENCOUNTER — Other Ambulatory Visit (HOSPITAL_COMMUNITY): Payer: Self-pay | Admitting: *Deleted

## 2023-11-06 MED ORDER — EPLERENONE 25 MG PO TABS: 25.0000 mg | ORAL_TABLET | Freq: Every day | ORAL | 11 refills | Status: AC

## 2023-11-07 ENCOUNTER — Other Ambulatory Visit (HOSPITAL_COMMUNITY): Payer: Self-pay

## 2023-11-07 ENCOUNTER — Ambulatory Visit (HOSPITAL_COMMUNITY)
Admission: RE | Admit: 2023-11-07 | Discharge: 2023-11-07 | Disposition: A | Discharge status: Home / Self Care | Source: Ambulatory Visit | Attending: Cardiology | Admitting: Cardiology

## 2023-11-07 DIAGNOSIS — I340 Nonrheumatic mitral (valve) insufficiency: Secondary | ICD-10-CM | POA: Diagnosis not present

## 2023-11-07 DIAGNOSIS — Z8673 Personal history of transient ischemic attack (TIA), and cerebral infarction without residual deficits: Secondary | ICD-10-CM | POA: Insufficient documentation

## 2023-11-07 DIAGNOSIS — I5022 Chronic systolic (congestive) heart failure: Secondary | ICD-10-CM

## 2023-11-07 LAB — ECHOCARDIOGRAM COMPLETE
AR max vel: 3.06 cm2
AV Area VTI: 2.76 cm2
AV Area mean vel: 2.84 cm2
AV Mean grad: 3 mmHg
AV Peak grad: 6 mmHg
Ao pk vel: 1.22 m/s
Calc EF: 48.1 %
S' Lateral: 3.8 cm
Single Plane A2C EF: 48.4 %
Single Plane A4C EF: 50.5 %

## 2023-11-07 MED ORDER — FUROSEMIDE 20 MG PO TABS
ORAL_TABLET | ORAL | 3 refills | Status: DC
  Filled 2023-11-07: qty 30, 30d supply, fill #0

## 2023-11-07 NOTE — Progress Notes (Signed)
  Echocardiogram 2D Echocardiogram has been performed.  Asahel Risden D Corley Kohls.RDCS 11/07/2023, 9:58 AM

## 2023-11-07 NOTE — Addendum Note (Signed)
 Addended by: Edris Gowers on: 11/07/2023 11:31 AM   Modules accepted: Orders

## 2023-11-08 ENCOUNTER — Encounter (HOSPITAL_COMMUNITY): Payer: Self-pay

## 2023-11-08 ENCOUNTER — Other Ambulatory Visit (HOSPITAL_COMMUNITY): Payer: Self-pay

## 2023-11-08 ENCOUNTER — Other Ambulatory Visit (HOSPITAL_COMMUNITY): Payer: Self-pay | Admitting: Cardiology

## 2023-11-08 MED ORDER — FUROSEMIDE 20 MG PO TABS: ORAL_TABLET | ORAL | 3 refills | Status: DC

## 2023-11-08 MED ORDER — FUROSEMIDE 20 MG PO TABS: 20.0000 mg | ORAL_TABLET | Freq: Every day | ORAL | 3 refills | Status: DC

## 2023-11-08 NOTE — Addendum Note (Signed)
 Addended by: Edris Gowers on: 11/08/2023 11:56 AM   Modules accepted: Orders

## 2023-11-22 ENCOUNTER — Other Ambulatory Visit (HOSPITAL_COMMUNITY)

## 2023-11-24 ENCOUNTER — Other Ambulatory Visit (HOSPITAL_COMMUNITY): Payer: Self-pay | Admitting: Cardiology

## 2023-11-25 ENCOUNTER — Other Ambulatory Visit (HOSPITAL_COMMUNITY): Payer: Self-pay

## 2023-11-25 ENCOUNTER — Ambulatory Visit (HOSPITAL_COMMUNITY)
Admission: RE | Admit: 2023-11-25 | Discharge: 2023-11-25 | Disposition: A | Discharge status: Home / Self Care | Source: Ambulatory Visit | Attending: Internal Medicine | Admitting: Internal Medicine

## 2023-11-25 DIAGNOSIS — I5022 Chronic systolic (congestive) heart failure: Secondary | ICD-10-CM | POA: Insufficient documentation

## 2023-11-25 LAB — BASIC METABOLIC PANEL WITH GFR
Anion gap: 7 (ref 5–15)
BUN: 20 mg/dL (ref 6–20)
CO2: 27 mmol/L (ref 22–32)
Calcium: 9.2 mg/dL (ref 8.9–10.3)
Chloride: 104 mmol/L (ref 98–111)
Creatinine, Ser: 0.93 mg/dL (ref 0.61–1.24)
GFR, Estimated: >60 mL/min (ref >60)
Glucose, Bld: 96 mg/dL (ref 70–99)
Potassium: 4.6 mmol/L (ref 3.5–5.1)
Sodium: 138 mmol/L (ref 135–145)

## 2023-11-25 MED ORDER — APIXABAN 5 MG PO TABS: 5.0000 mg | ORAL_TABLET | Freq: Two times a day (BID) | ORAL | 3 refills | Status: AC

## 2023-12-04 ENCOUNTER — Ambulatory Visit (INDEPENDENT_AMBULATORY_CARE_PROVIDER_SITE_OTHER): Payer: Self-pay

## 2023-12-04 DIAGNOSIS — I428 Other cardiomyopathies: Secondary | ICD-10-CM

## 2023-12-04 LAB — CUP PACEART REMOTE DEVICE CHECK
Battery Remaining Longevity: 17 mo
Battery Voltage: 2.9 V
Brady Statistic RA Percent Paced: 0.08 %
Brady Statistic RV Percent Paced: 95.91 %
Date Time Interrogation Session: 20250521033526
HighPow Impedance: 70 Ohm
Implantable Lead Connection Status: 753985
Implantable Lead Connection Status: 753985
Implantable Lead Connection Status: 753985
Implantable Lead Implant Date: 20200224
Implantable Lead Implant Date: 20200224
Implantable Lead Implant Date: 20200224
Implantable Lead Location: 753858
Implantable Lead Location: 753859
Implantable Lead Location: 753860
Implantable Lead Model: 5076
Implantable Lead Model: 6935
Implantable Pulse Generator Implant Date: 20200224
Lead Channel Impedance Value: 228 Ohm
Lead Channel Impedance Value: 237.12 Ohm
Lead Channel Impedance Value: 245.538
Lead Channel Impedance Value: 245.538
Lead Channel Impedance Value: 256.148
Lead Channel Impedance Value: 323 Ohm
Lead Channel Impedance Value: 380 Ohm
Lead Channel Impedance Value: 437 Ohm
Lead Channel Impedance Value: 456 Ohm
Lead Channel Impedance Value: 456 Ohm
Lead Channel Impedance Value: 494 Ohm
Lead Channel Impedance Value: 532 Ohm
Lead Channel Impedance Value: 855 Ohm
Lead Channel Impedance Value: 855 Ohm
Lead Channel Impedance Value: 893 Ohm
Lead Channel Impedance Value: 893 Ohm
Lead Channel Impedance Value: 912 Ohm
Lead Channel Impedance Value: 969 Ohm
Lead Channel Pacing Threshold Amplitude: 0.75 V
Lead Channel Pacing Threshold Amplitude: 0.875 V
Lead Channel Pacing Threshold Pulse Width: 0.4 ms
Lead Channel Pacing Threshold Pulse Width: 0.4 ms
Lead Channel Sensing Intrinsic Amplitude: 0.625 mV
Lead Channel Sensing Intrinsic Amplitude: 0.625 mV
Lead Channel Sensing Intrinsic Amplitude: 6.5 mV
Lead Channel Sensing Intrinsic Amplitude: 6.5 mV
Lead Channel Setting Pacing Amplitude: 1.5 V
Lead Channel Setting Pacing Amplitude: 2 V
Lead Channel Setting Pacing Amplitude: 2.5 V
Lead Channel Setting Pacing Pulse Width: 0.4 ms
Lead Channel Setting Pacing Pulse Width: 0.6 ms
Lead Channel Setting Sensing Sensitivity: 0.3 mV
Zone Setting Status: 755011

## 2023-12-09 ENCOUNTER — Other Ambulatory Visit (HOSPITAL_COMMUNITY): Payer: Self-pay | Admitting: Cardiology

## 2023-12-11 ENCOUNTER — Telehealth: Payer: Self-pay | Admitting: Neurology

## 2023-12-11 NOTE — Telephone Encounter (Signed)
 Left a message with the after hour service on 12-11-23 at 12:51 pm   I spoke with patient wife Rubbie Cornwall to follow up on the message that she left with the after service today. she states that she spoke with someone at [facility] where he was seen in the past and was waiting on call back from [facility] neurology.She was told that he may need a new referral to be seen by [facility]. I informed her that I was just following up on our end since we got a Message from the after hour service.   The message from the after hour service states that:  caller states her husband may have had a TIA. At 9:00 am , he had blurred vision for about 10 mins while driving, he had a stroke in March of 2023. He is having trouble speaking and enunciating his words. He also had a ablation on 12-05-23.  The vision passed and he was able to continue driving. Speech is currently is similar as he has since he had a stroke.   After hour service advise patient to see PCP in 3 days and call your doctor during regular hours to make appt.

## 2023-12-11 NOTE — Telephone Encounter (Signed)
 Pt's wife called stating that the pt was experiencing blurred vision to the point that he had to pull over until it went away. Somehow she ended up with [facility] on the phone and they informed her and I quote, "This does not seem like it warrants a ER visit but it is serious and he should call his neurologist so that he can be seen in the next two days. She does not know if this has to do with his past stroke so she would like to be advised if she is needing a referral because it is a new sx or if this has to do with his stroke history.

## 2023-12-12 ENCOUNTER — Ambulatory Visit: Payer: Self-pay | Admitting: Cardiovascular Disease

## 2023-12-12 NOTE — Telephone Encounter (Signed)
 Spoke w/Pt wife regarding phone message and message from [facility]. Wife states she felt Pt may have had TIA and that the vision change is new for the Pt. Discussed Pt recent cardiac procedure and asked if Pt has seen cardiologist or called since issue occurred. Also asked if Pt has seen PCP. Wife stated Pt has not seen and has not called either cardiologist or PCP. Wife states Pt only sees PCP if gets sick. Informed wife Pt needs to reach out to cardiologist in regards to what occurred since the ablation was very recent. Wife insisted Pt have an appt with neurologist anyway as Pt did not have a f/u in May of 2024 as was recommended. Offered first available in Dec 2025, wife accepted. Wife also stated she would reach out to cardiologist in f/u of the symptoms that occurred. Also advised should this occur again Pt should be evaluated in the ED. Wife voiced understanding.

## 2024-01-04 ENCOUNTER — Other Ambulatory Visit (HOSPITAL_COMMUNITY): Payer: Self-pay | Admitting: Cardiology

## 2024-01-23 NOTE — Progress Notes (Signed)
 Remote ICD transmission.

## 2024-02-20 ENCOUNTER — Encounter: Payer: Self-pay | Admitting: Internal Medicine

## 2024-03-04 ENCOUNTER — Ambulatory Visit (INDEPENDENT_AMBULATORY_CARE_PROVIDER_SITE_OTHER): Payer: Self-pay

## 2024-03-04 DIAGNOSIS — I428 Other cardiomyopathies: Secondary | ICD-10-CM

## 2024-03-05 LAB — CUP PACEART REMOTE DEVICE CHECK
Battery Remaining Longevity: 16 mo
Battery Voltage: 2.89 V
Brady Statistic AP VP Percent: 99.92 %
Brady Statistic AP VS Percent: 0.01 %
Brady Statistic AS VP Percent: 0.06 %
Brady Statistic AS VS Percent: 0 %
Brady Statistic RA Percent Paced: 99.87 %
Brady Statistic RV Percent Paced: 98.12 %
Date Time Interrogation Session: 20250820012304
HighPow Impedance: 81 Ohm
Implantable Lead Connection Status: 753985
Implantable Lead Connection Status: 753985
Implantable Lead Connection Status: 753985
Implantable Lead Implant Date: 20200224
Implantable Lead Implant Date: 20200224
Implantable Lead Implant Date: 20200224
Implantable Lead Location: 753858
Implantable Lead Location: 753859
Implantable Lead Location: 753860
Implantable Lead Model: 5076
Implantable Lead Model: 6935
Implantable Pulse Generator Implant Date: 20200224
Lead Channel Impedance Value: 261.164
Lead Channel Impedance Value: 261.164
Lead Channel Impedance Value: 266 Ohm
Lead Channel Impedance Value: 275.172
Lead Channel Impedance Value: 275.172
Lead Channel Impedance Value: 323 Ohm
Lead Channel Impedance Value: 399 Ohm
Lead Channel Impedance Value: 437 Ohm
Lead Channel Impedance Value: 513 Ohm
Lead Channel Impedance Value: 532 Ohm
Lead Channel Impedance Value: 532 Ohm
Lead Channel Impedance Value: 570 Ohm
Lead Channel Impedance Value: 912 Ohm
Lead Channel Impedance Value: 950 Ohm
Lead Channel Impedance Value: 950 Ohm
Lead Channel Impedance Value: 950 Ohm
Lead Channel Impedance Value: 950 Ohm
Lead Channel Impedance Value: 969 Ohm
Lead Channel Pacing Threshold Amplitude: 0.75 V
Lead Channel Pacing Threshold Amplitude: 0.875 V
Lead Channel Pacing Threshold Pulse Width: 0.4 ms
Lead Channel Pacing Threshold Pulse Width: 0.4 ms
Lead Channel Sensing Intrinsic Amplitude: 0.25 mV
Lead Channel Sensing Intrinsic Amplitude: 0.25 mV
Lead Channel Sensing Intrinsic Amplitude: 12.375 mV
Lead Channel Sensing Intrinsic Amplitude: 12.375 mV
Lead Channel Setting Pacing Amplitude: 1.5 V
Lead Channel Setting Pacing Amplitude: 2 V
Lead Channel Setting Pacing Amplitude: 2.5 V
Lead Channel Setting Pacing Pulse Width: 0.4 ms
Lead Channel Setting Pacing Pulse Width: 0.6 ms
Lead Channel Setting Sensing Sensitivity: 0.3 mV
Zone Setting Status: 755011

## 2024-03-09 ENCOUNTER — Ambulatory Visit: Payer: Self-pay | Admitting: Cardiovascular Disease

## 2024-03-12 ENCOUNTER — Encounter: Payer: Self-pay | Admitting: Cardiovascular Disease

## 2024-03-12 ENCOUNTER — Ambulatory Visit: Payer: Self-pay | Attending: Cardiology | Admitting: Cardiovascular Disease

## 2024-03-12 VITALS — BP 96/64 | HR 85 | Ht 79.0 in | Wt 228.0 lb

## 2024-03-12 DIAGNOSIS — I4892 Unspecified atrial flutter: Secondary | ICD-10-CM

## 2024-03-12 DIAGNOSIS — Z95810 Presence of automatic (implantable) cardiac defibrillator: Secondary | ICD-10-CM

## 2024-03-12 DIAGNOSIS — I442 Atrioventricular block, complete: Secondary | ICD-10-CM

## 2024-03-12 DIAGNOSIS — I493 Ventricular premature depolarization: Secondary | ICD-10-CM | POA: Diagnosis not present

## 2024-03-12 NOTE — Progress Notes (Signed)
 Electrophysiology Office Note:    Date:  03/12/2024   ID:  Ricardo Jones, DOB 25-Aug-1970, MRN [id number]  PCP:  Regino Slater, MD   [facility] Providers Cardiologist:  Aleene Passe, MD (Inactive) Electrophysiologist:  Elspeth Sage, MD     Referring MD: Regino Slater, MD   History of Present Illness:    Ricardo Jones is a 53 y.o. male with a medical history significant for persistent atrial fibrillation and flutter, complete heart block, ventricular tachycardia, Medtronic CRT-D, CHFrEF referred for device and arrhythmia management.     He was initially followed by Dr. Kelsie and Dr. Passe for atrial flutter and bradycardia.  The flutter diagnosis dates back to at least 2012 he developed complete heart block and chronic concern for possible cardiac sarcoidosis.  A cMRI showed a noncoronary pattern of LGE suspicious for an infiltrative disease.  PET scan at [facility] showed abnormal FDG uptake consistent with inflammatory process of the myocardium concerning for possible sarcoid, yet the CT did not show evidence of pulmonary sarcoid.  He suffered a right MCA infarct in March 2023 attributed to atrial flutter for which she had not been on anticoagulation.  At that time EF was 30 to 35%, and he was noted to have severe biatrial enlargement.  Eliquis  was started.  Subsequent genetic testing was obtained: heterozygote+ for LMNA mutation and heterozygote + for ALPK3 gene mutation.  EP study by Dr. Waddell in December 2023 showed left and right atrial flutters.  Ablation was not performed and the patient was cardioverted to sinus rhythm.  Flutter recurred in 2024. It appears that the flutter was not detected by his device due to blanking.  Attempts to pace terminate and cardiovert in December 2024 failed.  Patient went an ablation for atypical atrial flutter by Dr. Sharron in on Dec 05, 2023.  The flutter cycle length was 220 ms, and mapping revealed the circuit around scar in the  right lateral atrium.  FARAPULSE was used to perform ablation in this region resulting in conversion of the original atrial flutter to a second flutter with a cycle length of 520 ms.  Prior to termination of the case, a complete caval to caval ablation was performed.  History of Present Illness  He was initially followed by Dr. Kelsie and Dr. Passe for atrial flutter and bradycardia.  The flutter diagnosis dates back to at least 2012 he developed complete heart block and chronic concern for possible cardiac sarcoidosis.  A cMRI showed a noncoronary pattern of LGE suspicious for an infiltrative disease.  PET scan at [facility] showed abnormal FDG uptake consistent with inflammatory process of the myocardium concerning for possible sarcoid, yet the CT did not show evidence of pulmonary sarcoid.  He suffered a right MCA infarct in March 2023 attributed to atrial flutter for which she had not been on anticoagulation.  At that time EF was 30 to 35%, and he was noted to have severe biatrial enlargement.  Eliquis  was started.  Subsequent genetic testing was obtained: heterozygote+ for LMNA mutation and heterozygote + for ALPK3 gene mutation.  EP study by Dr. Waddell in December 2023 showed left and right atrial flutters.  Ablation was not performed and the patient was cardioverted to sinus rhythm.  Flutter recurred in 2024. It appears that the flutter was not detected by his device due to blanking.  Attempts to pace terminate and cardiovert in December 2024 failed.  Patient went an ablation for atypical atrial flutter by Dr. Sharron in on  Dec 05, 2023.  The flutter cycle length was 220 ms, and mapping revealed the circuit around scar in the right lateral atrium.  FARAPULSE was used to perform ablation in this region resulting in conversion of the original atrial flutter to a second flutter with a cycle length of 520 ms.  Prior to termination of the case, a complete caval to caval ablation was performed.         Today, he has no device related complaints -- no new tenderness, drainage, redness.   EKGs/Labs/Other Studies Reviewed Today:     Echocardiogram:  TTE November 07, 2023 LVEF 40 to 45%.  Mild concentric LVH.  RV systolic function is mildly reduced.  Left atrium moderately dilated, right atrium mildly dilated.    EKG:   EKG Interpretation Date/Time:  Thursday March 12 2024 10:33:26 EDT Ventricular Rate:  85 PR Interval:  108 QRS Duration:  168 QT Interval:  426 QTC Calculation: 506 R Axis:   263  Text Interpretation: AV dual-paced rhythm Biventricular pacemaker detected When compared with ECG of 08-Oct-2023 08:53, Atrial paced rhythm has replaced atrial flutter Vent. rate has increased BY   9 BPM Confirmed by Ricardo Scotts [phone]) on 03/12/2024 11:21:21 AM     Physical Exam:    VS:  BP 96/64 (BP Location: Right Arm, Patient Position: Sitting, Cuff Size: Large)   Pulse 85   Ht 6' 7 (2.007 m)   Wt 228 lb (103.4 kg)   SpO2 97%   BMI 25.69 kg/m     Wt Readings from Last 3 Encounters:  03/12/24 228 lb (103.4 kg)  10/08/23 225 lb 6.4 oz (102.2 kg)  07/26/23 217 lb (98.4 kg)     GEN: Well nourished, well developed in no acute distress CARDIAC: RRR, no murmurs, rubs, gallops RESPIRATORY:  Normal work of breathing MUSCULOSKELETAL: no edema    ASSESSMENT & PLAN:     Medtronic CRT D I reviewed today's device interrogation in detail.  See Paceart for report Device is functioning normally He is pacemaker dependent today  History of VT No recent events Patient does not recall ever receiving a shock  History of atrial fibrillation, atypical atrial flutter High burden of atrial scar Severe biatrial enlargement Status post ablation (IVC-SVC) by Dr. Kandee 11/2023 Continue apixaban  5 mg p.o. twice daily  Complete heart block Medtronic device is functioning normally On Eliquis   Right MCA CVA -March 2023 S/p mechanical thrombectomy  CHFrEF Function  partially recovered --LVEF 40 to 45% in April 2025 Continue bisoprolol  10 mg, eplerenone  25 mg, Farxiga  10 mg, valsartan  40 mg  Gene abnormalities: LMNA, ALPK3   This was my first time meeting this complicated patient.  I reviewed notes by Drs. McClain and Klein, reviewed tests and studies mentioned above.  I personally reviewed and interpreted the device interrogations interrogations from this calendar year.  I spent 45 minutes on this visit today.  Signed, Scotts FORBES Nancey, MD  03/12/2024 9:12 PM    [facility]

## 2024-03-12 NOTE — Patient Instructions (Signed)
 Medication Instructions:  Your physician recommends that you continue on your current medications as directed. Please refer to the Current Medication list given to you today.  *If you need a refill on your cardiac medications before your next appointment, please call your pharmacy*  Lab Work: None ordered.  If you have labs (blood work) drawn today and your tests are completely normal, you will receive your results only by: MyChart Message (if you have MyChart) OR A paper copy in the mail If you have any lab test that is abnormal or we need to change your treatment, we will call you to review the results.  Testing/Procedures: None ordered.    Follow-Up: At [facility], you and your health needs are our priority.  As part of our continuing mission to provide you with exceptional heart care, our providers are all part of one team.  This team includes your primary Cardiologist (physician) and Advanced Practice Providers or APPs (Physician Assistants and Nurse Practitioners) who all work together to provide you with the care you need, when you need it.  Your next appointment:   6 months with Dr Marko APP  12 months with Dr Nancey

## 2024-04-08 NOTE — Progress Notes (Signed)
Remote ICD Transmission.

## 2024-05-18 ENCOUNTER — Telehealth: Payer: Self-pay | Admitting: Neurology

## 2024-05-18 ENCOUNTER — Encounter: Payer: Self-pay | Admitting: Neurology

## 2024-05-18 NOTE — Telephone Encounter (Signed)
 VM box full, sent mychart msg and letter in mail informing pt of need to reschedule 07/06/24 appt - MD out

## 2024-06-03 ENCOUNTER — Ambulatory Visit (INDEPENDENT_AMBULATORY_CARE_PROVIDER_SITE_OTHER): Payer: Self-pay

## 2024-06-03 DIAGNOSIS — I493 Ventricular premature depolarization: Secondary | ICD-10-CM

## 2024-06-04 LAB — CUP PACEART REMOTE DEVICE CHECK
Battery Remaining Longevity: 14 mo
Battery Voltage: 2.88 V
Brady Statistic AP VP Percent: 99.96 %
Brady Statistic AP VS Percent: 0.01 %
Brady Statistic AS VP Percent: 0.03 %
Brady Statistic AS VS Percent: 0 %
Brady Statistic RA Percent Paced: 99.97 %
Brady Statistic RV Percent Paced: 97.26 %
Date Time Interrogation Session: 20251119001703
HighPow Impedance: 72 Ohm
Implantable Lead Connection Status: 753985
Implantable Lead Connection Status: 753985
Implantable Lead Connection Status: 753985
Implantable Lead Implant Date: 20200224
Implantable Lead Implant Date: 20200224
Implantable Lead Implant Date: 20200224
Implantable Lead Location: 753858
Implantable Lead Location: 753859
Implantable Lead Location: 753860
Implantable Lead Model: 5076
Implantable Lead Model: 6935
Implantable Pulse Generator Implant Date: 20200224
Lead Channel Impedance Value: 256.5 Ohm
Lead Channel Impedance Value: 261.164
Lead Channel Impedance Value: 261.164
Lead Channel Impedance Value: 274.19 Ohm
Lead Channel Impedance Value: 279.525
Lead Channel Impedance Value: 304 Ohm
Lead Channel Impedance Value: 399 Ohm
Lead Channel Impedance Value: 437 Ohm
Lead Channel Impedance Value: 513 Ohm
Lead Channel Impedance Value: 513 Ohm
Lead Channel Impedance Value: 532 Ohm
Lead Channel Impedance Value: 589 Ohm
Lead Channel Impedance Value: 912 Ohm
Lead Channel Impedance Value: 912 Ohm
Lead Channel Impedance Value: 950 Ohm
Lead Channel Impedance Value: 950 Ohm
Lead Channel Impedance Value: 950 Ohm
Lead Channel Impedance Value: 969 Ohm
Lead Channel Pacing Threshold Amplitude: 0.875 V
Lead Channel Pacing Threshold Amplitude: 1 V
Lead Channel Pacing Threshold Pulse Width: 0.4 ms
Lead Channel Pacing Threshold Pulse Width: 0.4 ms
Lead Channel Sensing Intrinsic Amplitude: 0.5 mV
Lead Channel Sensing Intrinsic Amplitude: 0.5 mV
Lead Channel Sensing Intrinsic Amplitude: 12.375 mV
Lead Channel Sensing Intrinsic Amplitude: 12.375 mV
Lead Channel Setting Pacing Amplitude: 1.5 V
Lead Channel Setting Pacing Amplitude: 2 V
Lead Channel Setting Pacing Amplitude: 2.5 V
Lead Channel Setting Pacing Pulse Width: 0.4 ms
Lead Channel Setting Pacing Pulse Width: 0.6 ms
Lead Channel Setting Sensing Sensitivity: 0.3 mV
Zone Setting Status: 755011

## 2024-06-05 NOTE — Progress Notes (Signed)
 Remote ICD Transmission

## 2024-06-10 ENCOUNTER — Telehealth (HOSPITAL_COMMUNITY): Payer: Self-pay | Admitting: Cardiology

## 2024-06-10 NOTE — Telephone Encounter (Signed)
 Called to confirm/remind patient of their appointment at [facility] on 06/10/2024.   Appointment:   [] Confirmed  [] Left mess   [] No answer/No voice mail  [x] VM Full/unable to leave message  [] Phone not in service  Patient reminded to bring all medications and/or complete list.  Confirmed patient has transportation. Gave directions, instructed to utilize valet parking.

## 2024-06-15 ENCOUNTER — Telehealth (HOSPITAL_COMMUNITY): Payer: Self-pay

## 2024-06-15 ENCOUNTER — Ambulatory Visit (HOSPITAL_COMMUNITY): Payer: Self-pay | Admitting: Cardiology

## 2024-06-15 ENCOUNTER — Ambulatory Visit (HOSPITAL_COMMUNITY)
Admission: RE | Admit: 2024-06-15 | Discharge: 2024-06-15 | Disposition: A | Source: Ambulatory Visit | Attending: Cardiology | Admitting: Cardiology

## 2024-06-15 ENCOUNTER — Ambulatory Visit: Payer: Self-pay | Admitting: Cardiovascular Disease

## 2024-06-15 ENCOUNTER — Other Ambulatory Visit (HOSPITAL_COMMUNITY): Payer: Self-pay

## 2024-06-15 ENCOUNTER — Encounter (HOSPITAL_COMMUNITY): Payer: Self-pay | Admitting: Cardiology

## 2024-06-15 VITALS — BP 100/60 | HR 74

## 2024-06-15 DIAGNOSIS — I5022 Chronic systolic (congestive) heart failure: Secondary | ICD-10-CM | POA: Diagnosis present

## 2024-06-15 DIAGNOSIS — E785 Hyperlipidemia, unspecified: Secondary | ICD-10-CM | POA: Diagnosis not present

## 2024-06-15 DIAGNOSIS — Z79899 Other long term (current) drug therapy: Secondary | ICD-10-CM | POA: Diagnosis not present

## 2024-06-15 DIAGNOSIS — I442 Atrioventricular block, complete: Secondary | ICD-10-CM | POA: Diagnosis not present

## 2024-06-15 DIAGNOSIS — Z7901 Long term (current) use of anticoagulants: Secondary | ICD-10-CM | POA: Diagnosis not present

## 2024-06-15 DIAGNOSIS — I428 Other cardiomyopathies: Secondary | ICD-10-CM | POA: Insufficient documentation

## 2024-06-15 DIAGNOSIS — I484 Atypical atrial flutter: Secondary | ICD-10-CM | POA: Diagnosis not present

## 2024-06-15 DIAGNOSIS — Z7984 Long term (current) use of oral hypoglycemic drugs: Secondary | ICD-10-CM | POA: Insufficient documentation

## 2024-06-15 DIAGNOSIS — E8770 Fluid overload, unspecified: Secondary | ICD-10-CM | POA: Diagnosis not present

## 2024-06-15 DIAGNOSIS — Z8673 Personal history of transient ischemic attack (TIA), and cerebral infarction without residual deficits: Secondary | ICD-10-CM | POA: Diagnosis not present

## 2024-06-15 DIAGNOSIS — Z95810 Presence of automatic (implantable) cardiac defibrillator: Secondary | ICD-10-CM | POA: Insufficient documentation

## 2024-06-15 LAB — BASIC METABOLIC PANEL WITH GFR
Anion gap: 9 (ref 5–15)
BUN: 23 mg/dL — ABNORMAL HIGH (ref 6–20)
CO2: 28 mmol/L (ref 22–32)
Calcium: 9.4 mg/dL (ref 8.9–10.3)
Chloride: 101 mmol/L (ref 98–111)
Creatinine, Ser: 1.03 mg/dL (ref 0.61–1.24)
GFR, Estimated: >60 mL/min (ref >60)
Glucose, Bld: 94 mg/dL (ref 70–99)
Potassium: 4.4 mmol/L (ref 3.5–5.1)
Sodium: 138 mmol/L (ref 135–145)

## 2024-06-15 LAB — LIPID PANEL
Cholesterol: 116 mg/dL (ref 0–200)
HDL: 51 mg/dL (ref >40)
LDL Cholesterol: 53 mg/dL (ref 0–99)
Total CHOL/HDL Ratio: 2.3 ratio
Triglycerides: 58 mg/dL (ref <150)
VLDL: 12 mg/dL (ref 0–40)

## 2024-06-15 LAB — BRAIN NATRIURETIC PEPTIDE: B Natriuretic Peptide: 58.1 pg/mL (ref 0.0–100.0)

## 2024-06-15 MED ORDER — SACUBITRIL-VALSARTAN 24-26 MG PO TABS: 1.0000 | ORAL_TABLET | Freq: Two times a day (BID) | ORAL | 3 refills | Status: AC

## 2024-06-15 NOTE — Patient Instructions (Signed)
 Medication Changes:  STOP Valsartan   START Entresto  24/26 mg Twice daily   Lab Work:  Labs done today, your results will be available in MyChart, we will contact you for abnormal readings.  Your physician recommends that you return for lab work in: 1-2 weeks  Special Instructions // Education:  Do the following things EVERYDAY: Weigh yourself in the morning before breakfast. Write it down and keep it in a log. Take your medicines as prescribed Eat low salt foods--Limit salt (sodium) to 2000 mg per day.  Stay as active as you can everyday Limit all fluids for the day to less than 2 liters   Follow-Up in: 3 months   At the [facility], you and your health needs are our priority. We have a designated team specialized in the treatment of Heart Failure. This Care Team includes your primary Heart Failure Specialized Cardiologist (physician), Advanced Practice Providers (APPs- Physician Assistants and Nurse Practitioners), and Pharmacist who all work together to provide you with the care you need, when you need it.   You may see any of the following providers on your designated Care Team at your next follow up:  Dr. Toribio Fuel Dr. Ezra Shuck Dr. Odis Brownie Greig Mosses, NP Caffie Shed, [place] [facility] [place], [place] Beckey Coe, NP Jordan [name], NP Tinnie Redman, PharmD   Please be sure to bring in all your medications bottles to every appointment.   Need to Contact Us :  If you have any questions or concerns before your next appointment please send us  a message through [place] or call our office at [phone].    TO LEAVE A MESSAGE FOR THE NURSE SELECT OPTION 2, PLEASE LEAVE A MESSAGE INCLUDING: YOUR NAME DATE OF BIRTH CALL BACK NUMBER REASON FOR CALL**this is important as we prioritize the call backs  YOU WILL RECEIVE A CALL BACK THE SAME DAY AS LONG AS YOU CALL BEFORE 4:00 PM

## 2024-06-15 NOTE — Telephone Encounter (Signed)
 Advanced Heart Failure Patient Advocate Encounter  Test billing for this patient's current coverage ([employer]) returns a $30 copay for 90 day supply of Sacubitril-Valsartan .  This test claim was processed through [facility]- copay amounts may vary at other pharmacies due to pharmacy/plan contracts, or as the patient moves through the different stages of their insurance plan.  Rachel DEL, CPhT Rx Patient Advocate Phone: [phone]

## 2024-06-15 NOTE — Progress Notes (Signed)
 Primary Care: Regino Slater, MD HF Cardiologist: Dr. Rolan EP: Dr. Nancey  Chief complaint: CHF  HPI: 53 y.o. male w/ chronic systolic heart failure, CHB and VT s/p Medtronic CRT-D, atrial flutter and CVA.   He has a strong family history of cardiomyopathy.  Uncle with heart transplant, 2 cousins with pacemakers. His mother has had genetic testing and was LMNA gene mutation positive.  He was initially followed by Dr. Kelsie and Dr. Alveta for atrial flutter and bradycardia. Declined atrial flutter ablation in the past. 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 Subsequently was referred for cMRI and PET scan in 2019. cMRI showed non-coronary pattern LGE suspicious for infiltrative disease. PET scan at [facility] showed abnormal FDG uptake consistent with inflamatory process of the myocardium, most especially involving the basal inferior, basal inferoseptal and mid inferoseptal walls. This was also concerning for possible sarcoid; however he had chest CT that showed no pulmonary evidence for sarcoid. Given no definitive diagnosis, pt declined empiric immunosuppressive treatment, but did undergo Medtronic CRT-D placement in 2/20 by Dr. Waddell. He has since been followed primarily by Dr. Fernande.   Echo 01/2014 EF 50-55%, RV normal  Echo 11/2016 EF 40-45%, RV normal  Echo 04/2018 EF 40-45%, RV normal  Echo 02/2019 EF 50-55%, RV normal  Echo 07/2021 showed mildly reduced LVEF, 45-50%, normal RV. Moderate BAE.   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 Unfortunately, had not been on anticoagulation prior to this. He underwent successful mechanical thrombectomy by IR. Patient's symptoms improved rapidly.  Cardiology was consulted as TTE showed EF of 30-35%, mild RV dilation, severe biatrial enlargement.  He was placed on limited medical therapy to allow for permissive hypertension  following stroke. Started on Coreg  3.15 mg bid. Eliquis  was started for atrial flutter. Atorvastatin  added (LDL 100 mg/dL). Hgb A1c 6.0. Baseline SCr < 1.0. Discharged home on 09/28/21. Referred to HF clinic.   When I saw him initially, I obtained genetic testing.  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   CPX in 9/23 showed mildly decreased functional capacity.   Patient had EP study for possible atrial flutter ablation in 12/23.  This showed simultaneous left and right atrial flutters.  Due to extensive scar tissue in atria, flutter ablation was not done and patient was cardioverted to NSR.   Echo in 3/24 showed EF 40-45%, mild LV dilation, mild RV enlargement with normal systolic function, IVC normal. Medtronic device interrogation showed about 1 month of atrial flutter vs fibrillation in 7/24.  Patient thinks he had his home device monitor unplugged and he never had a notification that he was out of rhythm.  He did not feel much different in July but says that his ankles were swollen.   Atrial flutter recurred in 11/24.  Unable to pace out and failed DCCV in 12/24.  Echo in 4/25 showed EF 40-45%, mild LVH, mild RV dysfunction, PASP 38 mmHg, mild MR, dilated IVC.  He had ablation of atypical atrial flutter in 5/25 by Dr. Sherrye.    He returns today for followup of CHF.  He has been out of AFL since 5/25 but does not feel any different.  He has had chronic lower extremity edema.  He is tired walking up hills or walking long distances, not severely short of breath but legs give out.  This seems new to him over the last year.  He continues to exercise regularly (elliptical, lifting weights).  No orthopnea/PND.    ECG (personally reviewed): A-paced with BiV pacing  MDT device interrogation: 96.8% BiV pacing, a-paced with no AFL/AF/VT, stable thoracic impedance.   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  Medtronic CRT-D device.  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  - Echo 01/2014 EF 50-55%, RV normal  - Echo 11/2016 EF 40-45%, RV normal  - Echo 04/2018 EF 40-45%, RV normal  - Cardiac MRI (11/19): LV EF 44%, RV EF 62%, patchy mid-wall LGE in the basal septal and lateral walls.  - CPET (12/19, [facility]): abnormal FDG uptake consistent with inflammatory process of the myocardium. More especially involving the basal-inferior, Basal inferoseptal and mid inferoseptal walls. In the proper clinical settings this may represent active sarcoidosis in the myocardium. - Echo 02/2019 EF 50-55%, RV normal  - Echo 07/2021 showed mildly reduced LVEF, 45-50%, normal RV. Moderate BAE.  - Echo 3/23 EF 30-35%, mild RV dilation, severe biatrial enlargement. - CPX (9/23): Peak VO2 23, VE/VCO2 slope 30, RER 1.1.  Mildly decreased functional capacity.  - Echo (3/24): EF 40-45%, mild LV dilation, mild RV enlargement with normal systolic function, IVC normal - Echo (4/25): EF 40-45%, mild LVH, mild RV dysfunction, PASP 38 mmHg, mild MR, dilated IVC.  2. 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  3. Atrial flutter: Paroxysmal.  -  EP study for possible atrial flutter ablation in 12/23.  This showed simultaneous left and right atrial flutters.  Due to extensive scar tissue in atria, flutter ablation was not done and patient was cardioverted to NSR.  - Failed DCCV in 12/24 - Ablation by Dr. Sherrye in 5/25 4. 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  5. H/o VT  Review of Systems: All systems reviewed and negative except as per HPI.   Current Outpatient Medications  Medication Sig Dispense Refill   apixaban   (ELIQUIS ) 5 MG TABS tablet Take 1 tablet (5 mg total) by mouth 2 (two) times daily. 180 tablet 3   atorvastatin  (LIPITOR) 40 MG tablet TAKE 1 TABLET BY MOUTH EVERY DAY 90 tablet 3   bisoprolol  (ZEBETA ) 10 MG tablet TAKE 1 TABLET BY MOUTH EVERYDAY AT BEDTIME 90 tablet 2   eplerenone  (INSPRA ) 25 MG tablet Take 1 tablet (25 mg total) by mouth daily. Start on 11/08/23 30 tablet 11   FARXIGA  10 MG TABS tablet TAKE 1 TABLET BY MOUTH EVERY DAY BEFORE BREAKFAST 90 tablet 3   furosemide  (LASIX ) 20 MG tablet Take 1 tablet (20 mg total) by mouth daily. 30 tablet 3   sacubitril-valsartan  (ENTRESTO ) 24-26 MG Take 1 tablet by mouth 2 (two) times daily. 180 tablet 3   No current facility-administered medications for this encounter.    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[employer], previously an technical sales engineer   Social Drivers of Corporate Investment Banker Strain: Not on Ship Broker Insecurity: Not on file  Transportation Needs: Not on file  Physical Activity: Not on file  Stress: Not on file  Social Connections: Not on file  Intimate Partner Violence: Not on file      Family History  Problem Relation Age of Onset   Asthma Mother    Cancer Maternal Grandmother    Cancer Maternal Grandfather    Cancer Paternal Grandmother    Heart failure Maternal Uncle    Bradycardia Other        multiple family members with pacemakers   Heart failure  Maternal Uncle s/p Cardiac Transplant   Maternal Cousin s/p Cardiac Transplant       Vitals:   06/15/24 1056  BP: 100/60  Pulse: 74  SpO2: 99%    PHYSICAL EXAM: General: NAD Neck: JVP 8 cm with HJR, no thyromegaly or thyroid  nodule.  Lungs: Clear to auscultation bilaterally with normal respiratory effort. CV: Nondisplaced PMI.  Heart regular S1/S2, no S3/S4, no murmur.  1+ ankle edema.  No carotid bruit.  Normal pedal pulses.  Abdomen: Soft, nontender, no hepatosplenomegaly, no distention.  Skin: Intact without lesions or rashes.  Neurologic: Alert and oriented x 3.  Psych: Normal affect. Extremities: No clubbing or cyanosis.  HEENT: Normal.   ASSESSMENT & PLAN:  1. 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  Patient has had DCM associated with CHB and with atrial flutter.  Strong family history of cardiomyopathy and conduction disease/pacemaker placement.  Patient's mother also has LMNA gene mutation.  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 Patient has MDT CRT-D.  cMRI in 11/19 showed LV EF 44%, RV EF 62%, patchy mid-wall LGE in the basal septal and lateral walls.  cPET in 12/19 showed abnormal uptake in myocardium concerning for inflammatory process.  There was initial concern for cardiac sarcoidosis as a unifying diagnosis, but no sign of pulmonary sarcoidosis on CT chest and patient is positive for LMNA gene mutation.  The cMRI and cPET patterns that the patient had can be seen with genetic cardiomyopathies, often appearing to masquerade as cardiac sarcoidosis.  I think cardiac sarcoidosis is unlikely and would not immunosuppress.  Most recent echo in 4/25 showed EF 40-45%, mild RV dysfunction.  He is mildly volume overloaded on exam with NYHA class II symptoms.  He has struggled to tolerate GDMT, SBP generally  runs in 90s-100s, but seems to be tolerating current regimen well and BP stable today.  - Continue bisoprolol  10 mg qhs.   - He has tolerated valsartan  and could use some extra diuresis.  I will have him stop valsartan  and start Entresto  24/26 bid.  BMET/BNP today and BMET in 10 days.    - He has been taking Lasix  20 mg bid.  I will have him continue at this dose. He is mildly volume overloaded on exam but we are starting him on Entresto   which should give a bit of additional diuresis.  - Continue spironolactone  25 mg daily.  - Continue Farxiga  10 mg daily.   2. Atrial Flutter: Atypical, persistent. Dates back to at least 2012.  Flutter ablation attempt in 12/23 with Dr. Waddell showed simultaneous left and right atrial flutters.  Due to extensive scar tissue in atria, flutter ablation was not done and patient was cardioverted to NSR. Patient went back into atrial flutter in 11/24 and was unable to be cardioverted in 12/24. Atypical flutter ablation done in 5/25 by Dr. Sherrye.  He is a-paced today.  - Long-term Eliquis  with CVA history.   3. Rt MCA CVA: Recent MCA infarct due to right M1 occlusion 3/23. Likely secondary to atrial flutter and cardiomyopathy (LVEF 30-35%). Had not been anticoagulated. s/p mechanical thrombectomy by IR - Continue Eliquis  5 mg bid.  - Continue statin. Check lipids today.  4. CHB: MDT CRT-D.  Suspect related to LMNA cardiomyopathy as outlined above  5. H/o VT: Has ICD, followed by Dr. Nancey.  - no recent VT on device interrogation   Followup 3 months with APP  I spent 32 minutes reviewing chart, interacting with patient, and managing orders.   Ezra Shuck 06/15/2024

## 2024-06-17 ENCOUNTER — Telehealth: Payer: Self-pay | Admitting: Neurology

## 2024-06-17 NOTE — Telephone Encounter (Signed)
 Wife has called to Request to reschedule appointment w/ wait list for pt

## 2024-06-23 ENCOUNTER — Ambulatory Visit (HOSPITAL_COMMUNITY): Admission: RE | Admit: 2024-06-23 | Discharge: 2024-06-23 | Attending: Internal Medicine

## 2024-06-23 DIAGNOSIS — I5022 Chronic systolic (congestive) heart failure: Secondary | ICD-10-CM

## 2024-06-23 LAB — BASIC METABOLIC PANEL WITH GFR
Anion gap: 12 (ref 5–15)
BUN: 15 mg/dL (ref 6–20)
CO2: 22 mmol/L (ref 22–32)
Calcium: 9.3 mg/dL (ref 8.9–10.3)
Chloride: 104 mmol/L (ref 98–111)
Creatinine, Ser: 1.01 mg/dL (ref 0.61–1.24)
GFR, Estimated: >60 mL/min (ref >60)
Glucose, Bld: 96 mg/dL (ref 70–99)
Potassium: 4.7 mmol/L (ref 3.5–5.1)
Sodium: 138 mmol/L (ref 135–145)

## 2024-07-06 ENCOUNTER — Ambulatory Visit: Admitting: Neurology

## 2024-07-08 ENCOUNTER — Other Ambulatory Visit (HOSPITAL_COMMUNITY): Payer: Self-pay | Admitting: Cardiology

## 2024-07-17 ENCOUNTER — Other Ambulatory Visit (HOSPITAL_COMMUNITY): Payer: Self-pay | Admitting: Cardiology

## 2024-09-22 ENCOUNTER — Ambulatory Visit (HOSPITAL_COMMUNITY)

## 2024-12-02 ENCOUNTER — Ambulatory Visit

## 2025-03-03 ENCOUNTER — Ambulatory Visit

## 2025-04-15 ENCOUNTER — Ambulatory Visit: Admitting: Neurology

## 2025-06-02 ENCOUNTER — Ambulatory Visit

## 2025-09-01 ENCOUNTER — Ambulatory Visit

## 2025-12-01 ENCOUNTER — Ambulatory Visit

## 2026-03-02 ENCOUNTER — Ambulatory Visit

## 2026-06-01 ENCOUNTER — Ambulatory Visit

## 2026-08-31 ENCOUNTER — Ambulatory Visit
